# Patient Record
Sex: Female | Born: 1980 | Race: White | Hispanic: No | Smoking: Former smoker
Health system: Southern US, Community
[De-identification: ages and names within clinical notes are randomized; demographics above are authoritative.]

## PROBLEM LIST (undated history)

## (undated) DIAGNOSIS — N393 Stress incontinence (female) (male): Secondary | ICD-10-CM

## (undated) DIAGNOSIS — C801 Malignant (primary) neoplasm, unspecified: Secondary | ICD-10-CM

## (undated) DIAGNOSIS — F32A Depression, unspecified: Secondary | ICD-10-CM

## (undated) DIAGNOSIS — Z87442 Personal history of urinary calculi: Secondary | ICD-10-CM

## (undated) DIAGNOSIS — F329 Major depressive disorder, single episode, unspecified: Secondary | ICD-10-CM

## (undated) DIAGNOSIS — F419 Anxiety disorder, unspecified: Secondary | ICD-10-CM

## (undated) DIAGNOSIS — T884XXA Failed or difficult intubation, initial encounter: Secondary | ICD-10-CM

## (undated) DIAGNOSIS — N189 Chronic kidney disease, unspecified: Secondary | ICD-10-CM

## (undated) DIAGNOSIS — E063 Autoimmune thyroiditis: Secondary | ICD-10-CM

## (undated) DIAGNOSIS — G4730 Sleep apnea, unspecified: Secondary | ICD-10-CM

## (undated) DIAGNOSIS — Z8709 Personal history of other diseases of the respiratory system: Secondary | ICD-10-CM

## (undated) DIAGNOSIS — T4145XA Adverse effect of unspecified anesthetic, initial encounter: Secondary | ICD-10-CM

## (undated) DIAGNOSIS — K219 Gastro-esophageal reflux disease without esophagitis: Secondary | ICD-10-CM

## (undated) DIAGNOSIS — Z8701 Personal history of pneumonia (recurrent): Secondary | ICD-10-CM

## (undated) DIAGNOSIS — G43909 Migraine, unspecified, not intractable, without status migrainosus: Secondary | ICD-10-CM

## (undated) DIAGNOSIS — Z9989 Dependence on other enabling machines and devices: Secondary | ICD-10-CM

## (undated) DIAGNOSIS — T8859XA Other complications of anesthesia, initial encounter: Secondary | ICD-10-CM

## (undated) DIAGNOSIS — Z973 Presence of spectacles and contact lenses: Secondary | ICD-10-CM

## (undated) DIAGNOSIS — C73 Malignant neoplasm of thyroid gland: Secondary | ICD-10-CM

## (undated) DIAGNOSIS — G4733 Obstructive sleep apnea (adult) (pediatric): Secondary | ICD-10-CM

## (undated) DIAGNOSIS — E282 Polycystic ovarian syndrome: Secondary | ICD-10-CM

## (undated) DIAGNOSIS — T7840XA Allergy, unspecified, initial encounter: Secondary | ICD-10-CM

## (undated) HISTORY — DX: Depression, unspecified: F32.A

## (undated) HISTORY — DX: Obstructive sleep apnea (adult) (pediatric): G47.33

## (undated) HISTORY — DX: Chronic kidney disease, unspecified: N18.9

## (undated) HISTORY — DX: Allergy, unspecified, initial encounter: T78.40XA

## (undated) HISTORY — DX: Polycystic ovarian syndrome: E28.2

## (undated) HISTORY — DX: Personal history of pneumonia (recurrent): Z87.01

## (undated) HISTORY — DX: Migraine, unspecified, not intractable, without status migrainosus: G43.909

## (undated) HISTORY — DX: Major depressive disorder, single episode, unspecified: F32.9

## (undated) HISTORY — PX: EXTRACORPOREAL SHOCK WAVE LITHOTRIPSY: SHX1557

## (undated) HISTORY — PX: FRACTURE SURGERY: SHX138

## (undated) HISTORY — DX: Sleep apnea, unspecified: G47.30

## (undated) HISTORY — DX: Anxiety disorder, unspecified: F41.9

## (undated) HISTORY — DX: Malignant neoplasm of thyroid gland: C73

## (undated) HISTORY — DX: Autoimmune thyroiditis: E06.3

## (undated) HISTORY — DX: Dependence on other enabling machines and devices: Z99.89

## (undated) HISTORY — PX: TONSILECTOMY, ADENOIDECTOMY, BILATERAL MYRINGOTOMY AND TUBES: SHX2538

---

## 2001-05-26 DIAGNOSIS — E282 Polycystic ovarian syndrome: Secondary | ICD-10-CM

## 2001-05-26 HISTORY — DX: Polycystic ovarian syndrome: E28.2

## 2004-05-31 ENCOUNTER — Ambulatory Visit: Payer: Self-pay

## 2004-06-25 ENCOUNTER — Ambulatory Visit: Payer: Self-pay

## 2004-07-16 ENCOUNTER — Ambulatory Visit: Payer: Self-pay | Admitting: Specialist

## 2004-08-08 ENCOUNTER — Ambulatory Visit: Payer: Self-pay | Admitting: Specialist

## 2004-08-12 ENCOUNTER — Emergency Department: Payer: Self-pay | Admitting: Internal Medicine

## 2004-08-31 ENCOUNTER — Emergency Department: Payer: Self-pay | Admitting: Emergency Medicine

## 2004-09-07 ENCOUNTER — Emergency Department: Payer: Self-pay | Admitting: Unknown Physician Specialty

## 2004-11-05 ENCOUNTER — Ambulatory Visit: Payer: Self-pay

## 2005-02-06 ENCOUNTER — Ambulatory Visit: Payer: Self-pay | Admitting: Obstetrics & Gynecology

## 2005-02-18 ENCOUNTER — Ambulatory Visit: Payer: Self-pay | Admitting: Obstetrics & Gynecology

## 2005-04-08 ENCOUNTER — Observation Stay: Payer: Self-pay | Admitting: Unknown Physician Specialty

## 2005-04-14 ENCOUNTER — Inpatient Hospital Stay: Payer: Self-pay | Admitting: Obstetrics & Gynecology

## 2005-04-22 ENCOUNTER — Ambulatory Visit: Payer: Self-pay | Admitting: Pediatrics

## 2006-08-06 ENCOUNTER — Ambulatory Visit: Payer: Self-pay | Admitting: Allergy

## 2006-11-04 ENCOUNTER — Emergency Department: Payer: Self-pay | Admitting: Emergency Medicine

## 2007-10-14 ENCOUNTER — Ambulatory Visit: Payer: Self-pay | Admitting: Family Medicine

## 2007-12-28 ENCOUNTER — Ambulatory Visit: Payer: Self-pay | Admitting: Family Medicine

## 2008-03-05 ENCOUNTER — Emergency Department: Payer: Self-pay | Admitting: Emergency Medicine

## 2008-03-05 ENCOUNTER — Other Ambulatory Visit: Payer: Self-pay

## 2008-03-07 ENCOUNTER — Ambulatory Visit: Payer: Self-pay | Admitting: Emergency Medicine

## 2008-05-26 DIAGNOSIS — E063 Autoimmune thyroiditis: Secondary | ICD-10-CM

## 2008-05-26 HISTORY — DX: Autoimmune thyroiditis: E06.3

## 2009-02-26 ENCOUNTER — Ambulatory Visit: Payer: Self-pay | Admitting: Physician Assistant

## 2009-07-25 ENCOUNTER — Emergency Department: Payer: Self-pay | Admitting: Emergency Medicine

## 2010-01-16 DIAGNOSIS — K2100 Gastro-esophageal reflux disease with esophagitis, without bleeding: Secondary | ICD-10-CM | POA: Insufficient documentation

## 2010-02-18 DIAGNOSIS — Z8669 Personal history of other diseases of the nervous system and sense organs: Secondary | ICD-10-CM | POA: Insufficient documentation

## 2010-05-26 DIAGNOSIS — Z8701 Personal history of pneumonia (recurrent): Secondary | ICD-10-CM

## 2010-05-26 HISTORY — DX: Personal history of pneumonia (recurrent): Z87.01

## 2010-08-07 ENCOUNTER — Ambulatory Visit: Payer: Self-pay | Admitting: Family Medicine

## 2012-08-21 DIAGNOSIS — Z148 Genetic carrier of other disease: Secondary | ICD-10-CM | POA: Insufficient documentation

## 2012-10-01 ENCOUNTER — Emergency Department: Payer: Self-pay | Admitting: Emergency Medicine

## 2012-10-01 LAB — CBC
HCT: 36.2 % (ref 35.0–47.0)
HGB: 12.4 g/dL (ref 12.0–16.0)
MCH: 30.3 pg (ref 26.0–34.0)
MCHC: 34.3 g/dL (ref 32.0–36.0)
Platelet: 216 10*3/uL (ref 150–440)
RDW: 14.1 % (ref 11.5–14.5)
WBC: 9.6 10*3/uL (ref 3.6–11.0)

## 2012-10-01 LAB — URINALYSIS, COMPLETE
Bilirubin,UR: NEGATIVE
Blood: NEGATIVE
Nitrite: NEGATIVE
RBC,UR: 29 /HPF (ref 0–5)
Specific Gravity: 1.029 (ref 1.003–1.030)
Squamous Epithelial: 3

## 2012-10-01 LAB — US OB LIMITED

## 2012-10-01 LAB — GC/CHLAMYDIA PROBE AMP

## 2012-10-01 LAB — WET PREP, GENITAL

## 2012-10-03 LAB — URINE CULTURE

## 2012-12-31 DIAGNOSIS — K589 Irritable bowel syndrome without diarrhea: Secondary | ICD-10-CM | POA: Insufficient documentation

## 2013-03-03 DIAGNOSIS — F331 Major depressive disorder, recurrent, moderate: Secondary | ICD-10-CM | POA: Insufficient documentation

## 2013-07-25 ENCOUNTER — Ambulatory Visit (INDEPENDENT_AMBULATORY_CARE_PROVIDER_SITE_OTHER): Payer: Managed Care, Other (non HMO) | Admitting: Internal Medicine

## 2013-07-25 ENCOUNTER — Encounter: Payer: Self-pay | Admitting: Internal Medicine

## 2013-07-25 VITALS — BP 118/74 | HR 105 | Temp 99.1°F | Resp 12 | Ht 63.0 in | Wt 310.0 lb

## 2013-07-25 DIAGNOSIS — E282 Polycystic ovarian syndrome: Secondary | ICD-10-CM | POA: Insufficient documentation

## 2013-07-25 DIAGNOSIS — E038 Other specified hypothyroidism: Secondary | ICD-10-CM

## 2013-07-25 DIAGNOSIS — E063 Autoimmune thyroiditis: Secondary | ICD-10-CM

## 2013-07-25 DIAGNOSIS — E041 Nontoxic single thyroid nodule: Secondary | ICD-10-CM | POA: Insufficient documentation

## 2013-07-25 LAB — TSH: TSH: 0.41 u[IU]/mL (ref 0.35–5.50)

## 2013-07-25 LAB — T4, FREE: FREE T4: 1.03 ng/dL (ref 0.60–1.60)

## 2013-07-25 NOTE — Patient Instructions (Signed)
Please return in 6 months. Please stop at the lab. Please join MyChart >> I will send you the labs through there.

## 2013-07-25 NOTE — Progress Notes (Signed)
Patient ID: Linda Welch, female   DOB: [birth year], 33 y.o.   MRN: [id number]   HPI  Linda Welch is a 33 y.o.-year-old female, referred by her PCP, Dr. Rutherford Nail, for evaluation for Hashimoto's hypothyroidism and thyroid nodules. She has seen a previous endocrinologist (Dr Francoise Schaumann) >> would like to change to [facility].  Pt. has been dx with hypothyroidism in 2010; is on Synthroid 88 mcg, taken: - fasting - with water or black coffee - separated by >30 min from b'fast  - separated by >4h from calcium, iron, PPIs, multivitamins   She just had a baby 5 mo ago >> during the pregnancy, she was on Synthroid 112 mcg towards the end of the pregnancy (TSH 0.07) >> 112 >> 100 >> 88 mcg.  I reviewed pt's thyroid tests: 03/29/2013: TSH 0.93, fT4 1.03 02/04/2013 (towards end of pregnancy): TSH 0.12, fT4 0.96 >> Synthroid from 100 to 88 mcg 12/2012: TSH 0.07 >> Synthroid reduced from 112 to 100 mcg  She was first dx with a uninodular goiter in 02/2009 >> 3 cm nodule felt by pt >> seen by PCP >> referred to endo >> uptake and scan >> cold nodule >> FNA: benign (bad experience).   Pt is feeling her R thyroid nodule in neck, + hoarseness, no dysphagia/odynophagia, + SOB with lying down on R side.  Pt describes: - + cold intolerance, but also hot flushes (mainly face) >> not new  - weight gain - + fatigue - ongoing for a long time - + diarrhea / + constipation (has IBS) - no dry skin - + hair falling - post pregnancy - + depression/+ anxiety - not new - + palpitations - improved (<1x a month) - + mm aches and joint pain - shoulder  She does not breast feed.   She has + FH of thyroid disorders in: mother  -goiter. No FH of thyroid cancer.  No h/o radiation tx to head or neck. No recent use of iodine supplements.  ROS: Constitutional: see above Eyes: no blurry vision, no xerophthalmia ENT: + sore throat, + nodules palpated in throat, no dysphagia/odynophagia, + hoarseness Cardiovascular: no  CP/SOB/+ palpitations/no leg swelling Respiratory: no cough/SOB Gastrointestinal: no N/V/D/C Musculoskeletal: no muscle/joint aches Skin: no rashes Neurological: no tremors/numbness/tingling/dizziness, + HAs Psychiatric: + depression/+ anxiety  Past Medical History  Diagnosis Date  . PCOS (polycystic ovarian syndrome) 2003  . Sleep apnea, obstructive   . Depression   . Anxiety   . Allergy   . Hashimoto's thyroiditis 2010   Past Surgical History  Procedure Laterality Date  . Tonsilectomy, adenoidectomy, bilateral myringotomy and tubes      1984 and 1989   History   Social History  . Marital Status: Married    Spouse Name: N/A    Number of Children: 2   Occupational History  . florist   Social History Main Topics  . Smoking status: Quit in 2000  . Smokeless tobacco: No  . Alcohol Use: No  . Drug Use: No   Current Outpatient Rx  Name  Route  Sig  Dispense  Refill  . cetirizine (ZYRTEC) 10 MG tablet   Oral   Take 10 mg by mouth daily.         . metFORMIN (GLUCOPHAGE-XR) 750 MG 24 hr tablet   Oral   Take 750 mg by mouth daily with breakfast.          . montelukast (SINGULAIR) 10 MG tablet   Oral   Take 10 mg  by mouth at bedtime.          . sertraline (ZOLOFT) 50 MG tablet   Oral   Take 50 mg by mouth at bedtime.          Marland Kitchen SYNTHROID 88 MCG tablet   Oral   Take 88 mcg by mouth daily before breakfast.           NKDA  Family History  Problem Relation Age of Onset  . Thyroid disease Mother     thyroid goiter  . Depression Mother   . Hyperlipidemia Mother   . Hypertension Mother   . Heart disease Mother     triple bypass  . Heart disease Father   . Hyperlipidemia Father   . Hypertension Father   . Sleep apnea Father   . Asthma Brother   . Depression Brother   . Heart disease Maternal Grandmother   . Hyperlipidemia Maternal Grandmother   . Heart disease Maternal Grandfather   . Hyperlipidemia Maternal Grandfather   . Heart disease  Paternal Grandmother   . Hyperlipidemia Paternal Grandmother   . Heart disease Paternal Grandfather   . Hyperlipidemia Paternal Grandfather   . Diabetes Paternal Grandfather   . Sleep apnea Paternal Grandfather    PE: BP 118/74  Pulse 105  Temp(Src) 99.1 F (37.3 C) (Oral)  Resp 12  Ht 5\' 3"  (1.6 m)  Wt 310 lb (140.615 kg)  BMI 54.93 kg/m2  SpO2 97% Wt Readings from Last 3 Encounters:  07/25/13 310 lb (140.615 kg)   Constitutional: obese, in NAD Eyes: PERRLA, EOMI, no exophthalmos ENT: moist mucous membranes, + thyromegaly, + large R thyroid nodule, mobile, no cervical lymphadenopathy Cardiovascular: RRR, No MRG Respiratory: CTA B Gastrointestinal: abdomen soft, NT, ND, BS+ Musculoskeletal: no deformities, strength intact in all 4 Skin: moist, warm, no rashes Neurological: no tremor with outstretched hands, DTR normal in all 4  ASSESSMENT: 1. Hashimoto's Hypothyroidism  3. Uninodular goiter - 3 cm R "cold" nodule - s/p benign FNA (Dr Francoise Schaumann)  PLAN:  1. Hypothyroidism Patient with 5 year h/o hypothyroidism, on levothyroxine therapy. She appears euthyroid. - We discussed about correct intake of levothyroxine, fasting, with water, separated by at least 30 minutes from breakfast, and separated by more than 4 hours from calcium, iron, multivitamins, acid reflux medications (PPIs). She does take it correctly. - we'll check thyroid tests today: TSH, free T4 - If these are abnormal, she will need to return in 6-8 weeks for repeat labs - If these are normal, I will see her back in 6 months  2. Thyroid nodule - nodule is large and easily observed on exam - pt is not bothered by it and does not want surgery unless this absolutely needs to be done (e.g. If cancerous or if this is exacerbating her sleep apnea). - will get records of previous thyroid U/S and Uptake and scan - will get a new [place] - she had a bad experience with the previous FNA - very painful and could not move neck  for 3 days >> would like to avoid a new one if possible >> we decided to repeat it only if nodule larger or if appears different on U/S  Office Visit on 07/25/2013  Component Date Value Ref Range Status  . TSH 07/25/2013 0.41  0.35 - 5.50 uIU/mL Final  . Free T4 07/25/2013 1.03  0.60 - 1.60 ng/dL Final   Labs normal, continue current LT4 dose.  Received thyroid U/S report from [facility] (  02/26/2009):  R thyroid lobe: 4.8 x 2.5 x 2.6 cm, containing a 3.1 x 2.6 x 2.2 cm complex, slightly hypoechoic, nodule, situated in the mid pole of the right thyroid lobe.  L thyroid lobe: 4.2 x 1.6 x 1.3 cm. Normal echotexture.  Isthmus: 0.2 cm  08/12/2013 Thyroid U/S for 2015 still pending. I will addend the results when they become available.

## 2013-07-25 NOTE — Progress Notes (Deleted)
Patient ID: Linda Welch, female   DOB: 11-Dec-1980, 33 y.o.   MRN: [id number]  HPI: Linda Welch is a 33 y.o.-year-old female, referred by her PCP, Dr. Derrek Monaco, for management of DM2, non-insulin-dependent, uncontrolled, without complications.  Patient has been diagnosed with diabetes in ***; she has not been on insulin before. Last hemoglobin A1c was: No results found for this basename: HGBA1C    Pt is on a regimen of: - Metformin 1000 mg po bid - Lantus *** units qhs - Novolog *** units tid ac  Pt checks her sugars *** a day and they are: - am:  - 2h after b'fast: - before lunch: - 2h after lunch: - before dinner: - 2h after dinner: - bedtime: - nighttime:  No lows. Lowest sugar was ***; she has hypoglycemia awareness at 70.  Highest sugar was ***.  Pt's meals are: - Breakfast: - Lunch: - Dinner: - Snacks:  - no CKD, last BUN/creatinine:  No results found for this basename: bun, creatinine   - last set of lipids: No results found for this basename: CHOL, HDL, LDLCALC, LDLDIRECT, TRIG, CHOLHDL   - last eye exam was in ***. No DR.  - no numbness and tingling in her feet.  I reviewed her chart and she also has a history of ***.  Pt has FH of DM in ***.  ROS: Constitutional: no weight gain/loss, no fatigue, no subjective hyperthermia/hypothermia Eyes: no blurry vision, no xerophthalmia ENT: no sore throat, no nodules palpated in throat, no dysphagia/odynophagia, no hoarseness Cardiovascular: no CP/SOB/palpitations/leg swelling Respiratory: no cough/SOB Gastrointestinal: no N/V/D/C Musculoskeletal: no muscle/joint aches Skin: no rashes Neurological: no tremors/numbness/tingling/dizziness Psychiatric: no depression/anxiety  PE: BP 118/74  Pulse 105  Temp(Src) 99.1 F (37.3 C) (Oral)  Resp 12  Ht 5\' 3"  (1.6 m)  Wt 310 lb (140.615 kg)  BMI 54.93 kg/m2  SpO2 97% Wt Readings from Last 3 Encounters:  07/25/13 310 lb (140.615 kg)   Constitutional:  overweight, in NAD Eyes: PERRLA, EOMI, no exophthalmos ENT: moist mucous membranes, no thyromegaly, no cervical lymphadenopathy Cardiovascular: RRR, No MRG Respiratory: CTA B Gastrointestinal: abdomen soft, NT, ND, BS+ Musculoskeletal: no deformities, strength intact in all 4 Skin: moist, warm, no rashes Neurological: no tremor with outstretched hands, DTR normal in all 4  ASSESSMENT: 1. DM2, non-insulin-dependent, uncontrolled, without complications  PLAN:  1. Patient with long-standing, recently more uncontrolled diabetes, on oral antidiabetic regimen, which became insufficient - We discussed about options for treatment, and I suggested to:  There are no Patient Instructions on file for this visit. - Strongly advised her to start checking sugars at different times of the day - check 2 times a day, rotating checks - given sugar log and advised how to fill it and to bring it at next appt  - given foot care handout and explained the principles  - given instructions for hypoglycemia management "15-15 rule"  - advised for yearly eye exams - Return to clinic in 1 mo with sugar log

## 2013-08-12 ENCOUNTER — Encounter: Payer: Self-pay | Admitting: Internal Medicine

## 2014-01-23 ENCOUNTER — Encounter: Payer: Self-pay | Admitting: Internal Medicine

## 2014-01-23 ENCOUNTER — Ambulatory Visit (INDEPENDENT_AMBULATORY_CARE_PROVIDER_SITE_OTHER): Payer: Managed Care, Other (non HMO) | Admitting: Internal Medicine

## 2014-01-23 ENCOUNTER — Other Ambulatory Visit: Payer: Self-pay | Admitting: *Deleted

## 2014-01-23 ENCOUNTER — Other Ambulatory Visit: Payer: Self-pay | Admitting: Internal Medicine

## 2014-01-23 VITALS — BP 112/62 | HR 94 | Temp 98.4°F | Resp 12 | Wt 336.0 lb

## 2014-01-23 DIAGNOSIS — E063 Autoimmune thyroiditis: Secondary | ICD-10-CM

## 2014-01-23 DIAGNOSIS — E038 Other specified hypothyroidism: Secondary | ICD-10-CM

## 2014-01-23 DIAGNOSIS — E041 Nontoxic single thyroid nodule: Secondary | ICD-10-CM

## 2014-01-23 LAB — TSH: TSH: 0.18 u[IU]/mL — AB (ref 0.35–4.50)

## 2014-01-23 LAB — T4, FREE: FREE T4: 0.92 ng/dL (ref 0.60–1.60)

## 2014-01-23 NOTE — Patient Instructions (Signed)
Please stop at the lab. You will be called with the appt for the ultrasound. Please come back for a follow-up appointment in 6 months.

## 2014-01-23 NOTE — Progress Notes (Addendum)
Patient ID: Linda Welch, female   DOB: 05-Aug-1980, 33 y.o.   MRN: [id number]  HPI  Linda Welch is a 33 y.o.-year-old female, returning for Hashimoto's hypothyroidism and thyroid nodules. She is here with her husband and her son. Last visit 6 mo ago.  Hypothyroidism: Pt. has been dx with hypothyroidism in 2010; is on Synthroid 88 mcg, taken: - fasting - with water or black coffee - separated by >30 min from b'fast  - separated by >4h from calcium, iron, PPIs, multivitamins   During the pregnancy, she was on Synthroid  DAW 112 mcg towards the end of the pregnancy (TSH 0.07) >> 112 >> 100 >> 88 mcg.  I reviewed pt's thyroid tests: Lab Results  Component Value Date   TSH 0.41 07/25/2013   FREET4 1.03 07/25/2013  03/29/2013: TSH 0.93, fT4 1.03 02/04/2013 (towards end of pregnancy): TSH 0.12, fT4 0.96 >> Synthroid reduced from 100 to 88 mcg 12/2012: TSH 0.07 >> Synthroid reduced from 112 to 100 mcg  Pt describes: - + cold intolerance, but also hot flushes (mainly face) >> not new  - weight gain - + fatigue - ongoing for a long time - + diarrhea  - no dry skin - + hair falling - post pregnancy - +++ depression/+ anxiety - depression worse - resolved palpitations  - + mm aches and joint pain - shoulder  Uninodular goiter  Reviewed hx: - dx in in 02/2009 >> 3 cm nodule felt by pt >> seen by PCP >> referred to endo >> uptake and scan >> cold nodule >> FNA by Dr Francoise Schaumann: benign (bad experience).  - last thyroid U/S was from 2010: 3.1 x 2.6 x 2.2 cm complex, slightly hypoechoic, nodule, situated in the mid pole of the right thyroid lobe.  She missed the appt for the U/S ordered at last visit.  Pt is feeling her R thyroid nodule in neck, + hoarseness, no dysphagia/odynophagia, no SOB.  ROS: Constitutional: see HPI Eyes: + blurry vision, no xerophthalmia ENT: + sore throat, + nodules palpated in throat, no dysphagia/odynophagia, + hoarseness Cardiovascular: no CP/SOB/+  palpitations/no leg swelling Respiratory: no cough/SOB Gastrointestinal: no N/V/+D/no C Musculoskeletal: + both: muscle/joint aches Skin: no rashes, + itching, + hair loss Neurological: no tremors/numbness/tingling/dizziness, + HAs  PE: BP 112/62  Pulse 94  Temp(Src) 98.4 F (36.9 C) (Oral)  Resp 12  Wt 336 lb (152.409 kg)  SpO2 97% Wt Readings from Last 3 Encounters:  01/23/14 336 lb (152.409 kg)  07/25/13 310 lb (140.615 kg)   Constitutional: obese, in NAD Eyes: PERRLA, EOMI, no exophthalmos ENT: moist mucous membranes, + thyromegaly R>L, + large R thyroid nodule, mobile, no cervical lymphadenopathy Cardiovascular: RRR, No MRG Respiratory: CTA B Gastrointestinal: abdomen soft, NT, ND, BS+ Musculoskeletal: no deformities, strength intact in all 4 Skin: moist, warm, no rashes Neurological: no tremor with outstretched hands, DTR normal in all 4  ASSESSMENT: 1. Hashimoto's Hypothyroidism  3. Uninodular goiter - 3 cm R "cold" nodule - thyroid U/S report from [facility] (02/26/2009):  R thyroid lobe: 4.8 x 2.5 x 2.6 cm, containing a 3.1 x 2.6 x 2.2 cm complex, slightly hypoechoic, nodule, situated in the mid pole of the right thyroid lobe.  L thyroid lobe: 4.2 x 1.6 x 1.3 cm. Normal echotexture. Isthmus: 0.2 cm - s/p benign FNA (Dr Francoise Schaumann) - she had a bad experience with the FNA - very painful and could not move neck for 3 days   PLAN:  1. Hypothyroidism  Patient with 5 year h/o hypothyroidism, on levothyroxine (LT4) therapy. She appears euthyroid. - We discussed about correct intake of levothyroxine, fasting, with water, separated by at least 30 minutes from breakfast, and separated by more than 4 hours from calcium, iron, multivitamins, acid reflux medications (PPIs). She does take it correctly. - we'll check thyroid tests today: TSH, free T4 - If these are abnormal, she will need to return in 6-8 weeks for repeat labs - If these are normal, I will see her back  in 6 months - needs refill of Synthroid  2. Thyroid nodule - nodule is large and easily observed on exam - pt feels this may have enlarged, but is not bothered by it and does not want surgery unless this absolutely needs to be done  - we tried to obtain records of the previous FNA by Dr Francoise Schaumann, but could not - at last visit, we discussed to get a new [place] >> we decided to repeat Bx only if nodule larger or if appears different on U/S. She missed her appt in radiology, but agrees to have it rescheduled >> will order again.   Office Visit on 01/23/2014  Component Date Value Ref Range Status  . TSH 01/23/2014 0.18* 0.35 - 4.50 uIU/mL Final  . Free T4 01/23/2014 0.92  0.60 - 1.60 ng/dL Final   Will advise pt to skip LT4 1 day a week >> RTC for labs in 6 weeks.  U/S pending >> I will addend the results when they become available.  CLINICAL DATA: Followup nodule  EXAM: THYROID ULTRASOUND  TECHNIQUE: Ultrasound examination of the thyroid gland and adjacent soft tissues was performed.  COMPARISON: 02/26/2009 report only  FINDINGS: Right thyroid lobe  Measurements: 5.4 x 2.7 x 4.1 cm. Large nodule occupying most of the right lobe measures 5.2 x 3.2 x 4.1 cm. There are some internal calcifications. It previously measured 3.1 x 2.6 x 2.2 cm based on the prior report.  Left thyroid lobe  Measurements: 5.1 x 1.4 x 1.7 cm. No nodules visualized.  Isthmus  Thickness: 5 mm. No nodules visualized.  Lymphadenopathy  None visualized.  IMPRESSION: Enlarging right lobe nodule. Findings meet consensus criteria for biopsy. Ultrasound-guided fine needle aspiration should be considered, as per the consensus statement: Management of Thyroid Nodules Detected at [place]: Society of Radiologists in [place]. Radiology 2005; [id number].   Electronically Signed By: Maryclare Bean M.D. On: 02/06/2014 17:02  Nodule larger than in 2010 >> will suggest Bx. Will  order.  Adequacy Reason Satisfactory For Evaluation. 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 COMMENT: THE CASE WAS REVIEWED WITH DR Saralyn Pilar, WHO CONCURS. Mali RUND DO Pathologist, Electronic Signature (Case signed 03/03/2014) Specimen Clinical Information Followup nodule, Prev bx 5 yrs ago of same nodule in [place], Large nodule occupying most of the rt lobe 5.2 x 3.2 x 4.1cm Source Thyroid, Fine Needle Aspiration, Right, (Specimen 1 of 1, collected on 03/02/2014)  Will d/w pt to refer to Sx for hemi +/- total thyroidectomy.

## 2014-01-27 ENCOUNTER — Encounter: Payer: Self-pay | Admitting: Internal Medicine

## 2014-01-27 MED ORDER — LEVOTHYROXINE SODIUM 88 MCG PO TABS: 88.0000 ug | ORAL_TABLET | Freq: Every day | ORAL | Status: DC

## 2014-02-06 ENCOUNTER — Other Ambulatory Visit: Payer: Self-pay | Admitting: Internal Medicine

## 2014-02-06 ENCOUNTER — Ambulatory Visit
Admission: RE | Admit: 2014-02-06 | Discharge: 2014-02-06 | Disposition: A | Discharge status: Home / Self Care | Payer: Managed Care, Other (non HMO) | Source: Ambulatory Visit | Attending: Internal Medicine | Admitting: Internal Medicine

## 2014-02-06 DIAGNOSIS — E041 Nontoxic single thyroid nodule: Secondary | ICD-10-CM

## 2014-02-06 NOTE — Addendum Note (Signed)
Addended by: Philemon Kingdom on: 02/06/2014 06:03 PM   Modules accepted: Orders

## 2014-03-02 ENCOUNTER — Other Ambulatory Visit (HOSPITAL_COMMUNITY)
Admission: RE | Admit: 2014-03-02 | Discharge: 2014-03-02 | Disposition: A | Discharge status: Home / Self Care | Payer: Managed Care, Other (non HMO) | Source: Ambulatory Visit | Attending: Interventional Radiology | Admitting: Interventional Radiology

## 2014-03-02 ENCOUNTER — Ambulatory Visit
Admission: RE | Admit: 2014-03-02 | Discharge: 2014-03-02 | Disposition: A | Discharge status: Home / Self Care | Payer: Managed Care, Other (non HMO) | Source: Ambulatory Visit | Attending: Internal Medicine | Admitting: Internal Medicine

## 2014-03-02 DIAGNOSIS — E041 Nontoxic single thyroid nodule: Secondary | ICD-10-CM | POA: Diagnosis present

## 2014-03-06 ENCOUNTER — Other Ambulatory Visit: Payer: Managed Care, Other (non HMO)

## 2014-03-06 ENCOUNTER — Encounter: Payer: Self-pay | Admitting: Internal Medicine

## 2014-03-06 NOTE — Addendum Note (Signed)
Addended by: Philemon Kingdom on: 03/06/2014 04:39 PM   Modules accepted: Orders

## 2014-03-13 ENCOUNTER — Other Ambulatory Visit: Payer: Managed Care, Other (non HMO)

## 2014-03-27 ENCOUNTER — Ambulatory Visit (INDEPENDENT_AMBULATORY_CARE_PROVIDER_SITE_OTHER): Payer: Self-pay | Admitting: Surgery

## 2014-04-04 ENCOUNTER — Encounter: Payer: Self-pay | Admitting: Internal Medicine

## 2014-04-04 ENCOUNTER — Other Ambulatory Visit: Payer: Self-pay | Admitting: *Deleted

## 2014-04-04 MED ORDER — LEVOTHYROXINE SODIUM 88 MCG PO TABS: 88.0000 ug | ORAL_TABLET | Freq: Every day | ORAL | Status: DC

## 2014-05-10 ENCOUNTER — Encounter (HOSPITAL_COMMUNITY): Payer: Self-pay

## 2014-05-11 ENCOUNTER — Ambulatory Visit (HOSPITAL_COMMUNITY)
Admission: RE | Admit: 2014-05-11 | Discharge: 2014-05-11 | Disposition: A | Discharge status: Home / Self Care | Payer: Managed Care, Other (non HMO) | Source: Ambulatory Visit | Attending: Anesthesiology | Admitting: Anesthesiology

## 2014-05-11 ENCOUNTER — Encounter (HOSPITAL_COMMUNITY): Payer: Self-pay

## 2014-05-11 ENCOUNTER — Encounter (HOSPITAL_COMMUNITY)
Admission: RE | Admit: 2014-05-11 | Discharge: 2014-05-11 | Disposition: A | Discharge status: Home / Self Care | Payer: Managed Care, Other (non HMO) | Source: Ambulatory Visit | Attending: Surgery | Admitting: Surgery

## 2014-05-11 DIAGNOSIS — Z01818 Encounter for other preprocedural examination: Secondary | ICD-10-CM | POA: Diagnosis not present

## 2014-05-11 DIAGNOSIS — Z87891 Personal history of nicotine dependence: Secondary | ICD-10-CM | POA: Diagnosis not present

## 2014-05-11 DIAGNOSIS — Z01812 Encounter for preprocedural laboratory examination: Secondary | ICD-10-CM | POA: Insufficient documentation

## 2014-05-11 DIAGNOSIS — D34 Benign neoplasm of thyroid gland: Secondary | ICD-10-CM

## 2014-05-11 HISTORY — DX: Personal history of urinary calculi: Z87.442

## 2014-05-11 LAB — HCG, SERUM, QUALITATIVE: Preg, Serum: NEGATIVE

## 2014-05-11 LAB — CBC
HCT: 41.1 % (ref 36.0–46.0)
Hemoglobin: 12.9 g/dL (ref 12.0–15.0)
MCH: 28.5 pg (ref 26.0–34.0)
MCHC: 31.4 g/dL (ref 30.0–36.0)
MCV: 90.7 fL (ref 78.0–100.0)
PLATELETS: 286 10*3/uL (ref 150–400)
RBC: 4.53 MIL/uL (ref 3.87–5.11)
RDW: 14.4 % (ref 11.5–15.5)
WBC: 6.4 10*3/uL (ref 4.0–10.5)

## 2014-05-11 LAB — BASIC METABOLIC PANEL
Anion gap: 13 (ref 5–15)
BUN: 11 mg/dL (ref 6–23)
CALCIUM: 9.1 mg/dL (ref 8.4–10.5)
CO2: 25 mEq/L (ref 19–32)
Chloride: 100 mEq/L (ref 96–112)
Creatinine, Ser: 0.58 mg/dL (ref 0.50–1.10)
GFR calc Af Amer: >90 mL/min (ref >90)
Glucose, Bld: 101 mg/dL — ABNORMAL HIGH (ref 70–99)
Potassium: 4.4 mEq/L (ref 3.7–5.3)
Sodium: 138 mEq/L (ref 137–147)

## 2014-05-11 NOTE — Progress Notes (Signed)
Quick Note:  These results are acceptable for scheduled surgery.  Madeliene Tejera M. Ciara Kagan, MD, FACS [facility] Office: [phone]   ______ 

## 2014-05-11 NOTE — Pre-Procedure Instructions (Signed)
05-11-14 CXR done today. 0930 AM -  Dr. Lissa Hoard in to see for preop anesthesia consult. Baribed requested "Amy" Portable equipment.

## 2014-05-11 NOTE — Patient Instructions (Signed)
[place]  05/11/2014   Your procedure is scheduled on:   05-16-2014 Tuesday  Enter through [facility] Entrance and follow signs to [facility]. Arrive at   0700     AM.  Call this number if you have problems the morning of surgery: [phone]  Or Presurgical Testing [phone].   For Living Will and/or Health Care Power Attorney Forms: please provide copy for your medical record,may bring AM of surgery(Forms should be already notarized -we do not provide this service).(05-11-14  No information preferred today).  For Cpap use: Bring mask and tubing only.   Do not eat food/ or drink: After Midnight.      Take these medicines the morning of surgery with A SIP OF WATER: Levothyroxine.   Do not wear jewelry, make-up or nail polish.  Do not wear deodorant, lotions, powders, or perfumes.   Do not shave legs and under arms- 48 hours(2 days) prior to first CHG shower.(Shaving face and neck okay.)  Do not bring valuables to the hospital.(Hospital is not responsible for lost valuables).  Contacts, dentures or removable bridgework, body piercing, hair pins may not be worn into surgery.  Leave suitcase in the car. After surgery it may be brought to your room.  For patients admitted to the hospital, checkout time is 11:00 AM the day of discharge.(Restricted visitors-Any Persons displaying flu-like symptoms or illness).    Patients discharged the day of surgery will not be allowed to drive home. Must have responsible person with you x 24 hours once discharged.  Name and phone number of your driver: Lennette Bihari spouse [phone] cell     Please read over the following fact sheets that you were given:  CHG(Chlorhexidine Gluconate 4% Surgical Soap) use.         [facility] - Preparing for Surgery Before surgery, you can play an important role.  Because skin is not sterile, your skin needs to be as free of germs as possible.  You can reduce the number of germs on your skin  by washing with CHG (chlorahexidine gluconate) soap before surgery.  CHG is an antiseptic cleaner which kills germs and bonds with the skin to continue killing germs even after washing. Please DO NOT use if you have an allergy to CHG or antibacterial soaps.  If your skin becomes reddened/irritated stop using the CHG and inform your nurse when you arrive at Short Stay. Do not shave (including legs and underarms) for at least 48 hours prior to the first CHG shower.  You may shave your face/neck. Please follow these instructions carefully:  1.  Shower with CHG Soap the night before surgery and the  morning of Surgery.  2.  If you choose to wash your hair, wash your hair first as usual with your  normal  shampoo.  3.  After you shampoo, rinse your hair and body thoroughly to remove the  shampoo.                           4.  Use CHG as you would any other liquid soap.  You can apply chg directly  to the skin and wash                       Gently with a scrungie or clean washcloth.  5.  Apply the CHG Soap to your body ONLY FROM THE NECK DOWN.   Do  not use on face/ open                           Wound or open sores. Avoid contact with eyes, ears mouth and genitals (private parts).                       Wash face,  Genitals (private parts) with your normal soap.             6.  Wash thoroughly, paying special attention to the area where your surgery  will be performed.  7.  Thoroughly rinse your body with warm water from the neck down.  8.  DO NOT shower/wash with your normal soap after using and rinsing off  the CHG Soap.                9.  Pat yourself dry with a clean towel.            10.  Wear clean pajamas.            11.  Place clean sheets on your bed the night of your first shower and do not  sleep with pets. Day of Surgery : Do not apply any lotions/deodorants the morning of surgery.  Please wear clean clothes to the hospital/surgery center.  FAILURE TO FOLLOW THESE INSTRUCTIONS MAY RESULT IN  THE CANCELLATION OF YOUR SURGERY PATIENT SIGNATURE_________________________________  NURSE SIGNATURE__________________________________  ________________________________________________________________________

## 2014-05-11 NOTE — Anesthesia Preprocedure Evaluation (Signed)
Anesthesia Evaluation  Patient identified by MRN, date of birth, ID band Patient awake    Reviewed: Allergy & Precautions, H&P , NPO status , Patient's Chart, lab work & pertinent test results  Airway Mallampati: II  TM Distance: >3 FB Neck ROM: Limited    Dental no notable dental hx. (+) Implants, Caps   Pulmonary sleep apnea , former smoker,  breath sounds clear to auscultation  Pulmonary exam normal       Cardiovascular negative cardio ROS  Rhythm:Regular Rate:Normal     Neuro/Psych  Headaches, PSYCHIATRIC DISORDERS Anxiety Depression negative neurological ROS     GI/Hepatic negative GI ROS, Neg liver ROS,   Endo/Other  Hypothyroidism Morbid obesity  Renal/GU negative Renal ROS     Musculoskeletal negative musculoskeletal ROS (+)   Abdominal   Peds  Hematology negative hematology ROS (+)   Anesthesia Other Findings   Reproductive/Obstetrics negative OB ROS                             Anesthesia Physical Anesthesia Plan  ASA: III  Anesthesia Plan: General   Post-op Pain Management:    Induction: Intravenous  Airway Management Planned: Oral ETT  Additional Equipment: None  Intra-op Plan:   Post-operative Plan: Extubation in OR  Informed Consent: I have reviewed the patients History and Physical, chart, labs and discussed the procedure including the risks, benefits and alternatives for the proposed anesthesia with the patient or authorized representative who has indicated his/her understanding and acceptance.   Dental advisory given  Plan Discussed with: CRNA  Anesthesia Plan Comments:         Anesthesia Quick Evaluation

## 2014-05-11 NOTE — Progress Notes (Signed)
Quick Note:  Pre-operative chest x-ray is acceptable for scheduled surgery.  Esparanza Krider M. Philana Younis, MD, FACS [facility] Office: [phone]   ______ 

## 2014-05-15 ENCOUNTER — Encounter (HOSPITAL_COMMUNITY): Payer: Self-pay | Admitting: Surgery

## 2014-05-15 MED ORDER — CEFAZOLIN SODIUM 10 G IJ SOLR
3.0000 g | INTRAMUSCULAR | Status: AC
  Administered 2014-05-16: 3 g via INTRAVENOUS
  Filled 2014-05-15 (×2): qty 3000

## 2014-05-15 NOTE — H&P (Signed)
General Surgery [facility]  Linda Welch DOB: August 03, 1980 Married / Language: English / Race: White Female  History of Present Illness  Patient words: consult. thyroid sx.  The patient is a 33 year old female who presents with a thyroid nodule. Patient is referred by Dr. Philemon Kingdom for evaluation of dominant right thyroid mass with cytologic atypia.  Patient first noted a right thyroid mass approximately 5 years ago on self-examination. She presented to her primary care physician and was followed by an endocrinologist in [place]. Patient underwent fine needle aspiration biopsy of was told that she had Hashimoto's thyroiditis. Over the past 5 years she has had progressive enlargement of the right-sided thyroid nodule. She has been on Synthroid for 5 years although her overall dosage has been decreased. Patient recently changed endocrinologist. Ultrasound performed February 06, 2014 shows a dominant nodule in the right thyroid lobe measuring 5.2 x 3.2 x 4.1 cm and containing internal calcifications. This has significantly enlarged since her prior studies. Left thyroid lobe is normal in size without visualized nodules. Fine-needle aspiration biopsy was performed on March 02, 2014. 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  Patient has no prior history of head or neck surgery. Patient has been on thyroid medication for the past 5 years. There is a family history of thyroid goiter in the patient's mother. There is no family history of other endocrine neoplasm.  Patient does have obstructive sleep apnea. She does note pressure sensation from the right neck and is unable to sleep on her right side.    Other Problems Anxiety Disorder Depression Kidney Stone Migraine Headache Other disease, cancer, significant illness Sleep Apnea Thyroid Disease  Past Surgical History  Oral Surgery Tonsillectomy  Diagnostic  Studies History Colonoscopy never Mammogram >3 years ago Pap Smear 1-5 years ago  Allergies Adhesive Tape *MEDICAL DEVICES* Tioconazole Vaginal *VAGINAL PRODUCTS*  Medication History Montelukast Sodium (10MG  Tablet, Oral) Active. Synthroid (88MCG Tablet, Oral) Active. Sertraline HCl (50MG  Tablet, Oral) Active.  Social History  Alcohol use Remotely quit alcohol use. Caffeine use Coffee, Tea. No drug use Tobacco use Former smoker.  Family History Bleeding disorder Son. Depression Brother, Father, Mother. Heart Disease Father, Mother. Heart disease in female family member before age 38 Heart disease in female family member before age 13 Hypertension Father, Mother. Migraine Headache Mother. Respiratory Condition Mother. Thyroid problems Mother.  Pregnancy / Birth History Age at menarche 52 years. Gravida 2 Irregular periods Maternal age 51-25 Para 2  Review of Systems  General Present- Fatigue and Weight Gain. Not Present- Appetite Loss, Chills, Fever, Night Sweats and Weight Loss. Skin Not Present- Change in Wart/Mole, Dryness, Hives, Jaundice, New Lesions, Non-Healing Wounds, Rash and Ulcer. HEENT Present- Hoarseness, Seasonal Allergies, Sinus Pain and Wears glasses/contact lenses. Not Present- Earache, Hearing Loss, Nose Bleed, Oral Ulcers, Ringing in the Ears, Sore Throat, Visual Disturbances and Yellow Eyes. Respiratory Present- Snoring. Not Present- Bloody sputum, Chronic Cough, Difficulty Breathing and Wheezing. Breast Not Present- Breast Mass, Breast Pain, Nipple Discharge and Skin Changes. Cardiovascular Not Present- Chest Pain, Difficulty Breathing Lying Down, Leg Cramps, Palpitations, Rapid Heart Rate, Shortness of Breath and Swelling of Extremities. Gastrointestinal Not Present- Abdominal Pain, Bloating, Bloody Stool, Change in Bowel Habits, Chronic diarrhea, Constipation, Difficulty Swallowing, Excessive gas, Gets full quickly at meals,  Hemorrhoids, Indigestion, Nausea, Rectal Pain and Vomiting. Female Genitourinary Not Present- Frequency, Nocturia, Painful Urination, Pelvic Pain and Urgency. Neurological Present- Headaches and Tingling. Not Present- Decreased Memory,  Fainting, Numbness, Seizures, Tremor, Trouble walking and Weakness. Psychiatric Present- Anxiety and Depression. Not Present- Bipolar, Change in Sleep Pattern, Fearful and Frequent crying. Endocrine Not Present- Cold Intolerance, Excessive Hunger, Hair Changes, Heat Intolerance, Hot flashes and New Diabetes. Hematology Not Present- Easy Bruising, Excessive bleeding, Gland problems, HIV and Persistent Infections.   Vitals Weight: 340 lb Height: 63in Body Surface Area: 2.62 m Body Mass Index: 60.23 kg/m Temp.: 98.20F  Pulse: 88 (Regular)  BP: 140/80 (Sitting, Left Arm, Standard)    Physical Exam The physical exam findings are as follows: Note:General - appears comfortable, no distress; not diaphorectic  HEENT - normocephalic; sclerae clear, gaze conjugate; mucous membranes moist, dentition good; voice normal  Neck - asymmetric on extension; no palpable anterior or posterior cervical adenopathy; Visibly there is a dominant mass occupying the right thyroid lobe. On palpation there is a smooth mass occupying almost the entire right thyroid lobe measuring at least 5 cm in diameter. This is nontender. There is slight tracheal deviation to the left. 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 We reviewed her ultrasound and her  cytopathology report. I provided her with written literature on thyroid surgery to review at home.  Patient has a dominant mass occupying the right thyroid lobe with significant cytologic atypia on fine needle aspiration biopsy. Mass has significantly enlarged over the past 5 years. There are calcifications present. I believe her risk of malignancy is at least 30%.  I have recommended right thyroid lobectomy for definitive diagnosis. We discussed the possibility of completion thyroidectomy in the event of malignancy. We discussed the risk and benefits of the procedure including the potential for recurrent laryngeal nerve injury and injury to parathyroid glands. We discussed the surgical procedure, the hospital stay, and the postoperative recovery. We discussed the possible need for radioactive iodine treatment in the event of malignancy. Patient understands and wishes to proceed with surgery in the near future.  The risks and benefits of the procedure have been discussed at length with the patient. The patient understands the proposed procedure, potential alternative treatments, and the course of recovery to be expected. All of the patient's questions have been answered at this time. The patient wishes to proceed with surgery.  Earnstine Regal, MD, [facility] Office: [phone]

## 2014-05-16 ENCOUNTER — Encounter (HOSPITAL_COMMUNITY): Payer: Self-pay | Admitting: *Deleted

## 2014-05-16 ENCOUNTER — Observation Stay (HOSPITAL_COMMUNITY)
Admission: RE | Admit: 2014-05-16 | Discharge: 2014-05-17 | Disposition: A | Discharge status: Home / Self Care | Payer: Managed Care, Other (non HMO) | Source: Ambulatory Visit | Attending: Surgery | Admitting: Surgery

## 2014-05-16 ENCOUNTER — Ambulatory Visit (HOSPITAL_COMMUNITY): Payer: Managed Care, Other (non HMO) | Admitting: Anesthesiology

## 2014-05-16 ENCOUNTER — Encounter (HOSPITAL_COMMUNITY)
Admission: RE | Disposition: A | Discharge status: Home / Self Care | Payer: Self-pay | Source: Ambulatory Visit | Attending: Surgery

## 2014-05-16 DIAGNOSIS — Z87442 Personal history of urinary calculi: Secondary | ICD-10-CM | POA: Insufficient documentation

## 2014-05-16 DIAGNOSIS — G4733 Obstructive sleep apnea (adult) (pediatric): Secondary | ICD-10-CM | POA: Insufficient documentation

## 2014-05-16 DIAGNOSIS — D440 Neoplasm of uncertain behavior of thyroid gland: Secondary | ICD-10-CM | POA: Diagnosis present

## 2014-05-16 DIAGNOSIS — Z87891 Personal history of nicotine dependence: Secondary | ICD-10-CM | POA: Diagnosis not present

## 2014-05-16 DIAGNOSIS — G43909 Migraine, unspecified, not intractable, without status migrainosus: Secondary | ICD-10-CM | POA: Diagnosis not present

## 2014-05-16 DIAGNOSIS — F419 Anxiety disorder, unspecified: Secondary | ICD-10-CM | POA: Insufficient documentation

## 2014-05-16 DIAGNOSIS — C73 Malignant neoplasm of thyroid gland: Secondary | ICD-10-CM | POA: Diagnosis not present

## 2014-05-16 DIAGNOSIS — F329 Major depressive disorder, single episode, unspecified: Secondary | ICD-10-CM | POA: Insufficient documentation

## 2014-05-16 HISTORY — PX: THYROID LOBECTOMY: SHX420

## 2014-05-16 SURGERY — LOBECTOMY, THYROID
Anesthesia: General | Site: Abdomen | Laterality: Right

## 2014-05-16 MED ORDER — ROCURONIUM BROMIDE 100 MG/10ML IV SOLN
INTRAVENOUS | Status: AC
  Filled 2014-05-16: qty 1

## 2014-05-16 MED ORDER — GLYCOPYRROLATE 0.2 MG/ML IJ SOLN
INTRAMUSCULAR | Status: DC | PRN
  Administered 2014-05-16 (×2): 0.1 mg via INTRAVENOUS
  Administered 2014-05-16: .6 mg via INTRAVENOUS

## 2014-05-16 MED ORDER — LIDOCAINE HCL (CARDIAC) 20 MG/ML IV SOLN
INTRAVENOUS | Status: AC
  Filled 2014-05-16: qty 5

## 2014-05-16 MED ORDER — ACETAMINOPHEN 325 MG PO TABS
650.0000 mg | ORAL_TABLET | ORAL | Status: DC | PRN
  Administered 2014-05-16: 650 mg via ORAL
  Filled 2014-05-16: qty 2

## 2014-05-16 MED ORDER — SERTRALINE HCL 50 MG PO TABS
50.0000 mg | ORAL_TABLET | Freq: Every day | ORAL | Status: DC
  Administered 2014-05-16: 50 mg via ORAL
  Filled 2014-05-16 (×2): qty 1

## 2014-05-16 MED ORDER — FENTANYL CITRATE 0.05 MG/ML IJ SOLN
INTRAMUSCULAR | Status: DC | PRN
  Administered 2014-05-16: 100 ug via INTRAVENOUS
  Administered 2014-05-16 (×3): 50 ug via INTRAVENOUS
  Administered 2014-05-16: 100 ug via INTRAVENOUS

## 2014-05-16 MED ORDER — NEOSTIGMINE METHYLSULFATE 10 MG/10ML IV SOLN
INTRAVENOUS | Status: DC | PRN
  Administered 2014-05-16: 5 mg via INTRAVENOUS

## 2014-05-16 MED ORDER — HYDROMORPHONE HCL 1 MG/ML IJ SOLN
0.2500 mg | INTRAMUSCULAR | Status: DC | PRN
  Administered 2014-05-16 (×2): 0.5 mg via INTRAVENOUS

## 2014-05-16 MED ORDER — PROMETHAZINE HCL 25 MG/ML IJ SOLN: 6.2500 mg | INTRAMUSCULAR | Status: DC | PRN

## 2014-05-16 MED ORDER — NEOSTIGMINE METHYLSULFATE 10 MG/10ML IV SOLN
INTRAVENOUS | Status: AC
  Filled 2014-05-16: qty 1

## 2014-05-16 MED ORDER — ONDANSETRON HCL 4 MG/2ML IJ SOLN
4.0000 mg | Freq: Four times a day (QID) | INTRAMUSCULAR | Status: DC | PRN
  Administered 2014-05-16: 4 mg via INTRAVENOUS
  Filled 2014-05-16: qty 2

## 2014-05-16 MED ORDER — METOCLOPRAMIDE HCL 5 MG/ML IJ SOLN
INTRAMUSCULAR | Status: DC | PRN
  Administered 2014-05-16: 5 mg via INTRAVENOUS

## 2014-05-16 MED ORDER — GLYCOPYRROLATE 0.2 MG/ML IJ SOLN
INTRAMUSCULAR | Status: AC
  Filled 2014-05-16: qty 1

## 2014-05-16 MED ORDER — PROPOFOL 10 MG/ML IV BOLUS
INTRAVENOUS | Status: AC
  Filled 2014-05-16: qty 20

## 2014-05-16 MED ORDER — FENTANYL CITRATE 0.05 MG/ML IJ SOLN
INTRAMUSCULAR | Status: AC
  Filled 2014-05-16: qty 5

## 2014-05-16 MED ORDER — MONTELUKAST SODIUM 10 MG PO TABS
10.0000 mg | ORAL_TABLET | Freq: Every day | ORAL | Status: DC
Start: 2014-05-16 — End: 2014-05-17
  Administered 2014-05-16: 10 mg via ORAL
  Filled 2014-05-16 (×2): qty 1

## 2014-05-16 MED ORDER — MIDAZOLAM HCL 5 MG/5ML IJ SOLN
INTRAMUSCULAR | Status: DC | PRN
  Administered 2014-05-16: 2 mg via INTRAVENOUS

## 2014-05-16 MED ORDER — FENTANYL CITRATE 0.05 MG/ML IJ SOLN
INTRAMUSCULAR | Status: AC
  Filled 2014-05-16: qty 2

## 2014-05-16 MED ORDER — MIDAZOLAM HCL 2 MG/2ML IJ SOLN
INTRAMUSCULAR | Status: AC
  Filled 2014-05-16: qty 2

## 2014-05-16 MED ORDER — PROPOFOL 10 MG/ML IV BOLUS
INTRAVENOUS | Status: DC | PRN
  Administered 2014-05-16: 30 mg via INTRAVENOUS
  Administered 2014-05-16: 200 mg via INTRAVENOUS

## 2014-05-16 MED ORDER — LACTATED RINGERS IV SOLN
INTRAVENOUS | Status: DC
  Administered 2014-05-16: 1000 mL via INTRAVENOUS
  Administered 2014-05-16: 10:00:00 via INTRAVENOUS

## 2014-05-16 MED ORDER — HYDROMORPHONE HCL 1 MG/ML IJ SOLN
INTRAMUSCULAR | Status: AC
  Filled 2014-05-16: qty 1

## 2014-05-16 MED ORDER — LIDOCAINE HCL (CARDIAC) 20 MG/ML IV SOLN
INTRAVENOUS | Status: DC | PRN
  Administered 2014-05-16: 20 mg via INTRAVENOUS
  Administered 2014-05-16: 80 mg via INTRAVENOUS

## 2014-05-16 MED ORDER — METFORMIN HCL ER 750 MG PO TB24
750.0000 mg | ORAL_TABLET | Freq: Every day | ORAL | Status: DC
  Administered 2014-05-16: 750 mg via ORAL
  Filled 2014-05-16 (×2): qty 1

## 2014-05-16 MED ORDER — ONDANSETRON HCL 4 MG/2ML IJ SOLN
INTRAMUSCULAR | Status: DC | PRN
  Administered 2014-05-16: 4 mg via INTRAVENOUS

## 2014-05-16 MED ORDER — HYDROMORPHONE HCL 1 MG/ML IJ SOLN
1.0000 mg | INTRAMUSCULAR | Status: DC | PRN
  Administered 2014-05-16: 1 mg via INTRAVENOUS
  Filled 2014-05-16: qty 1

## 2014-05-16 MED ORDER — METOCLOPRAMIDE HCL 5 MG/ML IJ SOLN
INTRAMUSCULAR | Status: AC
  Filled 2014-05-16: qty 2

## 2014-05-16 MED ORDER — MEPERIDINE HCL 50 MG/ML IJ SOLN: 6.2500 mg | INTRAMUSCULAR | Status: DC | PRN

## 2014-05-16 MED ORDER — KCL IN DEXTROSE-NACL 30-5-0.45 MEQ/L-%-% IV SOLN
INTRAVENOUS | Status: DC
  Administered 2014-05-16 (×2): via INTRAVENOUS
  Filled 2014-05-16 (×3): qty 1000

## 2014-05-16 MED ORDER — GLYCOPYRROLATE 0.2 MG/ML IJ SOLN
INTRAMUSCULAR | Status: AC
  Filled 2014-05-16: qty 3

## 2014-05-16 MED ORDER — ROCURONIUM BROMIDE 100 MG/10ML IV SOLN
INTRAVENOUS | Status: DC | PRN
  Administered 2014-05-16: 40 mg via INTRAVENOUS
  Administered 2014-05-16: 20 mg via INTRAVENOUS

## 2014-05-16 MED ORDER — ONDANSETRON HCL 4 MG/2ML IJ SOLN
INTRAMUSCULAR | Status: AC
  Filled 2014-05-16: qty 2

## 2014-05-16 MED ORDER — ONDANSETRON HCL 4 MG PO TABS
4.0000 mg | ORAL_TABLET | Freq: Four times a day (QID) | ORAL | Status: DC | PRN
Start: 2014-05-16 — End: 2014-05-17
  Administered 2014-05-17: 4 mg via ORAL
  Filled 2014-05-16: qty 1

## 2014-05-16 MED ORDER — HYDROCODONE-ACETAMINOPHEN 5-325 MG PO TABS
1.0000 | ORAL_TABLET | ORAL | Status: DC | PRN
  Administered 2014-05-16 – 2014-05-17 (×2): 2 via ORAL
  Filled 2014-05-16 (×2): qty 2

## 2014-05-16 MED ORDER — DEXAMETHASONE SODIUM PHOSPHATE 10 MG/ML IJ SOLN
INTRAMUSCULAR | Status: DC | PRN
  Administered 2014-05-16: 10 mg via INTRAVENOUS

## 2014-05-16 SURGICAL SUPPLY — 37 items
ATTRACTOMAT 16X20 MAGNETIC DRP (DRAPES) ×3 IMPLANT
BENZOIN TINCTURE PRP APPL 2/3 (GAUZE/BANDAGES/DRESSINGS) ×3 IMPLANT
BLADE HEX COATED 2.75 (ELECTRODE) ×3 IMPLANT
BLADE SURG 15 STRL LF DISP TIS (BLADE) ×1 IMPLANT
BLADE SURG 15 STRL SS (BLADE) ×2
CHLORAPREP W/TINT 10.5 ML (MISCELLANEOUS) ×3 IMPLANT
CLIP TI MEDIUM 6 (CLIP) ×6 IMPLANT
CLIP TI WIDE RED SMALL 6 (CLIP) ×9 IMPLANT
CLOSURE WOUND 1/2 X4 (GAUZE/BANDAGES/DRESSINGS) ×1
DISSECTOR ROUND CHERRY 3/8 STR (MISCELLANEOUS) IMPLANT
DRAPE PED LAPAROTOMY (DRAPES) ×3 IMPLANT
DRESSING SURGICEL FIBRLLR 1X2 (HEMOSTASIS) ×1 IMPLANT
DRSG SURGICEL FIBRILLAR 1X2 (HEMOSTASIS) ×3
ELECT REM PT RETURN 9FT ADLT (ELECTROSURGICAL) ×3
ELECTRODE REM PT RTRN 9FT ADLT (ELECTROSURGICAL) ×1 IMPLANT
GAUZE SPONGE 4X4 12PLY STRL (GAUZE/BANDAGES/DRESSINGS) ×3 IMPLANT
GAUZE SPONGE 4X4 16PLY XRAY LF (GAUZE/BANDAGES/DRESSINGS) ×3 IMPLANT
GLOVE SURG ORTHO 8.0 STRL STRW (GLOVE) ×3 IMPLANT
GOWN STRL REUS W/TWL LRG LVL3 (GOWN DISPOSABLE) ×3 IMPLANT
GOWN STRL REUS W/TWL XL LVL3 (GOWN DISPOSABLE) ×6 IMPLANT
KIT BASIN OR (CUSTOM PROCEDURE TRAY) ×3 IMPLANT
LIQUID BAND (GAUZE/BANDAGES/DRESSINGS) ×3 IMPLANT
NS IRRIG 1000ML POUR BTL (IV SOLUTION) ×3 IMPLANT
PACK BASIC VI WITH GOWN DISP (CUSTOM PROCEDURE TRAY) ×3 IMPLANT
PENCIL BUTTON HOLSTER BLD 10FT (ELECTRODE) ×3 IMPLANT
SHEARS HARMONIC 9CM CVD (BLADE) ×3 IMPLANT
STAPLER VISISTAT 35W (STAPLE) ×3 IMPLANT
STRIP CLOSURE SKIN 1/2X4 (GAUZE/BANDAGES/DRESSINGS) ×2 IMPLANT
SUT MNCRL AB 4-0 PS2 18 (SUTURE) ×3 IMPLANT
SUT SILK 2 0 (SUTURE) ×2
SUT SILK 2-0 18XBRD TIE 12 (SUTURE) ×1 IMPLANT
SUT SILK 3 0 (SUTURE)
SUT SILK 3-0 18XBRD TIE 12 (SUTURE) IMPLANT
SUT VIC AB 3-0 SH 18 (SUTURE) ×3 IMPLANT
SYR BULB IRRIGATION 50ML (SYRINGE) ×3 IMPLANT
TOWEL OR 17X26 10 PK STRL BLUE (TOWEL DISPOSABLE) ×3 IMPLANT
YANKAUER SUCT BULB TIP 10FT TU (MISCELLANEOUS) ×3 IMPLANT

## 2014-05-16 NOTE — Transfer of Care (Signed)
Immediate Anesthesia Transfer of Care Note  Patient: Linda Welch  Procedure(s) Performed: Procedure(s): RIGHT THYROID LOBECTOMY (Right)  Patient Location: PACU  Anesthesia Type:General  Level of Consciousness: awake, alert , oriented and patient cooperative  Airway & Oxygen Therapy: Patient Spontanous Breathing and Patient connected to face mask oxygen  Post-op Assessment: Report given to PACU RN, Post -op Vital signs reviewed and stable and Patient moving all extremities  Post vital signs: Reviewed and stable  Complications: No apparent anesthesia complications

## 2014-05-16 NOTE — Op Note (Signed)
Linda Welch, Linda Welch               ACCOUNT NO.:  [id number]  MEDICAL RECORD NO.:  [id number]  LOCATION:                                 FACILITY:  PHYSICIAN:  Earnstine Regal, MD      DATE OF BIRTH:  1980-12-12  DATE OF PROCEDURE:  05/16/2014                              OPERATIVE REPORT   PREOPERATIVE DIAGNOSIS:  Right thyroid neoplasm of uncertain behavior.  POSTOPERATIVE DIAGNOSIS:  Right thyroid neoplasm of uncertain behavior.  PROCEDURE:  Right thyroid lobectomy.  SURGEON:  Earnstine Regal, MD, FACS  ANESTHESIA:  General.  ESTIMATED BLOOD LOSS:  Minimal.  PREPARATION:  ChloraPrep.  COMPLICATIONS:  None.  INDICATIONS:  The patient is a 33 year old female referred by her endocrinologist, Dr. Philemon Kingdom, for resection of right thyroid mass with cytologic atypia.  The patient had noted progressive enlargement of the right-sided thyroid nodule over the past several years.  Ultrasound in September, 2015, showed a dominant nodule measuring 5.2 x 3.2 x 4.1 cm, containing internal calcifications.  Fine needle aspiration biopsy in October, 1941, showed a follicular neoplasm with cytologic atypia, but those are class 4.  Patient now comes to Surgery for resection for definitive diagnosis.  BODY OF REPORT:  Procedure was done in OR #11 at the  [facility].  The patient was brought to the operating room, placed in supine position on the operating room table.  Following administration of general anesthesia, the patient was positioned and then prepped and draped in the usual aseptic fashion.  After ascertaining that an adequate level of anesthesia had been achieved, a Kocher incision was made with a #15 blade.  Dissection was carried through subcutaneous tissues and platysma.  Hemostasis was achieved with the electrocautery.  Skin flaps were elevated cephalad and caudad from the thyroid notch to the sternal notch.  The Mahorner self-retaining retractor was  placed for exposure.  Strap muscles were incised in the midline.  Left thyroid lobe was normal to palpation.  Right thyroid lobe was markedly enlarged.  Strap muscles were incised in the midline and reflected towards the right.  The right thyroid lobe was exposed.  It was gently dissected out.  Blunt dissection.  The middle thyroid vein was ligated in continuity with 2-0 silk ties and divided with the Harmonic scalpel.  Superior pole was dissected out and superior pole vessels divided individually between small and medium Ligaclips with the Harmonic scalpel.  Gland was rolled anteriorly.  Care was taken to preserve parathyroid tissue and the recurrent laryngeal nerve.  Inferior venous tributaries were divided between Ligaclips with the Harmonic scalpel.  Branches of the inferior thyroid artery were divided between small Ligaclips with the Harmonic scalpel.  Ligament of Gwenlyn Found was released and the gland was mobilized onto the anterior trachea.  Isthmus was mobilized across the midline.  There was no significant pyramidal lobe present.  The isthmus was transected at its junction with the left thyroid lobe using the Harmonic scalpel.  Specimen was submitted to Pathology for review.  Good hemostasis was achieved throughout the wound.  Neck was irrigated with warm saline.  Fibrillar was placed throughout the operative field.  Strap muscles were  reapproximated with interrupted 3-0 Vicryl sutures.  Platysma was closed with interrupted 3- 0 Vicryl sutures.  Skin was closed with a running 4-0 Monocryl subcuticular suture.  Dermabond was placed on the wound instead of Steri- Strips due to an allergy to adhesive tape.  The patient was awakened from anesthesia and brought to the recovery room.  The patient tolerated the procedure well.   Earnstine Regal, MD, [facility] Office: [phone]   TMG/MEDQ  D:  05/16/2014  T:  05/16/2014  Job:   [id number]  cc:   Philemon Kingdom, M.D. Fax: [phone]

## 2014-05-16 NOTE — Brief Op Note (Signed)
05/16/2014  10:47 AM  PATIENT:  Linda Welch  33 y.o. female  PRE-OPERATIVE DIAGNOSIS:  thyroid neoplasm of uncertain behavior  POST-OPERATIVE DIAGNOSIS:  thyroid neoplasm of uncertain behavior  PROCEDURE:  Procedure(s): RIGHT THYROID LOBECTOMY (Right)  SURGEON:  Surgeon(s) and Role:    * Armandina Gemma, MD - Primary  ANESTHESIA:   general  EBL:  Total I/O In: 1000 [I.V.:1000] Out: -   BLOOD ADMINISTERED:none  DRAINS: none   LOCAL MEDICATIONS USED:  NONE  SPECIMEN:  Excision  DISPOSITION OF SPECIMEN:  PATHOLOGY  COUNTS:  YES  TOURNIQUET:  * No tourniquets in log *  DICTATION: .Other Dictation: Dictation Number [id number]  PLAN OF CARE: Admit for overnight observation  PATIENT DISPOSITION:  PACU - hemodynamically stable.   Delay start of Pharmacological VTE agent (>24hrs) due to surgical blood loss or risk of bleeding: yes  Earnstine Regal, MD, [facility] Office: [phone]

## 2014-05-16 NOTE — Anesthesia Postprocedure Evaluation (Signed)
Anesthesia Post Note  Patient: Linda Welch  Procedure(s) Performed: Procedure(s) (LRB): RIGHT THYROID LOBECTOMY (Right)  Anesthesia type: General  Patient location: PACU  Post pain: Pain level controlled  Post assessment: Post-op Vital signs reviewed  Last Vitals: BP 135/55 mmHg  Pulse 107  Temp(Src) 37.1 C (Oral)  Resp 14  Ht 5\' 3"  (1.6 m)  Wt 340 lb (154.223 kg)  BMI 60.24 kg/m2  SpO2 92%  LMP 05/03/2014 (Exact Date)  Post vital signs: Reviewed  Level of consciousness: sedated  Complications: No apparent anesthesia complications

## 2014-05-16 NOTE — Progress Notes (Signed)
Pt.'s CPAP is set up at the bedside on 16cm H2O per home reg. Via pt.'s home mask (nasal pillows but also covers her mouth). Pt. States that he will place herself on CPAP before going to bed & doesn't need assistance.

## 2014-05-16 NOTE — Interval H&P Note (Signed)
History and Physical Interval Note:  05/16/2014 8:48 AM  Linda Welch L Amis  has presented today for surgery, with the diagnosis of thyroid neoplasm of uncertain behavior.  The various methods of treatment have been discussed with the patient and family. After consideration of risks, benefits and other options for treatment, the patient has consented to    Procedure(s): RIGHT THYROID LOBECTOMY (Right) as a surgical intervention .    The patient's history has been reviewed, patient examined, no change in status, stable for surgery.  I have reviewed the patient's chart and labs.  Questions were answered to the patient's satisfaction.    Earnstine Regal, MD, [facility] Office: [place]

## 2014-05-17 ENCOUNTER — Encounter (HOSPITAL_COMMUNITY): Payer: Self-pay | Admitting: Surgery

## 2014-05-17 DIAGNOSIS — C73 Malignant neoplasm of thyroid gland: Secondary | ICD-10-CM | POA: Diagnosis not present

## 2014-05-17 MED ORDER — OXYCODONE-ACETAMINOPHEN 5-325 MG PO TABS: 1.0000 | ORAL_TABLET | ORAL | Status: DC | PRN

## 2014-05-17 NOTE — Discharge Summary (Signed)
Physician Discharge Summary [facility]  Patient ID: Linda Welch MRN: [id number] DOB/AGE: Sep 13, 1980 33 y.o.  Admit date: 05/16/2014 Discharge date: 05/17/2014  Admission Diagnoses:  Right thyroid neoplasm of uncertain behavior  Discharge Diagnoses:  Principal Problem:   Neoplasm of uncertain behavior of thyroid gland   Discharged Condition: good  Hospital Course: Patient was admitted for observation following thyroid surgery.  Post op course was uncomplicated.  Pain was well controlled.  Tolerated diet.  Patient was prepared for discharge home on POD#1.  Consults: None  Treatments: surgery: right thyroid lobectomy  Discharge Exam: Blood pressure 128/62, pulse 70, temperature 97.7 F (36.5 C), temperature source Oral, resp. rate 16, height 5\' 3"  (1.6 m), weight 340 lb (154.223 kg), last menstrual period 05/03/2014, SpO2 97 %. HEENT - clear Neck - wound dry and intact with minimal STS; voice normal Chest - clear bilaterally Cor - RRR  Disposition: Home  Discharge Instructions    Diet - low sodium heart healthy    Complete by:  As directed      Discharge instructions    Complete by:  As directed   THYROID & PARATHYROID SURGERY - POST OP INSTRUCTIONS  Always review your discharge instruction sheet from the facility where your surgery was performed.  A prescription for pain medication may be given to you upon discharge.  Take your pain medication as prescribed.  If narcotic pain medicine is not needed, then you may take acetaminophen (Tylenol) or ibuprofen (Advil) as needed.  Take your usually prescribed medications unless otherwise directed.  If you need a refill on your pain medication, please contact your pharmacy. They will contact our office to request authorization.  Prescriptions will not be processed after 5 pm or on weekends.  Start with a light diet upon arrival home, such as soup and crackers or toast.  Be sure to drink plenty of fluids  daily.  Resume your normal diet the day after surgery.  Most patients will experience some swelling and bruising on the chest and neck area.  Ice packs will help.  Swelling and bruising can take several days to resolve.   It is common to experience some constipation if taking pain medication after surgery.  Increasing fluid intake and taking a stool softener will usually help or prevent this problem.  A mild laxative (Milk of Magnesia or Miralax) should be taken according to package directions if there are no bowel movements after 48 hours.  You may remove your bandages 24-48 hours after surgery, and you may shower at that time.  You have steri-strips (small skin tapes) in place directly over the incision.  These strips should be left on the skin for 7-10 days and then removed.  You may resume regular (light) daily activities beginning the next day-such as daily self-care, walking, climbing stairs-gradually increasing activities as tolerated.  You may have sexual intercourse when it is comfortable.  Refrain from any heavy lifting or straining until approved by your doctor.  You may drive when you no longer are taking prescription pain medication, you can comfortably wear a seatbelt, and you can safely maneuver your car and apply brakes.  You should see your doctor in the office for a follow-up appointment approximately two to three weeks after your surgery.  Make sure that you call for this appointment within a day or two after you arrive home to insure a convenient appointment time.  WHEN TO CALL YOUR DOCTOR: -- Fever greater than 101.5 -- Inability  to urinate -- Nausea and/or vomiting - persistent -- Extreme swelling or bruising -- Continued bleeding from incision -- Increased pain, redness, or drainage from the incision -- Difficulty swallowing or breathing -- Muscle cramping or spasms -- Numbness or tingling in hands or around lips  The clinic staff is available to answer your questions  during regular business hours.  Please don't hesitate to call and ask to speak to one of the nurses if you have concerns.  Earnstine Regal, MD, [facility] Office: [phone]     Increase activity slowly    Complete by:  As directed      No dressing needed    Complete by:  As directed             Medication List    TAKE these medications        cetirizine 10 MG tablet  Commonly known as:  ZYRTEC  Take 10 mg by mouth daily. At bedtime     ibuprofen 200 MG tablet  Commonly known as:  ADVIL,MOTRIN  Take 400 mg by mouth every 6 (six) hours as needed for headache.     levothyroxine 88 MCG tablet  Commonly known as:  SYNTHROID  Take 1 tablet (88 mcg total) by mouth daily before breakfast. Skip Levothyroxine 1 day a week.     metFORMIN 750 MG 24 hr tablet  Commonly known as:  GLUCOPHAGE-XR  Take 750 mg by mouth daily with breakfast. Takes bedtime     montelukast 10 MG tablet  Commonly known as:  SINGULAIR  Take 10 mg by mouth at bedtime.     oxyCODONE-acetaminophen 5-325 MG per tablet  Commonly known as:  ROXICET  Take 1-2 tablets by mouth every 4 (four) hours as needed for moderate pain.     sertraline 50 MG tablet  Commonly known as:  ZOLOFT  Take 50 mg by mouth at bedtime.           Follow-up Information    Follow up with Earnstine Regal, MD. Schedule an appointment as soon as possible for a visit in 3 weeks.   Specialty:  General Surgery   Why:  For wound re-check   Contact information:   [place] [address] [phone]       Earnstine Regal, MD, [facility] Office: [phone]   Signed: Earnstine Regal 05/17/2014, 7:46 AM

## 2014-05-17 NOTE — Progress Notes (Signed)
Discharge instructions and prescriptions given to patient.  Questions answered

## 2014-05-22 ENCOUNTER — Encounter: Payer: Self-pay | Admitting: Internal Medicine

## 2014-05-22 ENCOUNTER — Telehealth (INDEPENDENT_AMBULATORY_CARE_PROVIDER_SITE_OTHER): Payer: Self-pay

## 2014-05-22 NOTE — Telephone Encounter (Signed)
Pt called to request results of her biopsy of 12/22.  She said she was told you may be able to go through the same incision if more surgery is required.  She is also concerned about her deductible starting over in January.  Please call her with the biopsy results. Thanks

## 2014-05-22 NOTE — Telephone Encounter (Signed)
Pt's husband calling again today for information regarding any further surgery.  Would like to go over bx results also.  Please contact.

## 2014-05-23 ENCOUNTER — Encounter (HOSPITAL_COMMUNITY): Payer: Self-pay | Admitting: *Deleted

## 2014-05-23 ENCOUNTER — Ambulatory Visit (HOSPITAL_BASED_OUTPATIENT_CLINIC_OR_DEPARTMENT_OTHER): Payer: Self-pay | Admitting: Surgery

## 2014-05-31 ENCOUNTER — Encounter (HOSPITAL_COMMUNITY): Payer: Self-pay | Admitting: Surgery

## 2014-05-31 DIAGNOSIS — C73 Malignant neoplasm of thyroid gland: Secondary | ICD-10-CM

## 2014-05-31 HISTORY — DX: Malignant neoplasm of thyroid gland: C73

## 2014-05-31 MED ORDER — DEXTROSE 5 % IV SOLN
3.0000 g | INTRAVENOUS | Status: AC
  Administered 2014-06-01: 3 g via INTRAVENOUS
  Filled 2014-05-31: qty 3000

## 2014-05-31 NOTE — H&P (View-Only) (Signed)
General Surgery [facility]  Linda Welch DOB: 1981/05/23 Married / Language: English / Race: White Female  History of Present Illness  Patient words: consult. thyroid sx.  The patient is a 34 year old female who presents with a thyroid nodule. Patient is referred by Dr. Philemon Kingdom for evaluation of dominant right thyroid mass with cytologic atypia.  Patient first noted a right thyroid mass approximately 5 years ago on self-examination. She presented to her primary care physician and was followed by an endocrinologist in [place]. Patient underwent fine needle aspiration biopsy of was told that she had Hashimoto's thyroiditis. Over the past 5 years she has had progressive enlargement of the right-sided thyroid nodule. She has been on Synthroid for 5 years although her overall dosage has been decreased. Patient recently changed endocrinologist. Ultrasound performed February 06, 2014 shows a dominant nodule in the right thyroid lobe measuring 5.2 x 3.2 x 4.1 cm and containing internal calcifications. This has significantly enlarged since her prior studies. Left thyroid lobe is normal in size without visualized nodules. Fine-needle aspiration biopsy was performed on March 02, 2014. 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  Patient has no prior history of head or neck surgery. Patient has been on thyroid medication for the past 5 years. There is a family history of thyroid goiter in the patient's mother. There is no family history of other endocrine neoplasm.  Patient does have obstructive sleep apnea. She does note pressure sensation from the right neck and is unable to sleep on her right side.    Other Problems Anxiety Disorder Depression Kidney Stone Migraine Headache Other disease, cancer, significant illness Sleep Apnea Thyroid Disease  Past Surgical History  Oral Surgery Tonsillectomy  Diagnostic  Studies History Colonoscopy never Mammogram >3 years ago Pap Smear 1-5 years ago  Allergies Adhesive Tape *MEDICAL DEVICES* Tioconazole Vaginal *VAGINAL PRODUCTS*  Medication History Montelukast Sodium (10MG  Tablet, Oral) Active. Synthroid (88MCG Tablet, Oral) Active. Sertraline HCl (50MG  Tablet, Oral) Active.  Social History  Alcohol use Remotely quit alcohol use. Caffeine use Coffee, Tea. No drug use Tobacco use Former smoker.  Family History Bleeding disorder Son. Depression Brother, Father, Mother. Heart Disease Father, Mother. Heart disease in female family member before age 31 Heart disease in female family member before age 18 Hypertension Father, Mother. Migraine Headache Mother. Respiratory Condition Mother. Thyroid problems Mother.  Pregnancy / Birth History Age at menarche 74 years. Gravida 2 Irregular periods Maternal age 26-25 Para 2  Review of Systems  General Present- Fatigue and Weight Gain. Not Present- Appetite Loss, Chills, Fever, Night Sweats and Weight Loss. Skin Not Present- Change in Wart/Mole, Dryness, Hives, Jaundice, New Lesions, Non-Healing Wounds, Rash and Ulcer. HEENT Present- Hoarseness, Seasonal Allergies, Sinus Pain and Wears glasses/contact lenses. Not Present- Earache, Hearing Loss, Nose Bleed, Oral Ulcers, Ringing in the Ears, Sore Throat, Visual Disturbances and Yellow Eyes. Respiratory Present- Snoring. Not Present- Bloody sputum, Chronic Cough, Difficulty Breathing and Wheezing. Breast Not Present- Breast Mass, Breast Pain, Nipple Discharge and Skin Changes. Cardiovascular Not Present- Chest Pain, Difficulty Breathing Lying Down, Leg Cramps, Palpitations, Rapid Heart Rate, Shortness of Breath and Swelling of Extremities. Gastrointestinal Not Present- Abdominal Pain, Bloating, Bloody Stool, Change in Bowel Habits, Chronic diarrhea, Constipation, Difficulty Swallowing, Excessive gas, Gets full quickly at meals,  Hemorrhoids, Indigestion, Nausea, Rectal Pain and Vomiting. Female Genitourinary Not Present- Frequency, Nocturia, Painful Urination, Pelvic Pain and Urgency. Neurological Present- Headaches and Tingling. Not Present- Decreased Memory,  Fainting, Numbness, Seizures, Tremor, Trouble walking and Weakness. Psychiatric Present- Anxiety and Depression. Not Present- Bipolar, Change in Sleep Pattern, Fearful and Frequent crying. Endocrine Not Present- Cold Intolerance, Excessive Hunger, Hair Changes, Heat Intolerance, Hot flashes and New Diabetes. Hematology Not Present- Easy Bruising, Excessive bleeding, Gland problems, HIV and Persistent Infections.   Vitals Weight: 340 lb Height: 63in Body Surface Area: 2.62 m Body Mass Index: 60.23 kg/m Temp.: 98.78F  Pulse: 88 (Regular)  BP: 140/80 (Sitting, Left Arm, Standard)    Physical Exam The physical exam findings are as follows: Note:General - appears comfortable, no distress; not diaphorectic  HEENT - normocephalic; sclerae clear, gaze conjugate; mucous membranes moist, dentition good; voice normal  Neck - asymmetric on extension; no palpable anterior or posterior cervical adenopathy; Visibly there is a dominant mass occupying the right thyroid lobe. On palpation there is a smooth mass occupying almost the entire right thyroid lobe measuring at least 5 cm in diameter. This is nontender. There is slight tracheal deviation to the left. 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 We reviewed her ultrasound and her  cytopathology report. I provided her with written literature on thyroid surgery to review at home.  Patient has a dominant mass occupying the right thyroid lobe with significant cytologic atypia on fine needle aspiration biopsy. Mass has significantly enlarged over the past 5 years. There are calcifications present. I believe her risk of malignancy is at least 30%.  I have recommended right thyroid lobectomy for definitive diagnosis. We discussed the possibility of completion thyroidectomy in the event of malignancy. We discussed the risk and benefits of the procedure including the potential for recurrent laryngeal nerve injury and injury to parathyroid glands. We discussed the surgical procedure, the hospital stay, and the postoperative recovery. We discussed the possible need for radioactive iodine treatment in the event of malignancy. Patient understands and wishes to proceed with surgery in the near future.  The risks and benefits of the procedure have been discussed at length with the patient. The patient understands the proposed procedure, potential alternative treatments, and the course of recovery to be expected. All of the patient's questions have been answered at this time. The patient wishes to proceed with surgery.  Earnstine Regal, MD, [facility] Office: [phone]

## 2014-05-31 NOTE — Interval H&P Note (Signed)
History and Physical Interval Note:  05/31/2014 10:58 PM  Linda Welch  has presented today for surgery, with the diagnosis of follicular variant of papillary thyroid carcinoma.  The various methods of treatment have been discussed with the patient and family. After consideration of risks, benefits and other options for treatment, the patient has consented to    Procedure(s): COMPLETION THYROIDECTOMY (N/A) as a surgical intervention .    The patient's history has been reviewed, patient examined, no change in status, stable for surgery.  I have reviewed the patient's chart and labs.  Questions were answered to the patient's satisfaction.    Earnstine Regal, MD, [facility] Office: [place]

## 2014-06-01 ENCOUNTER — Ambulatory Visit (HOSPITAL_COMMUNITY): Payer: Managed Care, Other (non HMO) | Admitting: Anesthesiology

## 2014-06-01 ENCOUNTER — Observation Stay (HOSPITAL_COMMUNITY)
Admission: RE | Admit: 2014-06-01 | Discharge: 2014-06-02 | Disposition: A | Discharge status: Home / Self Care | Payer: Managed Care, Other (non HMO) | Source: Ambulatory Visit | Attending: Surgery | Admitting: Surgery

## 2014-06-01 ENCOUNTER — Encounter (HOSPITAL_COMMUNITY)
Admission: RE | Disposition: A | Discharge status: Home / Self Care | Payer: Self-pay | Source: Ambulatory Visit | Attending: Surgery

## 2014-06-01 ENCOUNTER — Encounter (HOSPITAL_COMMUNITY): Payer: Self-pay | Admitting: *Deleted

## 2014-06-01 DIAGNOSIS — G4733 Obstructive sleep apnea (adult) (pediatric): Secondary | ICD-10-CM | POA: Insufficient documentation

## 2014-06-01 DIAGNOSIS — F419 Anxiety disorder, unspecified: Secondary | ICD-10-CM | POA: Diagnosis not present

## 2014-06-01 DIAGNOSIS — F329 Major depressive disorder, single episode, unspecified: Secondary | ICD-10-CM | POA: Insufficient documentation

## 2014-06-01 DIAGNOSIS — Z79899 Other long term (current) drug therapy: Secondary | ICD-10-CM | POA: Insufficient documentation

## 2014-06-01 DIAGNOSIS — Z87891 Personal history of nicotine dependence: Secondary | ICD-10-CM | POA: Insufficient documentation

## 2014-06-01 DIAGNOSIS — Z888 Allergy status to other drugs, medicaments and biological substances status: Secondary | ICD-10-CM | POA: Insufficient documentation

## 2014-06-01 DIAGNOSIS — Z87442 Personal history of urinary calculi: Secondary | ICD-10-CM | POA: Insufficient documentation

## 2014-06-01 DIAGNOSIS — G43909 Migraine, unspecified, not intractable, without status migrainosus: Secondary | ICD-10-CM | POA: Diagnosis not present

## 2014-06-01 DIAGNOSIS — C73 Malignant neoplasm of thyroid gland: Secondary | ICD-10-CM | POA: Diagnosis not present

## 2014-06-01 HISTORY — PX: THYROIDECTOMY: SHX17

## 2014-06-01 LAB — GLUCOSE, CAPILLARY: GLUCOSE-CAPILLARY: 112 mg/dL — AB (ref 70–99)

## 2014-06-01 LAB — HCG, SERUM, QUALITATIVE: PREG SERUM: NEGATIVE

## 2014-06-01 SURGERY — THYROIDECTOMY
Anesthesia: General

## 2014-06-01 MED ORDER — CALCIUM CARBONATE 1250 (500 CA) MG PO TABS
2.0000 | ORAL_TABLET | Freq: Three times a day (TID) | ORAL | Status: DC
  Administered 2014-06-01 – 2014-06-02 (×2): 1000 mg via ORAL
  Filled 2014-06-01 (×5): qty 2

## 2014-06-01 MED ORDER — NEOSTIGMINE METHYLSULFATE 10 MG/10ML IV SOLN
INTRAVENOUS | Status: DC | PRN
  Administered 2014-06-01: 4 mg via INTRAVENOUS

## 2014-06-01 MED ORDER — ROCURONIUM BROMIDE 100 MG/10ML IV SOLN
INTRAVENOUS | Status: AC
Start: 2014-06-01 — End: 2014-06-01
  Filled 2014-06-01: qty 1

## 2014-06-01 MED ORDER — MIDAZOLAM HCL 5 MG/5ML IJ SOLN
INTRAMUSCULAR | Status: DC | PRN
  Administered 2014-06-01: 2 mg via INTRAVENOUS

## 2014-06-01 MED ORDER — DEXAMETHASONE SODIUM PHOSPHATE 10 MG/ML IJ SOLN
INTRAMUSCULAR | Status: AC
  Filled 2014-06-01: qty 1

## 2014-06-01 MED ORDER — LIDOCAINE HCL (CARDIAC) 20 MG/ML IV SOLN
INTRAVENOUS | Status: AC
  Filled 2014-06-01: qty 5

## 2014-06-01 MED ORDER — LEVOTHYROXINE SODIUM 88 MCG PO TABS
88.0000 ug | ORAL_TABLET | Freq: Every day | ORAL | Status: DC
  Administered 2014-06-02: 88 ug via ORAL
  Filled 2014-06-01 (×2): qty 1

## 2014-06-01 MED ORDER — FENTANYL CITRATE 0.05 MG/ML IJ SOLN
INTRAMUSCULAR | Status: AC
  Filled 2014-06-01: qty 2

## 2014-06-01 MED ORDER — ONDANSETRON HCL 4 MG/2ML IJ SOLN: 4.0000 mg | Freq: Once | INTRAMUSCULAR | Status: DC | PRN

## 2014-06-01 MED ORDER — KCL IN DEXTROSE-NACL 20-5-0.45 MEQ/L-%-% IV SOLN
INTRAVENOUS | Status: DC
  Administered 2014-06-01 – 2014-06-02 (×2): via INTRAVENOUS
  Filled 2014-06-01 (×2): qty 1000

## 2014-06-01 MED ORDER — LACTATED RINGERS IV SOLN
INTRAVENOUS | Status: DC | PRN
  Administered 2014-06-01: 10:00:00 via INTRAVENOUS

## 2014-06-01 MED ORDER — FENTANYL CITRATE 0.05 MG/ML IJ SOLN
25.0000 ug | INTRAMUSCULAR | Status: DC | PRN
  Administered 2014-06-01 (×3): 50 ug via INTRAVENOUS

## 2014-06-01 MED ORDER — GLYCOPYRROLATE 0.2 MG/ML IJ SOLN
INTRAMUSCULAR | Status: DC | PRN
  Administered 2014-06-01: 0.6 mg via INTRAVENOUS

## 2014-06-01 MED ORDER — LIDOCAINE HCL (CARDIAC) 20 MG/ML IV SOLN
INTRAVENOUS | Status: DC | PRN
  Administered 2014-06-01: 100 mg via INTRAVENOUS

## 2014-06-01 MED ORDER — GLYCOPYRROLATE 0.2 MG/ML IJ SOLN
INTRAMUSCULAR | Status: AC
  Filled 2014-06-01: qty 3

## 2014-06-01 MED ORDER — ONDANSETRON HCL 4 MG/2ML IJ SOLN: 4.0000 mg | Freq: Four times a day (QID) | INTRAMUSCULAR | Status: DC | PRN

## 2014-06-01 MED ORDER — NEOSTIGMINE METHYLSULFATE 10 MG/10ML IV SOLN
INTRAVENOUS | Status: AC
  Filled 2014-06-01: qty 1

## 2014-06-01 MED ORDER — OXYCODONE-ACETAMINOPHEN 5-325 MG PO TABS
1.0000 | ORAL_TABLET | ORAL | Status: DC | PRN
  Administered 2014-06-02 (×2): 2 via ORAL
  Filled 2014-06-01 (×2): qty 2

## 2014-06-01 MED ORDER — ONDANSETRON HCL 4 MG/2ML IJ SOLN
INTRAMUSCULAR | Status: AC
  Filled 2014-06-01: qty 2

## 2014-06-01 MED ORDER — LACTATED RINGERS IV SOLN
INTRAVENOUS | Status: DC
  Administered 2014-06-01: 13:00:00 via INTRAVENOUS

## 2014-06-01 MED ORDER — FENTANYL CITRATE 0.05 MG/ML IJ SOLN
INTRAMUSCULAR | Status: DC | PRN
  Administered 2014-06-01: 100 ug via INTRAVENOUS
  Administered 2014-06-01 (×3): 50 ug via INTRAVENOUS
  Administered 2014-06-01: 100 ug via INTRAVENOUS

## 2014-06-01 MED ORDER — METOCLOPRAMIDE HCL 5 MG/ML IJ SOLN
INTRAMUSCULAR | Status: DC | PRN
  Administered 2014-06-01: 10 mg via INTRAVENOUS

## 2014-06-01 MED ORDER — METFORMIN HCL ER 750 MG PO TB24
750.0000 mg | ORAL_TABLET | Freq: Every day | ORAL | Status: DC
  Filled 2014-06-01 (×2): qty 1

## 2014-06-01 MED ORDER — DEXAMETHASONE SODIUM PHOSPHATE 10 MG/ML IJ SOLN
INTRAMUSCULAR | Status: DC | PRN
  Administered 2014-06-01: 10 mg via INTRAVENOUS

## 2014-06-01 MED ORDER — FENTANYL CITRATE 0.05 MG/ML IJ SOLN
INTRAMUSCULAR | Status: AC
  Filled 2014-06-01: qty 5

## 2014-06-01 MED ORDER — HYDROMORPHONE HCL 1 MG/ML IJ SOLN
1.0000 mg | INTRAMUSCULAR | Status: DC | PRN
  Administered 2014-06-01 (×3): 1 mg via INTRAVENOUS
  Filled 2014-06-01 (×3): qty 1

## 2014-06-01 MED ORDER — ONDANSETRON HCL 4 MG/2ML IJ SOLN
INTRAMUSCULAR | Status: DC | PRN
  Administered 2014-06-01: 4 mg via INTRAVENOUS

## 2014-06-01 MED ORDER — ROCURONIUM BROMIDE 100 MG/10ML IV SOLN
INTRAVENOUS | Status: DC | PRN
Start: 2014-06-01 — End: 2014-06-01
  Administered 2014-06-01: 40 mg via INTRAVENOUS

## 2014-06-01 MED ORDER — PROPOFOL 10 MG/ML IV BOLUS
INTRAVENOUS | Status: DC | PRN
  Administered 2014-06-01: 200 mg via INTRAVENOUS

## 2014-06-01 MED ORDER — PROPOFOL 10 MG/ML IV BOLUS
INTRAVENOUS | Status: AC
  Filled 2014-06-01: qty 20

## 2014-06-01 MED ORDER — SERTRALINE HCL 100 MG PO TABS
100.0000 mg | ORAL_TABLET | Freq: Every day | ORAL | Status: DC
  Administered 2014-06-01: 100 mg via ORAL
  Filled 2014-06-01 (×2): qty 1

## 2014-06-01 MED ORDER — MIDAZOLAM HCL 2 MG/2ML IJ SOLN
INTRAMUSCULAR | Status: AC
  Filled 2014-06-01: qty 2

## 2014-06-01 MED ORDER — SUCCINYLCHOLINE CHLORIDE 20 MG/ML IJ SOLN
INTRAMUSCULAR | Status: DC | PRN
  Administered 2014-06-01: 160 mg via INTRAVENOUS

## 2014-06-01 MED ORDER — ONDANSETRON HCL 4 MG PO TABS: 4.0000 mg | ORAL_TABLET | Freq: Four times a day (QID) | ORAL | Status: DC | PRN

## 2014-06-01 MED ORDER — MONTELUKAST SODIUM 10 MG PO TABS
10.0000 mg | ORAL_TABLET | Freq: Every day | ORAL | Status: DC
  Administered 2014-06-01: 10 mg via ORAL
  Filled 2014-06-01 (×2): qty 1

## 2014-06-01 SURGICAL SUPPLY — 37 items
ATTRACTOMAT 16X20 MAGNETIC DRP (DRAPES) ×3 IMPLANT
BENZOIN TINCTURE PRP APPL 2/3 (GAUZE/BANDAGES/DRESSINGS) IMPLANT
BLADE HEX COATED 2.75 (ELECTRODE) ×3 IMPLANT
BLADE SURG 15 STRL LF DISP TIS (BLADE) ×1 IMPLANT
BLADE SURG 15 STRL SS (BLADE) ×2
CHLORAPREP W/TINT 26ML (MISCELLANEOUS) ×3 IMPLANT
CLIP TI MEDIUM 6 (CLIP) ×9 IMPLANT
CLIP TI WIDE RED SMALL 6 (CLIP) ×9 IMPLANT
CLOSURE WOUND 1/2 X4 (GAUZE/BANDAGES/DRESSINGS) ×1
DISSECTOR ROUND CHERRY 3/8 STR (MISCELLANEOUS) IMPLANT
DRAPE PED LAPAROTOMY (DRAPES) ×3 IMPLANT
DRESSING SURGICEL FIBRLLR 1X2 (HEMOSTASIS) ×1 IMPLANT
DRSG SURGICEL FIBRILLAR 1X2 (HEMOSTASIS) ×3
ELECT REM PT RETURN 9FT ADLT (ELECTROSURGICAL) ×3
ELECTRODE REM PT RTRN 9FT ADLT (ELECTROSURGICAL) ×1 IMPLANT
GAUZE SPONGE 4X4 12PLY STRL (GAUZE/BANDAGES/DRESSINGS) IMPLANT
GAUZE SPONGE 4X4 16PLY XRAY LF (GAUZE/BANDAGES/DRESSINGS) ×3 IMPLANT
GLOVE SURG ORTHO 8.0 STRL STRW (GLOVE) ×18 IMPLANT
GOWN STRL REUS W/TWL XL LVL3 (GOWN DISPOSABLE) ×9 IMPLANT
KIT BASIN OR (CUSTOM PROCEDURE TRAY) ×3 IMPLANT
LIQUID BAND (GAUZE/BANDAGES/DRESSINGS) ×3 IMPLANT
NS IRRIG 1000ML POUR BTL (IV SOLUTION) ×3 IMPLANT
PACK BASIC VI WITH GOWN DISP (CUSTOM PROCEDURE TRAY) ×3 IMPLANT
PENCIL BUTTON HOLSTER BLD 10FT (ELECTRODE) ×3 IMPLANT
SHEARS HARMONIC 9CM CVD (BLADE) ×3 IMPLANT
STAPLER VISISTAT 35W (STAPLE) ×3 IMPLANT
STRIP CLOSURE SKIN 1/2X4 (GAUZE/BANDAGES/DRESSINGS) ×2 IMPLANT
SUT MNCRL AB 4-0 PS2 18 (SUTURE) ×3 IMPLANT
SUT SILK 2 0 (SUTURE)
SUT SILK 2-0 18XBRD TIE 12 (SUTURE) IMPLANT
SUT SILK 3 0 (SUTURE)
SUT SILK 3-0 18XBRD TIE 12 (SUTURE) IMPLANT
SUT VIC AB 3-0 SH 18 (SUTURE) ×6 IMPLANT
SYR BULB IRRIGATION 50ML (SYRINGE) ×3 IMPLANT
TOWEL OR 17X26 10 PK STRL BLUE (TOWEL DISPOSABLE) ×3 IMPLANT
TOWEL OR NON WOVEN STRL DISP B (DISPOSABLE) ×3 IMPLANT
YANKAUER SUCT BULB TIP 10FT TU (MISCELLANEOUS) ×3 IMPLANT

## 2014-06-01 NOTE — Anesthesia Postprocedure Evaluation (Signed)
  Anesthesia Post-op Note  Patient: Linda Welch  Procedure(s) Performed: Procedure(s) (LRB): COMPLETION THYROIDECTOMY (N/A)  Patient Location: PACU  Anesthesia Type: General  Level of Consciousness: awake and alert   Airway and Oxygen Therapy: Patient Spontanous Breathing  Post-op Pain: mild  Post-op Assessment: Post-op Vital signs reviewed, Patient's Cardiovascular Status Stable, Respiratory Function Stable, Patent Airway and No signs of Nausea or vomiting  Last Vitals:  Filed Vitals:   06/01/14 1300  BP: 145/89  Pulse: 82  Temp:   Resp: 21    Post-op Vital Signs: stable   Complications: No apparent anesthesia complications

## 2014-06-01 NOTE — Brief Op Note (Signed)
06/01/2014  11:44 AM  PATIENT:  Linda Welch  34 y.o. female  PRE-OPERATIVE DIAGNOSIS:  Follicular variant of papillary thyroid carcinoma  POST-OPERATIVE DIAGNOSIS:  same  PROCEDURE:  Procedure(s): COMPLETION THYROIDECTOMY (N/A)  SURGEON:  Surgeon(s) and Role:    * Armandina Gemma, MD - Primary  ANESTHESIA:   general  EBL:     BLOOD ADMINISTERED:none  DRAINS: none   LOCAL MEDICATIONS USED:  NONE  SPECIMEN:  Excision  DISPOSITION OF SPECIMEN:  PATHOLOGY  COUNTS:  YES  TOURNIQUET:  * No tourniquets in log *  DICTATION: .Other Dictation: Dictation Number [id number]  PLAN OF CARE: Admit for overnight observation  PATIENT DISPOSITION:  PACU - hemodynamically stable.   Delay start of Pharmacological VTE agent (>24hrs) due to surgical blood loss or risk of bleeding: yes  Earnstine Regal, MD, [facility] Office: [phone]

## 2014-06-01 NOTE — Progress Notes (Addendum)
Rt set up CPAP for pt. Pt has her own mask and tubing. Pt states she wants the machine set up for when she ready to go on machine. Pt not on machine at this time.

## 2014-06-01 NOTE — Anesthesia Preprocedure Evaluation (Signed)
Anesthesia Evaluation  Patient identified by MRN, date of birth, ID band Patient awake    Reviewed: Allergy & Precautions, NPO status , Patient's Chart, lab work & pertinent test results  History of Anesthesia Complications Negative for: history of anesthetic complications  Airway Mallampati: III  TM Distance: >3 FB Neck ROM: Full  Mouth opening: Limited Mouth Opening  Dental no notable dental hx. (+) Dental Advisory Given   Pulmonary sleep apnea , former smoker,  breath sounds clear to auscultation  Pulmonary exam normal       Cardiovascular negative cardio ROS  Rhythm:Regular Rate:Normal     Neuro/Psych  Headaches, negative neurological ROS  negative psych ROS   GI/Hepatic negative GI ROS, Neg liver ROS,   Endo/Other  Hypothyroidism Morbid obesity  Renal/GU negative Renal ROS  negative genitourinary   Musculoskeletal negative musculoskeletal ROS (+)   Abdominal   Peds negative pediatric ROS (+)  Hematology negative hematology ROS (+)   Anesthesia Other Findings   Reproductive/Obstetrics negative OB ROS                             Anesthesia Physical Anesthesia Plan  ASA: III  Anesthesia Plan: General   Post-op Pain Management:    Induction: Intravenous  Airway Management Planned: Oral ETT  Additional Equipment:   Intra-op Plan:   Post-operative Plan: Extubation in OR  Informed Consent: I have reviewed the patients History and Physical, chart, labs and discussed the procedure including the risks, benefits and alternatives for the proposed anesthesia with the patient or authorized representative who has indicated his/her understanding and acceptance.   Dental advisory given  Plan Discussed with: CRNA  Anesthesia Plan Comments:         Anesthesia Quick Evaluation

## 2014-06-01 NOTE — Transfer of Care (Signed)
**Note De-Identified Linda Obfuscation** Immediate Anesthesia Transfer of Care Note  Patient: Linda Welch  Procedure(s) Performed: Procedure(s) (LRB): COMPLETION THYROIDECTOMY (N/A)  Patient Location: PACU  Anesthesia Type: General  Level of Consciousness: sedated, patient cooperative and responds to stimulation  Airway & Oxygen Therapy: Patient Spontanous Breathing and Patient connected to face mask oxgen  Post-op Assessment: Report given to PACU RN and Post -op Vital signs reviewed and stable  Post vital signs: Reviewed and stable  Complications: No apparent anesthesia complications

## 2014-06-01 NOTE — Anesthesia Procedure Notes (Signed)
Procedure Name: Intubation Date/Time: 06/01/2014 10:30 AM Performed by: Maxwell Caul Pre-anesthesia Checklist: Patient identified, Emergency Drugs available, Suction available and Patient being monitored Patient Re-evaluated:Patient Re-evaluated prior to inductionPreoxygenation: Pre-oxygenation with 100% oxygen Intubation Type: IV induction Ventilation: Mask ventilation without difficulty Tube size: 7.0 mm Number of attempts: 1 Airway Equipment and Method: Patient positioned with wedge pillow,  Video-laryngoscopy and Stylet Placement Confirmation: ETT inserted through vocal cords under direct vision,  positive ETCO2 and breath sounds checked- equal and bilateral Secured at: 21 cm Tube secured with: Tape Dental Injury: Teeth and Oropharynx as per pre-operative assessment  Difficulty Due To: Difficulty was anticipated, Difficult Airway- due to reduced neck mobility and Difficult Airway- due to limited oral opening Future Recommendations: Recommend- induction with short-acting agent, and alternative techniques readily available Comments: Elective Glidescope intubation with #3 due to prior anesthesia record. ETT 7.0 passed with ease. Easy to mask ventilate without need for OA. Recommend use of Glidescope #3 in future.

## 2014-06-01 NOTE — Progress Notes (Signed)
Pt stated that she would self administer CPAP when ready for bed.  Current settings are 16 CMH20 per Pt home settingd via Pt home mask and tubing.  Pt notified to call if she needs any additional assistance.  RT to monitor and assess as needed.

## 2014-06-02 ENCOUNTER — Encounter (HOSPITAL_COMMUNITY): Payer: Self-pay | Admitting: Surgery

## 2014-06-02 DIAGNOSIS — C73 Malignant neoplasm of thyroid gland: Secondary | ICD-10-CM | POA: Diagnosis not present

## 2014-06-02 LAB — BASIC METABOLIC PANEL
Anion gap: 10 (ref 5–15)
BUN: 8 mg/dL (ref 6–23)
CALCIUM: 9 mg/dL (ref 8.4–10.5)
CO2: 26 mmol/L (ref 19–32)
Chloride: 100 mEq/L (ref 96–112)
Creatinine, Ser: 0.57 mg/dL (ref 0.50–1.10)
GFR calc Af Amer: >90 mL/min (ref >90)
GLUCOSE: 131 mg/dL — AB (ref 70–99)
Potassium: 4.3 mmol/L (ref 3.5–5.1)
Sodium: 136 mmol/L (ref 135–145)

## 2014-06-02 LAB — GLUCOSE, CAPILLARY: Glucose-Capillary: 104 mg/dL — ABNORMAL HIGH (ref 70–99)

## 2014-06-02 MED ORDER — CALCIUM CARBONATE 1250 (500 CA) MG PO TABS: 2.0000 | ORAL_TABLET | Freq: Every day | ORAL | Status: DC

## 2014-06-02 MED ORDER — OXYCODONE HCL 5 MG PO TABS: 5.0000 mg | ORAL_TABLET | Freq: Four times a day (QID) | ORAL | Status: DC | PRN

## 2014-06-02 NOTE — Progress Notes (Signed)
Quick Note:  Please contact patient and notify of benign pathology results from second surgery.  Earnstine Regal, MD, [facility] Office: [phone]   ______

## 2014-06-02 NOTE — Discharge Summary (Signed)
Physician Discharge Summary [facility]  Patient ID: Linda Welch MRN: [id number] DOB/AGE: [birth year] 36 y.o.  Admit date: 06/01/2014 Discharge date: 06/02/2014  Admission Diagnoses:  Thyroid carcinoma  Discharge Diagnoses:  Principal Problem:   Papillary carcinoma, follicular variant Active Problems:   Papillary thyroid carcinoma   Discharged Condition: good  Hospital Course: Patient was admitted for observation following thyroid surgery.  Post op course was uncomplicated.  Pain was well controlled.  Tolerated diet.  Post op calcium level on morning following surgery was 9.0 mg/dl.  Patient was prepared for discharge home on POD#1.  Consults: None  Treatments: surgery: completion thyroidectomy  Discharge Exam: Blood pressure 133/69, pulse 75, temperature 97.7 F (36.5 C), temperature source Oral, resp. rate 18, height 5\' 3"  (1.6 m), weight 339 lb 6 oz (153.939 kg), last menstrual period 05/03/2014, SpO2 97 %. HEENT - clear Neck - incision dry and intact with dermabond Chest - clear bilaterally Cor - RRR  Disposition: Home  Discharge Instructions    Diet - low sodium heart healthy    Complete by:  As directed      Discharge instructions    Complete by:  As directed   THYROID & PARATHYROID SURGERY - POST OP INSTRUCTIONS  Always review your discharge instruction sheet from the facility where your surgery was performed.  A prescription for pain medication may be given to you upon discharge.  Take your pain medication as prescribed.  If narcotic pain medicine is not needed, then you may take acetaminophen (Tylenol) or ibuprofen (Advil) as needed.  Take your usually prescribed medications unless otherwise directed.  If you need a refill on your pain medication, please contact your pharmacy. They will contact our office to request authorization.  Prescriptions will not be processed after 5 pm or on weekends.  Start with a light diet upon arrival home,  such as soup and crackers or toast.  Be sure to drink plenty of fluids daily.  Resume your normal diet the day after surgery.  Most patients will experience some swelling and bruising on the chest and neck area.  Ice packs will help.  Swelling and bruising can take several days to resolve.   It is common to experience some constipation if taking pain medication after surgery.  Increasing fluid intake and taking a stool softener will usually help or prevent this problem.  A mild laxative (Milk of Magnesia or Miralax) should be taken according to package directions if there are no bowel movements after 48 hours.  You may remove your bandages 24-48 hours after surgery, and you may shower at that time.  You have steri-strips (small skin tapes) in place directly over the incision.  These strips should be left on the skin for 7-10 days and then removed.  You may resume regular (light) daily activities beginning the next day-such as daily self-care, walking, climbing stairs-gradually increasing activities as tolerated.  You may have sexual intercourse when it is comfortable.  Refrain from any heavy lifting or straining until approved by your doctor.  You may drive when you no longer are taking prescription pain medication, you can comfortably wear a seatbelt, and you can safely maneuver your car and apply brakes.  You should see your doctor in the office for a follow-up appointment approximately two to three weeks after your surgery.  Make sure that you call for this appointment within a day or two after you arrive home to insure a convenient appointment time.  WHEN TO  CALL YOUR DOCTOR: -- Fever greater than 101.5 -- Inability to urinate -- Nausea and/or vomiting - persistent -- Extreme swelling or bruising -- Continued bleeding from incision -- Increased pain, redness, or drainage from the incision -- Difficulty swallowing or breathing -- Muscle cramping or spasms -- Numbness or tingling in hands or  around lips  The clinic staff is available to answer your questions during regular business hours.  Please don't hesitate to call and ask to speak to one of the nurses if you have concerns.  Earnstine Regal, MD, [facility] Office: [phone]     Increase activity slowly    Complete by:  As directed      No dressing needed    Complete by:  As directed             Medication List    TAKE these medications        calcium carbonate 1250 MG tablet  Commonly known as:  OS-CAL - dosed in mg of elemental calcium  Take 2 tablets (1,000 mg of elemental calcium total) by mouth daily with breakfast.     cetirizine 10 MG tablet  Commonly known as:  ZYRTEC  Take 10 mg by mouth daily.     ibuprofen 200 MG tablet  Commonly known as:  ADVIL,MOTRIN  Take 400 mg by mouth every 6 (six) hours as needed for headache.     levothyroxine 88 MCG tablet  Commonly known as:  SYNTHROID  Take 1 tablet (88 mcg total) by mouth daily before breakfast. Skip Levothyroxine 1 day a week.     metFORMIN 750 MG 24 hr tablet  Commonly known as:  GLUCOPHAGE-XR  Take 750 mg by mouth daily with breakfast. Takes bedtime     montelukast 10 MG tablet  Commonly known as:  SINGULAIR  Take 10 mg by mouth at bedtime.     oxyCODONE 5 MG immediate release tablet  Commonly known as:  Oxy IR/ROXICODONE  Take 1-2 tablets (5-10 mg total) by mouth every 6 (six) hours as needed for moderate pain, severe pain or breakthrough pain.     oxyCODONE-acetaminophen 5-325 MG per tablet  Commonly known as:  ROXICET  Take 1-2 tablets by mouth every 4 (four) hours as needed for moderate pain.     sertraline 100 MG tablet  Commonly known as:  ZOLOFT  Take 100 mg by mouth at bedtime.           Follow-up Information    Follow up with Earnstine Regal, MD. Schedule an appointment as soon as possible for a visit in 2 weeks.   Specialty:  General Surgery   Why:  For wound  re-check   Contact information:   [place] [address] [phone]       Earnstine Regal, MD, [facility] Office: [phone]   Signed: Earnstine Regal 06/02/2014, 10:08 AM

## 2014-06-02 NOTE — Op Note (Signed)
Linda Welch, Linda Welch               ACCOUNT NO.:  [id number]  MEDICAL RECORD NO.:  [id number]  LOCATION:  96                         FACILITY:  [facility]  PHYSICIAN:  Earnstine Regal, MD      DATE OF BIRTH:  1981/01/07  DATE OF PROCEDURE:  06/01/2014                               OPERATIVE REPORT   PREOPERATIVE DIAGNOSIS:  Follicular variant of papillary thyroid carcinoma.  POSTOPERATIVE DIAGNOSIS:  Follicular variant of papillary thyroid carcinoma.  PROCEDURE:  Completion thyroidectomy.  SURGEON:  Earnstine Regal, MD, FACS  ANESTHESIA:  General.  ESTIMATED BLOOD LOSS:  Minimal.  PREPARATION:  ChloraPrep.  COMPLICATIONS:  None.  INDICATIONS:  The patient is a 34 year old female who underwent right thyroid lobectomy on May 16, 2014.  This was for a 4.2 cm thyroid nodule with cytologic atypia on fine needle aspiration biopsy.  Final pathology confirmed a follicular variant of papillary thyroid carcinoma. The patient now comes to surgery for completion thyroidectomy.  BODY OF REPORT:  Procedure was done in OR #2 at the [facility].  The patient was brought to the operating room, placed in the supine position on the operating room table.  Following administration of general anesthesia, the patient was positioned and then prepped and draped in usual aseptic fashion.  After ascertaining that an adequate level of anesthesia had been obtained, the patient's previous cervical incision was reopened with a #15 blade.  Dissection was carried through subcutaneous tissues.  A small seroma is evacuated. Skin flaps were elevated cephalad and caudad and a Mahorner self- retaining retractor placed for exposure.  Strap muscles were incised in the midline and previous suture material was extracted.  The left thyroid lobe was identified.  Strap muscles were reflected to the left and the left lobe was gently dissected out with blunt dissection. Middle thyroid veins are  divided between medium Ligaclips with the Harmonic scalpel.  Superior pole vessels were dissected out, divided individually between small and medium Ligaclips with the Harmonic scalpel.  Gland was rolled anteriorly.  Recurrent laryngeal nerve was identified and preserved.  Branches of the inferior thyroid artery were divided between small Ligaclips with the Harmonic scalpel.  Parathyroid tissue was identified and preserved inferiorly.  Gland was rolled anteriorly.  Inferior venous tributaries were divided between Ligaclips. Ligament of Gwenlyn Found was released with the electrocautery and the gland was mobilized onto the anterior trachea from which it was completely excised with the electrocautery.  Left thyroid lobe was submitted to Pathology for review.  Neck was irrigated with warm saline and good hemostasis was achieved with the electrocautery and with small Ligaclips.  Fibrillar was placed throughout the operative field.  Strap muscles were reapproximated in the midline with interrupted 3-0 Vicryl sutures.  Platysma was closed with interrupted 3-0 Vicryl sutures.  Skin was closed with a running 4-0 Monocryl subcuticular suture.  Wound was closed with running 4-0 Monocryl subcuticular suture.  Wound was washed and dried.  Dermabond was applied to the surgical incision.  The patient was awakened from anesthesia and brought to the recovery room.  The patient tolerated the procedure well.   Earnstine Regal, MD, [facility]  Office: [phone]    TMG/MEDQ  D:  06/01/2014  T:  06/02/2014  Job:  [id number]

## 2014-06-02 NOTE — Progress Notes (Signed)
Nurse reviewed discharge instructions with pt.  Pt verbalized understanding of discharge instructions, follow up appointments and new medications.  No concerns at time of discharge. 

## 2014-06-02 NOTE — Progress Notes (Signed)
UR completed 

## 2014-06-05 ENCOUNTER — Encounter: Payer: Self-pay | Admitting: Internal Medicine

## 2014-06-05 ENCOUNTER — Ambulatory Visit: Payer: Managed Care, Other (non HMO) | Admitting: Internal Medicine

## 2014-06-07 ENCOUNTER — Encounter: Payer: Self-pay | Admitting: Internal Medicine

## 2014-06-08 ENCOUNTER — Encounter: Payer: Self-pay | Admitting: Internal Medicine

## 2014-07-11 LAB — HEMOGLOBIN A1C: Hgb A1c MFr Bld: 5 % (ref 4.0–6.0)

## 2014-07-11 LAB — BASIC METABOLIC PANEL: GLUCOSE: 92 mg/dL

## 2014-07-14 ENCOUNTER — Encounter: Payer: Self-pay | Admitting: Internal Medicine

## 2014-10-27 ENCOUNTER — Other Ambulatory Visit: Payer: Self-pay | Admitting: Family Medicine

## 2014-11-06 ENCOUNTER — Encounter: Payer: Self-pay | Admitting: Family Medicine

## 2014-11-06 ENCOUNTER — Ambulatory Visit
Admission: RE | Admit: 2014-11-06 | Discharge: 2014-11-06 | Disposition: A | Discharge status: Home / Self Care | Payer: Managed Care, Other (non HMO) | Source: Ambulatory Visit | Attending: Family Medicine | Admitting: Family Medicine

## 2014-11-06 ENCOUNTER — Ambulatory Visit (INDEPENDENT_AMBULATORY_CARE_PROVIDER_SITE_OTHER): Payer: Managed Care, Other (non HMO) | Admitting: Family Medicine

## 2014-11-06 VITALS — BP 148/78 | HR 101 | Temp 98.0°F | Resp 16 | Ht 62.0 in | Wt 344.0 lb

## 2014-11-06 DIAGNOSIS — R7309 Other abnormal glucose: Secondary | ICD-10-CM

## 2014-11-06 DIAGNOSIS — M25562 Pain in left knee: Secondary | ICD-10-CM | POA: Insufficient documentation

## 2014-11-06 DIAGNOSIS — Z9989 Dependence on other enabling machines and devices: Secondary | ICD-10-CM

## 2014-11-06 DIAGNOSIS — K2100 Gastro-esophageal reflux disease with esophagitis, without bleeding: Secondary | ICD-10-CM

## 2014-11-06 DIAGNOSIS — M79606 Pain in leg, unspecified: Secondary | ICD-10-CM | POA: Diagnosis present

## 2014-11-06 DIAGNOSIS — H6980 Other specified disorders of Eustachian tube, unspecified ear: Secondary | ICD-10-CM | POA: Insufficient documentation

## 2014-11-06 DIAGNOSIS — E890 Postprocedural hypothyroidism: Secondary | ICD-10-CM

## 2014-11-06 DIAGNOSIS — C73 Malignant neoplasm of thyroid gland: Secondary | ICD-10-CM | POA: Diagnosis not present

## 2014-11-06 DIAGNOSIS — G43001 Migraine without aura, not intractable, with status migrainosus: Secondary | ICD-10-CM | POA: Diagnosis not present

## 2014-11-06 DIAGNOSIS — F32A Depression, unspecified: Secondary | ICD-10-CM

## 2014-11-06 DIAGNOSIS — R7303 Prediabetes: Secondary | ICD-10-CM

## 2014-11-06 DIAGNOSIS — G4733 Obstructive sleep apnea (adult) (pediatric): Secondary | ICD-10-CM

## 2014-11-06 DIAGNOSIS — Z9009 Acquired absence of other part of head and neck: Secondary | ICD-10-CM | POA: Insufficient documentation

## 2014-11-06 DIAGNOSIS — M79605 Pain in left leg: Secondary | ICD-10-CM

## 2014-11-06 DIAGNOSIS — E669 Obesity, unspecified: Secondary | ICD-10-CM | POA: Insufficient documentation

## 2014-11-06 DIAGNOSIS — F329 Major depressive disorder, single episode, unspecified: Secondary | ICD-10-CM

## 2014-11-06 DIAGNOSIS — R3129 Other microscopic hematuria: Secondary | ICD-10-CM | POA: Insufficient documentation

## 2014-11-06 NOTE — Patient Instructions (Signed)
Patient to return basic on results.

## 2014-11-06 NOTE — Progress Notes (Signed)
Name: Linda Welch   MRN: [id number]    DOB: [birth year]   Date:11/06/2014       Progress Note  Subjective  Chief Complaint  Chief Complaint  Patient presents with  . Follow-up    Thyroid Problem Presents for follow-up visit. Symptoms include anxiety, cold intolerance, fatigue, leg swelling and palpitations. Patient reports no constipation, diarrhea, tremors or weight loss. The symptoms have been worsening. The treatment provided mild relief. Her past medical history is significant for obesity.  Diabetes She presents for her follow-up diabetic visit. She has type 2 diabetes mellitus. Her disease course has been improving. Hypoglycemia symptoms include nervousness/anxiousness. Pertinent negatives for hypoglycemia include no dizziness, headaches, seizures or tremors. Associated symptoms include fatigue. Pertinent negatives for diabetes include no blurred vision, no chest pain, no weakness and no weight loss. There are no hypoglycemic complications. There are no diabetic complications. Risk factors for coronary artery disease include diabetes mellitus, obesity and sedentary lifestyle. Current diabetic treatment includes oral agent (monotherapy). She rarely participates in exercise. There is no change in her home blood glucose trend.   OBESITY Continues to f/u with endo.   LEG PAIN LLE pain since a fall several weeks ago.  Pain is in popliteal area and just distal in calf region.  No dyspnea or hemoptysis of sob.   Throid cancer S/p surgical procedure for thyroid cancer and is awaiting radioactive iodine tx by endo.    Depression Stable on current medical regimen.          Past Medical History  Diagnosis Date  . PCOS (polycystic ovarian syndrome) 2003  . Depression   . Anxiety   . Allergy   . Hashimoto's thyroiditis 2010  . History of kidney stones     lithotripsy  . Headache     migraines- usually x1 weekly  . Sleep apnea, obstructive     cpap nightly-settings 16     History  Substance Use Topics  . Smoking status: Never Smoker   . Smokeless tobacco: Never Used  . Alcohol Use: No     Current outpatient prescriptions:  .  cetirizine (ZYRTEC) 10 MG tablet, Take 10 mg by mouth daily. , Disp: , Rfl:  .  levothyroxine (SYNTHROID) 88 MCG tablet, Take 1 tablet (88 mcg total) by mouth daily before breakfast. Skip Levothyroxine 1 day a week. (Patient taking differently: Take 88 mcg by mouth daily before breakfast. Skip Levothyroxine 1 day a week.), Disp: 30 tablet, Rfl: 2 .  metFORMIN (GLUCOPHAGE-XR) 750 MG 24 hr tablet, TAKE 1 TABLET ORAL, TWO TIMES DAILY, Disp: 60 tablet, Rfl: 1 .  montelukast (SINGULAIR) 10 MG tablet, TAKE 1 TABLET BY MOUTH DAILY, Disp: 30 tablet, Rfl: 1 .  sertraline (ZOLOFT) 100 MG tablet, TAKE 1 (ONE) TABLET, ORAL, DAILY, Disp: 30 tablet, Rfl: 4  Allergies  Allergen Reactions  . Tioconazole Itching, Swelling and Other (See Comments)    Burning (severe)  . Tape Hives and Swelling    Adhesive tapes    Review of Systems  Constitutional: Positive for malaise/fatigue and fatigue. Negative for fever, chills and weight loss.  HENT: Negative for congestion, hearing loss, sore throat and tinnitus.   Eyes: Negative for blurred vision, double vision and redness.  Respiratory: Positive for cough. Negative for hemoptysis and shortness of breath.   Cardiovascular: Positive for palpitations. Negative for chest pain, orthopnea, claudication and leg swelling.  Gastrointestinal: Negative for heartburn, nausea, vomiting, diarrhea, constipation and blood in stool.  Genitourinary: Negative for dysuria, urgency,  frequency and hematuria.  Musculoskeletal: Positive for joint pain. Negative for myalgias, back pain, falls and neck pain.  Skin: Negative for itching.  Neurological: Negative for dizziness, tingling, tremors, focal weakness, seizures, loss of consciousness, weakness and headaches.  Endo/Heme/Allergies: Positive for cold intolerance. Does  not bruise/bleed easily.  Psychiatric/Behavioral: Positive for depression. Negative for substance abuse. The patient is nervous/anxious. The patient does not have insomnia.      Objective  Filed Vitals:   11/06/14 0747  BP: 148/78  Pulse: 101  Temp: 98 F (36.7 C)  TempSrc: Oral  Resp: 16  Height: 5\' 2"  (1.575 m)  Weight: 344 lb (156.037 kg)  SpO2: 97%     Physical Exam  Constitutional: She is oriented to person, place, and time and well-developed, well-nourished, and in no distress.  HENT:  Head: Normocephalic.  Eyes: EOM are normal. Pupils are equal, round, and reactive to light.  Neck: Thyromegaly (old sx scar ) present.  Cardiovascular: Normal rate and normal heart sounds.   No murmur heard. Sl tachycardia at 103  Pulmonary/Chest: Effort normal and breath sounds normal.  Abdominal: Soft. Bowel sounds are normal.  Musculoskeletal: She exhibits no edema. Tenderness: left knee in popliteal area and lateral calfr area about 6 inches down   Neurological: She is alert and oriented to person, place, and time. No cranial nerve deficit. Gait normal.  Skin: Skin is warm and dry. No rash noted.  Psychiatric: Memory and affect normal.      Assessment & Plan  1. Pain of left lower extremity Will need further w/u  - US Venous Img Lower Unilateral Left; Future - DG Knee Complete 4 Views Left; Future  2. 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 Migraine without aura and with status migrainosus, not intractable stable  4. Obstructive apnea stable  5. Reflux esophagitis stable 6. Prediabetes  Followed by endo  7. Postoperative hypothyroidism Followed by endo 8. Clinical depression stable

## 2014-11-07 NOTE — Progress Notes (Signed)
Patient notified

## 2014-11-08 ENCOUNTER — Ambulatory Visit
Admission: RE | Admit: 2014-11-08 | Discharge: 2014-11-08 | Disposition: A | Discharge status: Home / Self Care | Payer: Managed Care, Other (non HMO) | Source: Ambulatory Visit | Attending: Family Medicine | Admitting: Family Medicine

## 2014-11-08 DIAGNOSIS — M79605 Pain in left leg: Secondary | ICD-10-CM | POA: Insufficient documentation

## 2014-11-27 ENCOUNTER — Other Ambulatory Visit: Payer: Self-pay | Admitting: Family Medicine

## 2014-12-27 ENCOUNTER — Other Ambulatory Visit: Payer: Self-pay | Admitting: Family Medicine

## 2014-12-28 ENCOUNTER — Ambulatory Visit (INDEPENDENT_AMBULATORY_CARE_PROVIDER_SITE_OTHER): Payer: Managed Care, Other (non HMO) | Admitting: Internal Medicine

## 2014-12-28 ENCOUNTER — Encounter: Payer: Self-pay | Admitting: Internal Medicine

## 2014-12-28 VITALS — BP 126/68 | HR 118 | Temp 97.8°F | Resp 14 | Wt 343.0 lb

## 2014-12-28 DIAGNOSIS — C73 Malignant neoplasm of thyroid gland: Secondary | ICD-10-CM | POA: Diagnosis not present

## 2014-12-28 DIAGNOSIS — E063 Autoimmune thyroiditis: Secondary | ICD-10-CM | POA: Diagnosis not present

## 2014-12-28 DIAGNOSIS — E038 Other specified hypothyroidism: Secondary | ICD-10-CM

## 2014-12-28 LAB — T4, FREE: Free T4: 0.66 ng/dL (ref 0.60–1.60)

## 2014-12-28 LAB — TSH: TSH: 26.78 u[IU]/mL — ABNORMAL HIGH (ref 0.35–4.50)

## 2014-12-28 MED ORDER — SYNTHROID 88 MCG PO TABS: 88.0000 ug | ORAL_TABLET | Freq: Every day | ORAL | Status: DC

## 2014-12-28 MED ORDER — SYNTHROID 100 MCG PO TABS: 100.0000 ug | ORAL_TABLET | Freq: Every day | ORAL | Status: DC

## 2014-12-28 NOTE — Patient Instructions (Addendum)
Please stop at the lab.  Please let me know about what you decide about the RAI treatment.  Please come back for a follow-up appointment in 6 months.

## 2014-12-28 NOTE — Progress Notes (Signed)
Patient ID: Linda Welch, female   DOB: 1980-07-22, 34 y.o.   MRN: [id number]  HPI  Linda Welch is a 34 y.o.-year-old female, returning for Hashimoto's hypothyroidism and thyroid nodules. She is here with her husband and her son. Last visit 1 year ago.  She is late 15 minutes for her 15 minute appointment.  Follicular variant of papillary thyroid cancer: - dx in in 02/2009 >> 3 cm nodule felt by pt >> seen by PCP >> referred to endo >> uptake and scan >> cold nodule >> FNA by Dr Francoise Schaumann: benign (bad experience).  - thyroid U/S 2010: 3.1 x 2.6 x 2.2 cm complex, slightly hypoechoic, nodule, situated in the mid pole of the right thyroid lobe - Thyroid ultrasound 02/06/2014: Large nodule occupying most of the right lobe measures 5.2 x 3.2 x 4.1 cm. There are some internal calcifications. It previously measured 3.1 x 2.6 x 2.2 cm based on the prior report. - FNA 58/83/2549: FOLLICULAR NEOPLASM AND/OR LESION (BETHESDA IV)  - Right lobectomy 05/16/2014 and completion thyroidectomy 06/01/2014 Thyroid, lobectomy, right - FOLLICULAR VARIANT OF PAPILLARY THYROID CARCINOMA WITH CAPSULAR INVASION, 4.2 CM, CONFINED WITHIN THYROIDAL TISSUE. - NO EVIDENCE OF ANGIOLYMPHATIC INVASION IDENTIFIED. - RESECTION MARGINS, NEGATIVE FOR ATYPIA OR MALIGNANCY. Microscopic Comment THYROID Specimen: Right thyroid Procedure: Right hemithyroidectomy Specimen Integrity (intact/fragmented): Intact Tumor focality: Unifocal Maximum tumor size (cm): 4.2 cm, gross description Tumor laterality: Right thyroid gland Histologic type (including subtype and/or unique features as applicable): Follicular variant of papillary thyroid carcinoma Tumor capsule: Present Extrathyroidal extension: No Margins: Negative Lymph - Vascular invasion: Not identified Capsular invasion with degree of invasion if present: Present, complete Lymph nodes: # examined N/A; # positive; N/A TNM code: pT3 , pNX Non-neoplastic thyroid:  Unremarkable.  Hypothyroidism: Pt. has been dx with hypothyroidism in 2010; is on Synthroid 88 mcg, taken: - fasting - with water or black coffee - separated by >30 min from b'fast  - separated by >4h from calcium, iron, PPIs, multivitamins   I reviewed pt's thyroid tests: Lab Results  Component Value Date   TSH 0.18* 01/23/2014   TSH 0.41 07/25/2013   FREET4 0.92 01/23/2014   FREET4 1.03 07/25/2013  03/29/2013: TSH 0.93, fT4 1.03 02/04/2013 (towards end of pregnancy): TSH 0.12, fT4 0.96 >> Synthroid reduced from 100 to 88 mcg 12/2012: TSH 0.07 >> Synthroid reduced from 112 to 100 mcg  Pt describes: - no cold intolerance, but also hot flushes (mainly face) >> not new  - + weight gain - + fatigue - ongoing for a long time - + diarrhea  - no dry skin - + hair loss - no palpitation - + mm aches and joint pain - shoulder  Pt is not feeling nodules in neck, + hoarseness, no dysphagia/odynophagia, no SOB with lying down.  ROS: Constitutional: see HPI Eyes: + blurry vision, no xerophthalmia ENT: no sore throat, no nodules palpated in throat, no dysphagia/odynophagia, + hoarseness Cardiovascular: no CP/SOB/+ palpitations/no leg swelling Respiratory: no cough/SOB Gastrointestinal: no N/V/D/C Musculoskeletal: + both: muscle/joint aches Skin: no rashes, + itching, + hair loss Neurological: no tremors/numbness/tingling/dizziness, + HAs  I reviewed pt's medications, allergies, PMH, social hx, family hx, and changes were documented in the history of present illness. Otherwise, unchanged from my initial visit note.  PE: BP 126/68 mmHg  Pulse 118  Temp(Src) 97.8 F (36.6 C) (Oral)  Resp 14  Wt 343 lb (155.584 kg)  SpO2 97% Wt Readings from Last 3 Encounters:  12/28/14 343 lb (155.584  kg)  11/06/14 344 lb (156.037 kg)  05/04/14 345 lb 2 oz (156.548 kg)   Constitutional: obese, in NAD Eyes: PERRLA, EOMI, no exophthalmos ENT: moist mucous membranes, + thyroid scar healed, no  neck masses palpated, no cervical lymphadenopathy Cardiovascular: RRR, No MRG Respiratory: CTA B Gastrointestinal: abdomen soft, NT, ND, BS+ Musculoskeletal: no deformities, strength intact in all 4 Skin: moist, warm, no rashes Neurological: no tremor with outstretched hands, DTR normal in all 4  ASSESSMENT: 1. Follicular variant of PTC  2. Hashimoto's Hypothyroidism  PLAN:  1. PTC - 1. PTC  - she tolerated well her total thyroidectomy - no swelling, pain or erythema - I had a long discussion with the patient about her recent diagnosis of papillary thyroid cancer.  reviewed the pathology  underlined unifocality   Underlined that it was encapsulated  did not spread to the lymph vessels  discussed prognosis >> excellent  discussed further treatment with RAI  - discussed about the recent discussion about qualifying encapsulated Follicular variant of PTC as a benign tumor  - she was aware of this, but also discussed that this is not in the guidelines yet and as of now, RAI tx is still indicated. We can, however, do it with Thyrogen, I believe we can safely avoid thyroid hh withdrawal. I explained why we usually need RAI tx for a stage 1 TNM cancer (low grade) - only for helping followup by Tg measurement.  - she will d/w husband and let me know if she decides for or against RAI tx - we discussed briefly about Rx precautions after RAI tx  - I will see her in 6 mo  2. Hypothyroidism Patient with 6 year h/o Hashimoto's hypothyroidism, on levothyroxine (LT4) therapy. She appears euthyroid. - We discussed about correct intake of levothyroxine, fasting, with water, separated by at least 30 minutes from breakfast, and separated by more than 4 hours from calcium, iron, multivitamins, acid reflux medications (PPIs). She does take it correctly. - we'll check thyroid tests today: TSH, free T4 - If these are abnormal, she will need to return in 6-8 weeks for repeat labs  - If these are  normal, I will see her back in 6 months  - needs refill of Synthroid  Office Visit on 12/28/2014  Component Date Value Ref Range Status  . Free T4 12/28/2014 0.66  0.60 - 1.60 ng/dL Final  . TSH 12/28/2014 26.78* 0.35 - 4.50 uIU/mL Final   Will increase Synthroid to 100 mcg daily and may need further increase after this. Repeat TFTs in 6 weeks.

## 2015-02-19 ENCOUNTER — Ambulatory Visit: Payer: Managed Care, Other (non HMO) | Admitting: Internal Medicine

## 2015-02-27 ENCOUNTER — Other Ambulatory Visit: Payer: Self-pay | Admitting: Family Medicine

## 2015-03-29 ENCOUNTER — Other Ambulatory Visit: Payer: Self-pay | Admitting: Internal Medicine

## 2015-04-23 ENCOUNTER — Other Ambulatory Visit: Payer: Self-pay | Admitting: Family Medicine

## 2015-04-23 ENCOUNTER — Encounter: Payer: Self-pay | Admitting: Family Medicine

## 2015-04-23 ENCOUNTER — Ambulatory Visit (INDEPENDENT_AMBULATORY_CARE_PROVIDER_SITE_OTHER): Payer: Managed Care, Other (non HMO) | Admitting: Family Medicine

## 2015-04-23 ENCOUNTER — Ambulatory Visit
Admission: RE | Admit: 2015-04-23 | Discharge: 2015-04-23 | Disposition: A | Discharge status: Home / Self Care | Payer: Managed Care, Other (non HMO) | Source: Ambulatory Visit | Attending: Family Medicine | Admitting: Family Medicine

## 2015-04-23 VITALS — BP 118/72 | HR 103 | Temp 99.0°F | Resp 18 | Ht 62.0 in | Wt 340.0 lb

## 2015-04-23 DIAGNOSIS — Z23 Encounter for immunization: Secondary | ICD-10-CM

## 2015-04-23 DIAGNOSIS — R1011 Right upper quadrant pain: Secondary | ICD-10-CM | POA: Insufficient documentation

## 2015-04-23 DIAGNOSIS — K760 Fatty (change of) liver, not elsewhere classified: Secondary | ICD-10-CM | POA: Insufficient documentation

## 2015-04-23 DIAGNOSIS — Z8632 Personal history of gestational diabetes: Secondary | ICD-10-CM | POA: Insufficient documentation

## 2015-04-23 DIAGNOSIS — K529 Noninfective gastroenteritis and colitis, unspecified: Secondary | ICD-10-CM

## 2015-04-23 DIAGNOSIS — R197 Diarrhea, unspecified: Secondary | ICD-10-CM

## 2015-04-23 MED ORDER — HYDROCODONE-ACETAMINOPHEN 10-325 MG PO TABS
1.0000 | ORAL_TABLET | Freq: Three times a day (TID) | ORAL | Status: DC | PRN
Start: 2015-04-23 — End: 2015-06-25

## 2015-04-23 MED ORDER — ONDANSETRON HCL 8 MG PO TABS: 8.0000 mg | ORAL_TABLET | Freq: Three times a day (TID) | ORAL | Status: DC | PRN

## 2015-04-23 MED ORDER — OMEPRAZOLE 20 MG PO CPDR: 20.0000 mg | DELAYED_RELEASE_CAPSULE | Freq: Every day | ORAL | Status: DC

## 2015-04-23 NOTE — Progress Notes (Signed)
Name: Linda Welch   MRN: [id number]    DOB: [birth year]   Date:04/23/2015       Progress Note  Subjective  Chief Complaint  Chief Complaint  Patient presents with  . Abdominal Pain  . Diarrhea    pale stools recently    Abdominal Pain This is a new problem. The problem occurs constantly. Associated symptoms include diarrhea, a fever, nausea and vomiting. Pertinent negatives include no constipation, dysuria, frequency, headaches, hematuria, myalgias or weight loss.  Diarrhea  Associated symptoms include abdominal pain, a fever and vomiting. Pertinent negatives include no chills, coughing, headaches, myalgias or weight loss.    Abdominal pain  Patient presents with a one-week history of epigastric right upper quadrant pain. There's been no documented fever or chills. She has associated nausea with occasional vomitus which is not with hematemesis. There is been some diarrhea and since that is resolved some acholic stools. She has a history several years ago of having sludge in her gallbladder while an ultrasound was performed during her pregnancy. This is his second episode of acute abdominal pain since that event. Any foods seem to be pending at this point.   Past Medical History  Diagnosis Date  . PCOS (polycystic ovarian syndrome) 2003  . Depression   . Anxiety   . Allergy   . Hashimoto's thyroiditis 2010  . History of kidney stones     lithotripsy  . Headache     migraines- usually x1 weekly  . Sleep apnea, obstructive     cpap nightly-settings 16    Social History  Substance Use Topics  . Smoking status: Never Smoker   . Smokeless tobacco: Never Used  . Alcohol Use: No     Current outpatient prescriptions:  .  cetirizine (ZYRTEC) 10 MG tablet, Take 10 mg by mouth daily. , Disp: , Rfl:  .  metFORMIN (GLUCOPHAGE-XR) 750 MG 24 hr tablet, TAKE 1 TABLET ORAL, TWO TIMES DAILY, Disp: 60 tablet, Rfl: 1 .  montelukast (SINGULAIR) 10 MG tablet, TAKE 1 TABLET BY MOUTH  DAILY, Disp: 30 tablet, Rfl: 6 .  sertraline (ZOLOFT) 100 MG tablet, TAKE 1 (ONE) TABLET, ORAL, DAILY, Disp: 30 tablet, Rfl: 4 .  SYNTHROID 100 MCG tablet, TAKE 1 TABLET (100 MCG TOTAL) BY MOUTH DAILY BEFORE BREAKFAST., Disp: 45 tablet, Rfl: 0  Allergies  Allergen Reactions  . Tioconazole Itching, Swelling and Other (See Comments)    Burning (severe)  . Tape Hives and Swelling    Adhesive tapes    Review of Systems  Constitutional: Positive for fever. Negative for chills and weight loss.  HENT: Negative for congestion, hearing loss, sore throat and tinnitus.   Eyes: Negative for blurred vision, double vision and redness.  Respiratory: Negative for cough, hemoptysis and shortness of breath.   Cardiovascular: Negative for chest pain, palpitations, orthopnea, claudication and leg swelling.  Gastrointestinal: Positive for nausea, vomiting, abdominal pain and diarrhea. Negative for heartburn, constipation and blood in stool.  Genitourinary: Negative for dysuria, urgency, frequency and hematuria.  Musculoskeletal: Negative for myalgias, back pain, joint pain, falls and neck pain.  Skin: Negative for itching.  Neurological: Negative for dizziness, tingling, tremors, focal weakness, seizures, loss of consciousness, weakness and headaches.  Endo/Heme/Allergies: Does not bruise/bleed easily.  Psychiatric/Behavioral: Positive for depression. Negative for substance abuse. The patient is not nervous/anxious and does not have insomnia.      Objective  Filed Vitals:   04/23/15 1110  BP: 118/72  Pulse: 103  Temp: 99 F (  37.2 C)  Resp: 18  Height: _0  (1.575 m)  Weight: 340 lb (154.223 kg)  SpO2: 96%     Physical Exam  Constitutional: She is oriented to person, place, and time.  Morbidly obese and in no acute distress  HENT:  Head: Normocephalic.  Eyes: EOM are normal. Pupils are equal, round, and reactive to light.  Neck: Normal range of motion. No thyromegaly present.   Cardiovascular: Normal rate, regular rhythm and normal heart sounds.   No murmur heard. Pulmonary/Chest: Effort normal and breath sounds normal.  Abdominal: Soft. Bowel sounds are normal. She exhibits no mass. There is tenderness (in epigastrium and right upper quadrant. Liver edge is not palpable). There is no rebound and no guarding.  Musculoskeletal: Normal range of motion. She exhibits no edema.  Neurological: She is alert and oriented to person, place, and time. No cranial nerve deficit. Gait normal.  Skin: Skin is warm and dry. No rash noted.  Psychiatric: Memory and affect normal.      Assessment & Plan  1. RUQ abdominal pain Likely cholelithiasis in view of her history and exam symptomatology - ondansetron (ZOFRAN) 8 MG tablet; Take 1 tablet (8 mg total) by mouth every 8 (eight) hours as needed for nausea or vomiting.  Dispense: 30 tablet; Refill: 3 - omeprazole (PRILOSEC) 20 MG capsule; Take 1 capsule (20 mg total) by mouth daily.  Dispense: 30 capsule; Refill: 3 - CBC - Comprehensive Metabolic Panel (CMET) - Sed Rate (ESR) - US Abdomen Limited RUQ; Future - ondansetron (ZOFRAN) 8 MG tablet; Take 1 tablet (8 mg total) by mouth every 8 (eight) hours as needed for nausea or vomiting.  Dispense: 30 tablet; Refill: 3 - omeprazole (PRILOSEC) 20 MG capsule; Take 1 capsule (20 mg total) by mouth daily.  Dispense: 30 capsule; Refill: 3 2. Diarrhea in adult patient Now resolving - ondansetron (ZOFRAN) 8 MG tablet; Take 1 tablet (8 mg total) by mouth every 8 (eight) hours as needed for nausea or vomiting.  Dispense: 30 tablet; Refill: 3 - omeprazole (PRILOSEC) 20 MG capsule; Take 1 capsule (20 mg total) by mouth daily.  Dispense: 30 capsule; Refill: 3  3. Need for influenza vaccination Given today - Flu Vaccine QUAD 36+ mos PF IM (Fluarix & Fluzone Quad PF)

## 2015-04-24 LAB — COMPREHENSIVE METABOLIC PANEL
A/G RATIO: 1.6 (ref 1.1–2.5)
ALK PHOS: 108 IU/L (ref 39–117)
ALT: 57 IU/L — AB (ref 0–32)
AST: 30 IU/L (ref 0–40)
Albumin: 4.4 g/dL (ref 3.5–5.5)
BUN/Creatinine Ratio: 15 (ref 8–20)
BUN: 10 mg/dL (ref 6–20)
CHLORIDE: 102 mmol/L (ref 97–106)
CO2: 23 mmol/L (ref 18–29)
Calcium: 7.8 mg/dL — ABNORMAL LOW (ref 8.7–10.2)
Creatinine, Ser: 0.66 mg/dL (ref 0.57–1.00)
GFR calc non Af Amer: 114 mL/min/{1.73_m2} (ref >59)
GFR, EST AFRICAN AMERICAN: 131 mL/min/{1.73_m2} (ref >59)
Globulin, Total: 2.7 g/dL (ref 1.5–4.5)
Glucose: 93 mg/dL (ref 65–99)
POTASSIUM: 4.3 mmol/L (ref 3.5–5.2)
Sodium: 141 mmol/L (ref 136–144)
Total Protein: 7.1 g/dL (ref 6.0–8.5)

## 2015-04-24 LAB — CBC
Hematocrit: 40.1 % (ref 34.0–46.6)
Hemoglobin: 13.3 g/dL (ref 11.1–15.9)
MCH: 29.1 pg (ref 26.6–33.0)
MCHC: 33.2 g/dL (ref 31.5–35.7)
MCV: 88 fL (ref 79–97)
PLATELETS: 278 10*3/uL (ref 150–379)
RBC: 4.57 x10E6/uL (ref 3.77–5.28)
RDW: 14.5 % (ref 12.3–15.4)
WBC: 8.8 10*3/uL (ref 3.4–10.8)

## 2015-04-24 LAB — SEDIMENTATION RATE: Sed Rate: 13 mm/hr (ref 0–32)

## 2015-04-25 ENCOUNTER — Other Ambulatory Visit: Payer: Self-pay

## 2015-04-25 DIAGNOSIS — R1011 Right upper quadrant pain: Secondary | ICD-10-CM

## 2015-04-30 ENCOUNTER — Encounter: Payer: Self-pay | Admitting: Gastroenterology

## 2015-05-22 ENCOUNTER — Other Ambulatory Visit: Payer: Self-pay | Admitting: Internal Medicine

## 2015-06-25 ENCOUNTER — Ambulatory Visit (INDEPENDENT_AMBULATORY_CARE_PROVIDER_SITE_OTHER): Payer: Managed Care, Other (non HMO) | Admitting: Internal Medicine

## 2015-06-25 ENCOUNTER — Encounter: Payer: Self-pay | Admitting: Internal Medicine

## 2015-06-25 VITALS — BP 114/80 | HR 109 | Temp 97.8°F | Resp 12 | Wt 344.6 lb

## 2015-06-25 DIAGNOSIS — E890 Postprocedural hypothyroidism: Secondary | ICD-10-CM | POA: Diagnosis not present

## 2015-06-25 DIAGNOSIS — C73 Malignant neoplasm of thyroid gland: Secondary | ICD-10-CM

## 2015-06-25 LAB — TSH: TSH: 20.51 u[IU]/mL — AB (ref 0.35–4.50)

## 2015-06-25 LAB — T4, FREE: Free T4: 0.68 ng/dL (ref 0.60–1.60)

## 2015-06-25 NOTE — Progress Notes (Signed)
Patient ID: Linda Welch, female   DOB: 1980-11-21, 35 y.o.   MRN: [id number]  HPI  Linda Welch is a 35 y.o.-year-old female, returning for Hashimoto's hypothyroidism and thyroid nodules. She is here with her husband and her son. Last visit 1 year ago.   Follicular variant of papillary thyroid cancer: - dx in in 02/2009 >> 3 cm nodule felt by pt >> seen by PCP >> referred to endo >> uptake and scan >> cold nodule >> FNA by Dr Francoise Schaumann: benign (bad experience).  - thyroid U/S 2010: 3.1 x 2.6 x 2.2 cm complex, slightly hypoechoic, nodule, situated in the mid pole of the right thyroid lobe - Thyroid ultrasound 02/06/2014: Large nodule occupying most of the right lobe measures 5.2 x 3.2 x 4.1 cm. There are some internal calcifications. It previously measured 3.1 x 2.6 x 2.2 cm based on the prior report. - FNA 58/83/2549: FOLLICULAR NEOPLASM AND/OR LESION (BETHESDA IV)  - Right lobectomy 05/16/2014 and completion thyroidectomy 06/01/2014 Thyroid, lobectomy, right - FOLLICULAR VARIANT OF PAPILLARY THYROID CARCINOMA WITH CAPSULAR INVASION, 4.2 CM, CONFINED WITHIN THYROIDAL TISSUE. - NO EVIDENCE OF ANGIOLYMPHATIC INVASION IDENTIFIED. - RESECTION MARGINS, NEGATIVE FOR ATYPIA OR MALIGNANCY. Microscopic Comment THYROID Specimen: Right thyroid Procedure: Right hemithyroidectomy Specimen Integrity (intact/fragmented): Intact Tumor focality: Unifocal Maximum tumor size (cm): 4.2 cm, gross description Tumor laterality: Right thyroid gland Histologic type (including subtype and/or unique features as applicable): Follicular variant of papillary thyroid carcinoma Tumor capsule: Present Extrathyroidal extension: No Margins: Negative Lymph - Vascular invasion: Not identified Capsular invasion with degree of invasion if present: Present, complete Lymph nodes: # examined N/A; # positive; N/A TNM code: pT3 , pNX Non-neoplastic thyroid: Unremarkable.  Hypothyroidism: Pt. has been dx with  hypothyroidism in 2010; is on Synthroid 88 >> 100 mcg, taken: - fasting - with water or black coffee - separated by >30 min from b'fast  - no calcium, iron, multivitamins - + PPIs at night  I reviewed pt's thyroid tests: Lab Results  Component Value Date   TSH 26.78* 12/28/2014   TSH 0.18* 01/23/2014   TSH 0.41 07/25/2013   FREET4 0.66 12/28/2014   FREET4 0.92 01/23/2014   FREET4 1.03 07/25/2013  03/29/2013: TSH 0.93, fT4 1.03 02/04/2013 (towards end of pregnancy): TSH 0.12, fT4 0.96 >> Synthroid reduced from 100 to 88 mcg 12/2012: TSH 0.07 >> Synthroid reduced from 112 to 100 mcg  Pt describes: - no cold intolerance, but also hot flushes (mainly face) >> not new  - + weight gain - + fatigue - ongoing for a long time - + N/V/diarrhea - intermittent - no dry skin - + hair loss - + occasional palpitation - + mm aches and joint pain  - + extremely heavy periods - worse  Pt is not feeling nodules in neck, no hoarseness, no dysphagia/odynophagia, no SOB with lying down.  ROS: Constitutional: see HPI Eyes: + blurry vision, no xerophthalmia ENT: no sore throat, no nodules palpated in throat, no dysphagia/odynophagia, no hoarseness Cardiovascular: no CP/SOB/+ palpitations/no leg swelling Respiratory: no cough/SOB Gastrointestinal: + all: N/V/D Musculoskeletal: + both: muscle/joint aches Skin: no rashes, + hair loss Neurological: no tremors/numbness/tingling/dizziness, + HAs  I reviewed pt's medications, allergies, PMH, social hx, family hx, and changes were documented in the history of present illness. Otherwise, unchanged from my initial visit note.  PE: BP 114/80 mmHg  Pulse 109  Temp(Src) 97.8 F (36.6 C) (Oral)  Resp 12  Wt 344 lb 9.6 oz (156.31 kg)  SpO2 99%  Wt Readings from Last 3 Encounters:  06/25/15 344 lb 9.6 oz (156.31 kg)  04/23/15 340 lb (154.223 kg)  12/28/14 343 lb (155.584 kg)   Constitutional: obese, in NAD Eyes: PERRLA, EOMI, no exophthalmos ENT:  moist mucous membranes, + thyroid scar healed, no neck masses palpated, no cervical lymphadenopathy Cardiovascular: RRR, No MRG Respiratory: CTA B Gastrointestinal: abdomen soft, NT, ND, BS+ Musculoskeletal: no deformities, strength intact in all 4 Skin: moist, warm, no rashes Neurological: no tremor with outstretched hands, DTR normal in all 4  ASSESSMENT: 1. Follicular variant of PTC  2. Hashimoto's Hypothyroidism, now postsurgical ~  PLAN:  1. PTC - she is healing well her total thyroidectomy 1 year ago - no swelling, pain or erythema, dysesthesia - we again discussed about her [facility] papillary thyroid cancer.  reviewed the pathology  underlined unifocality   Underlined that it was encapsulated  did not spread to the lymph vessels  discussed prognosis >> excellent  discussed further treatment with RAI  - at last visit, we discussed about the encapsulated Follicular variant of PTC being qualified as a benign tumor  - she decided to forego the RAI tx for now, since she is stage 1 TNM cancer (low grade) - we will check a thyroid U/S - I will see her in 6 mo  2. Hypothyroidism Patient with now post-op hypothyroidism, on levothyroxine (LT4) therapy. She appears euthyroid, but has increased chronic fatigue and her last TSh was high >> increased LT4 dose to 100 mcg daily >> she takes this now. - We discussed about correct intake of levothyroxine, fasting, with water, separated by at least 30 minutes from breakfast, and separated by more than 4 hours from calcium, iron, multivitamins, acid reflux medications (PPIs). She does take it correctly. - we'll check thyroid tests today: TSH, free T4 - If these are abnormal, she will need to return in 5-6 weeks for repeat labs  - If these are normal, I will see her back in 6 months  - needs refill of Synthroid  Office Visit on 06/25/2015  Component Date Value Ref Range Status  . TSH 06/25/2015 20.51* 0.35 - 4.50 uIU/mL Final  . Free T4  06/25/2015 0.68  0.60 - 1.60 ng/dL Final   TSH still very high, will increase her levothyroxine dose to 125 g and recheck her TFTs in 5-6 weeks.

## 2015-06-25 NOTE — Patient Instructions (Signed)
Please stop at the lab.  Please come back for a follow-up appointment in 6 months.  

## 2015-06-26 MED ORDER — SYNTHROID 125 MCG PO TABS: 125.0000 ug | ORAL_TABLET | Freq: Every day | ORAL | Status: DC

## 2015-07-02 ENCOUNTER — Other Ambulatory Visit: Payer: Managed Care, Other (non HMO)

## 2015-07-26 ENCOUNTER — Other Ambulatory Visit: Payer: Self-pay | Admitting: Family Medicine

## 2015-08-09 ENCOUNTER — Encounter: Payer: Self-pay | Admitting: Internal Medicine

## 2015-08-09 ENCOUNTER — Other Ambulatory Visit: Payer: Self-pay | Admitting: Internal Medicine

## 2015-08-09 DIAGNOSIS — R739 Hyperglycemia, unspecified: Secondary | ICD-10-CM

## 2015-08-13 ENCOUNTER — Other Ambulatory Visit: Payer: Self-pay | Admitting: Internal Medicine

## 2015-08-13 ENCOUNTER — Other Ambulatory Visit (INDEPENDENT_AMBULATORY_CARE_PROVIDER_SITE_OTHER): Payer: Managed Care, Other (non HMO)

## 2015-08-13 DIAGNOSIS — E890 Postprocedural hypothyroidism: Secondary | ICD-10-CM

## 2015-08-13 DIAGNOSIS — E063 Autoimmune thyroiditis: Principal | ICD-10-CM

## 2015-08-13 DIAGNOSIS — E038 Other specified hypothyroidism: Secondary | ICD-10-CM

## 2015-08-13 DIAGNOSIS — R739 Hyperglycemia, unspecified: Secondary | ICD-10-CM | POA: Diagnosis not present

## 2015-08-13 LAB — TSH: TSH: 14.04 u[IU]/mL — AB (ref 0.35–4.50)

## 2015-08-13 LAB — T4, FREE: FREE T4: 0.72 ng/dL (ref 0.60–1.60)

## 2015-08-13 LAB — HEMOGLOBIN A1C: HEMOGLOBIN A1C: 5.8 % (ref 4.6–6.5)

## 2015-08-13 MED ORDER — SYNTHROID 137 MCG PO TABS: 137.0000 ug | ORAL_TABLET | Freq: Every day | ORAL | Status: DC

## 2015-08-29 ENCOUNTER — Other Ambulatory Visit: Payer: Self-pay | Admitting: Family Medicine

## 2015-09-29 ENCOUNTER — Other Ambulatory Visit: Payer: Self-pay | Admitting: Family Medicine

## 2015-10-02 ENCOUNTER — Other Ambulatory Visit: Payer: Self-pay | Admitting: Family Medicine

## 2015-10-03 ENCOUNTER — Other Ambulatory Visit: Payer: Self-pay | Admitting: Family Medicine

## 2015-10-08 ENCOUNTER — Other Ambulatory Visit (INDEPENDENT_AMBULATORY_CARE_PROVIDER_SITE_OTHER): Payer: Managed Care, Other (non HMO)

## 2015-10-08 DIAGNOSIS — E063 Autoimmune thyroiditis: Secondary | ICD-10-CM | POA: Diagnosis not present

## 2015-10-08 DIAGNOSIS — E038 Other specified hypothyroidism: Secondary | ICD-10-CM

## 2015-10-08 LAB — T4, FREE: Free T4: 0.84 ng/dL (ref 0.60–1.60)

## 2015-10-08 LAB — TSH: TSH: 8.12 u[IU]/mL — ABNORMAL HIGH (ref 0.35–4.50)

## 2015-10-09 ENCOUNTER — Other Ambulatory Visit: Payer: Self-pay | Admitting: Internal Medicine

## 2015-10-09 DIAGNOSIS — E063 Autoimmune thyroiditis: Principal | ICD-10-CM

## 2015-10-09 DIAGNOSIS — E890 Postprocedural hypothyroidism: Secondary | ICD-10-CM

## 2015-10-09 DIAGNOSIS — E038 Other specified hypothyroidism: Secondary | ICD-10-CM

## 2015-10-09 MED ORDER — SYNTHROID 150 MCG PO TABS: 150.0000 ug | ORAL_TABLET | Freq: Every day | ORAL | Status: DC

## 2015-11-01 ENCOUNTER — Other Ambulatory Visit: Payer: Self-pay | Admitting: Family Medicine

## 2015-11-01 ENCOUNTER — Telehealth: Payer: Self-pay | Admitting: Family Medicine

## 2015-11-01 NOTE — Telephone Encounter (Signed)
Have appointment for Monday with Dr Vicente Masson. Requesting enough of Zoloft to last until appointment.

## 2015-11-05 ENCOUNTER — Encounter: Payer: Self-pay | Admitting: Family Medicine

## 2015-11-05 ENCOUNTER — Ambulatory Visit (INDEPENDENT_AMBULATORY_CARE_PROVIDER_SITE_OTHER): Payer: Managed Care, Other (non HMO) | Admitting: Family Medicine

## 2015-11-05 VITALS — BP 118/74 | HR 113 | Temp 99.2°F | Resp 20 | Ht 62.0 in | Wt 350.1 lb

## 2015-11-05 DIAGNOSIS — H73001 Acute myringitis, right ear: Secondary | ICD-10-CM | POA: Diagnosis not present

## 2015-11-05 DIAGNOSIS — J309 Allergic rhinitis, unspecified: Secondary | ICD-10-CM | POA: Diagnosis not present

## 2015-11-05 DIAGNOSIS — K2100 Gastro-esophageal reflux disease with esophagitis, without bleeding: Secondary | ICD-10-CM

## 2015-11-05 DIAGNOSIS — O99345 Other mental disorders complicating the puerperium: Secondary | ICD-10-CM

## 2015-11-05 DIAGNOSIS — E559 Vitamin D deficiency, unspecified: Secondary | ICD-10-CM | POA: Diagnosis not present

## 2015-11-05 DIAGNOSIS — F53 Postpartum depression: Secondary | ICD-10-CM

## 2015-11-05 DIAGNOSIS — E890 Postprocedural hypothyroidism: Secondary | ICD-10-CM

## 2015-11-05 DIAGNOSIS — Z6841 Body Mass Index (BMI) 40.0 and over, adult: Secondary | ICD-10-CM | POA: Diagnosis not present

## 2015-11-05 MED ORDER — AZITHROMYCIN 250 MG PO TABS: ORAL_TABLET | ORAL | Status: DC

## 2015-11-05 MED ORDER — FLUCONAZOLE 150 MG PO TABS: 150.0000 mg | ORAL_TABLET | Freq: Once | ORAL | Status: DC

## 2015-11-05 MED ORDER — MONTELUKAST SODIUM 10 MG PO TABS: 10.0000 mg | ORAL_TABLET | Freq: Every day | ORAL | Status: DC

## 2015-11-05 MED ORDER — SERTRALINE HCL 100 MG PO TABS: 100.0000 mg | ORAL_TABLET | Freq: Every day | ORAL | Status: DC

## 2015-11-05 NOTE — Progress Notes (Signed)
Date:  11/05/2015   Name:  Linda Welch   DOB:  31-Dec-1980   MRN:  [id number]  PCP:  Ashok Norris, MD    Chief Complaint: Medication Refill   History of Present Illness:  This is a 35 y.o. female seen for refill of Zoloft and Singulair. Zoloft working reasonably well for depression/anxiety. S/p recent total thyroidectomy, Synthroid dose being managed by endo. Hx PCOS on metformin. No recent B12 level noted. Hx AR well controlled on Singulair and Zyrtec. On chronic PPI since November, has not tried to taper off. C/o R ear pain past several weeks, started with chest cold, still with NP cough and postnasal drip.  Review of Systems:  Review of Systems  Constitutional: Negative for fever.  Respiratory: Negative for shortness of breath.   Cardiovascular: Negative for chest pain and leg swelling.  Neurological: Negative for syncope and light-headedness.    Patient Active Problem List   Diagnosis Date Noted  . BMI 60.0-69.9, adult ([place]) 11/05/2015  . Allergic rhinitis 11/05/2015  . Postoperative hypothyroidism 06/25/2015  . Diabetes mellitus ([place]) 04/23/2015  . Diabetes ([place]) 11/06/2014  . Dysfunction of eustachian tube 11/06/2014  . Diabetes mellitus arising in pregnancy 11/06/2014  . H/O thyroidectomy 11/06/2014  . Hematuria, microscopic 11/06/2014  . Obstructive apnea 11/06/2014  . Papillary carcinoma of thyroid ([place]) 11/06/2014  . Leg pain 11/06/2014  . 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 H/O malignant neoplasm of thyroid 05/16/2014  . Hypothyroidism due to Hashimoto's thyroiditis 07/25/2013  . PCOS (polycystic ovarian syndrome) 07/25/2013  . Depression, postpartum 03/03/2013  . 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 Carrier of disease 08/21/2012  . Adult hypothyroidism 08/21/2012  . Migraine 02/18/2010  . Clinical depression 01/16/2010  . Reflux esophagitis 01/16/2010  . Dyssomnia 03/10/2008    Prior to Admission medications   Medication Sig Start  Date End Date Taking? Authorizing Provider  cetirizine (ZYRTEC) 10 MG tablet Take 10 mg by mouth daily.     Historical Provider, MD  metFORMIN (GLUCOPHAGE-XR) 750 MG 24 hr tablet TAKE 1 TABLET ORAL, TWO TIMES DAILY 07/27/15   Ashok Norris, MD  montelukast (SINGULAIR) 10 MG tablet Take 1 tablet (10 mg total) by mouth daily. 11/05/15   Adline Potter, MD  omeprazole (PRILOSEC) 20 MG capsule TAKE 1 CAPSULE (20 MG TOTAL) BY MOUTH DAILY. 08/29/15   Ashok Norris, MD  sertraline (ZOLOFT) 100 MG tablet Take 1 tablet (100 mg total) by mouth daily. 11/05/15   Adline Potter, MD  SYNTHROID 150 MCG tablet Take 1 tablet (150 mcg total) by mouth daily before breakfast. 10/09/15   Philemon Kingdom, MD    Allergies  Allergen Reactions  . Tioconazole Itching, Swelling and Other (See Comments)    Burning (severe)  . Tape Hives and Swelling    Adhesive tapes    Past Surgical History  Procedure Laterality Date  . Tonsilectomy, adenoidectomy, bilateral myringotomy and tubes      1984 and 1989  . Extracorporeal shock wave lithotripsy Left     left  . Thyroid lobectomy Right 05/16/2014    Procedure: RIGHT THYROID LOBECTOMY;  Surgeon: Armandina Gemma, MD;  Location: [facility];  Service: General;  Laterality: Right;  . Tonsillectomy    . Thyroidectomy N/A 06/01/2014    Procedure: COMPLETION THYROIDECTOMY;  Surgeon: Armandina Gemma, MD;  Location: [facility];  Service: General;  Laterality: N/A;    Social History  Substance Use Topics  . Smoking status: Never Smoker   . Smokeless tobacco: Never Used  .  Alcohol Use: No    Family History  Problem Relation Age of Onset  . Thyroid disease Mother     thyroid goiter  . Depression Mother   . Hyperlipidemia Mother   . Hypertension Mother   . Heart disease Mother     triple bypass  . Heart disease Father   . Hyperlipidemia Father   . Hypertension Father   . Sleep apnea Father   . Asthma Brother   . Depression Brother   . Heart disease Maternal Grandmother   .  Hyperlipidemia Maternal Grandmother   . Heart disease Maternal Grandfather   . Hyperlipidemia Maternal Grandfather   . Heart disease Paternal Grandmother   . Hyperlipidemia Paternal Grandmother   . Heart disease Paternal Grandfather   . Hyperlipidemia Paternal Grandfather   . Diabetes Paternal Grandfather   . Sleep apnea Paternal Grandfather     Medication list has been reviewed and updated.  Physical Examination: BP 118/74 mmHg  Pulse 113  Temp(Src) 99.2 F (37.3 C)  Resp 20  Ht 5\' 2"  (1.575 m)  Wt 350 lb 2 oz (158.816 kg)  BMI 64.02 kg/m2  SpO2 97%  LMP 09/15/2015 (Exact Date)  Physical Exam  Constitutional: She appears well-developed and well-nourished.  HENT:  Right Ear: External ear normal.  Left Ear: External ear normal.  R TM erythematous L TM clear  Eyes: Conjunctivae are normal.  Neck: Neck supple.  Cardiovascular: Normal rate, regular rhythm and normal heart sounds.   Pulmonary/Chest: Effort normal and breath sounds normal.  Musculoskeletal: She exhibits no edema.  Lymphadenopathy:    She has no cervical adenopathy.  Neurological: She is alert.  Skin: Skin is warm and dry.  Psychiatric: She has a normal mood and affect. Her behavior is normal.  Nursing note and vitals reviewed.   Assessment and Plan:  1. Myringitis, acute, right Zpak as directed, fluconazole x 1 dose to prevent vaginitis  2. Depression, postpartum Refill Zoloft - B12  3. Allergic rhinitis, unspecified allergic rhinitis type Refill Singulair  4. Reflux esophagitis On chronic PPI, discussed attempt to taper off, will try  5. Postoperative hypothyroidism On Synthroid, followed by endo  6. BMI 60.0-69.9, adult ([facility]) - Vitamin D (25 hydroxy)  Return in about 6 months (around 05/06/2016).  Satira Anis. [facility]  11/05/2015

## 2015-11-06 DIAGNOSIS — E559 Vitamin D deficiency, unspecified: Secondary | ICD-10-CM | POA: Insufficient documentation

## 2015-11-06 LAB — VITAMIN D 25 HYDROXY (VIT D DEFICIENCY, FRACTURES): VIT D 25 HYDROXY: 9.6 ng/mL — AB (ref 30.0–100.0)

## 2015-11-06 LAB — VITAMIN B12: VITAMIN B 12: 332 pg/mL (ref 211–946)

## 2015-11-06 MED ORDER — VITAMIN D 50 MCG (2000 UT) PO CAPS: 1.0000 | ORAL_CAPSULE | Freq: Every day | ORAL | Status: DC

## 2015-11-06 NOTE — Addendum Note (Signed)
Addended by: Adline Potter on: 11/06/2015 04:08 PM   Modules accepted: Orders

## 2015-12-24 ENCOUNTER — Ambulatory Visit (INDEPENDENT_AMBULATORY_CARE_PROVIDER_SITE_OTHER): Payer: Managed Care, Other (non HMO) | Admitting: Internal Medicine

## 2015-12-24 VITALS — BP 140/92 | HR 81 | Ht 63.0 in | Wt 351.0 lb

## 2015-12-24 DIAGNOSIS — E063 Autoimmune thyroiditis: Secondary | ICD-10-CM

## 2015-12-24 DIAGNOSIS — E038 Other specified hypothyroidism: Secondary | ICD-10-CM

## 2015-12-24 DIAGNOSIS — C73 Malignant neoplasm of thyroid gland: Secondary | ICD-10-CM

## 2015-12-24 DIAGNOSIS — E890 Postprocedural hypothyroidism: Secondary | ICD-10-CM | POA: Diagnosis not present

## 2015-12-24 LAB — TSH: TSH: 7.61 u[IU]/mL — ABNORMAL HIGH (ref 0.35–4.50)

## 2015-12-24 LAB — T4, FREE: Free T4: 0.94 ng/dL (ref 0.60–1.60)

## 2015-12-24 NOTE — Progress Notes (Signed)
Patient ID: Linda Welch, female   DOB: 1980-10-14, 35 y.o.   MRN: [id number]  HPI  Linda Welch is a 35 y.o.-year-old female, returning for Hashimoto's hypothyroidism and thyroid nodules. She is here with her husband and her son. Last visit 6 mo ago.   She was dx'ed with vit D deficiency since last visit: 9.6 >> started vit D 5000 IU daily.  She started to have neck discomfort (not with swallowing)  Follicular variant of papillary thyroid cancer: - dx in in 02/2009 >> 3 cm nodule felt by pt >> seen by PCP >> referred to endo >> uptake and scan >> cold nodule >> FNA by Dr Francoise Schaumann: benign (bad experience).  - thyroid U/S 2010: 3.1 x 2.6 x 2.2 cm complex, slightly hypoechoic, nodule, situated in the mid pole of the right thyroid lobe - Thyroid ultrasound 02/06/2014: Large nodule occupying most of the right lobe measures 5.2 x 3.2 x 4.1 cm. There are some internal calcifications. It previously measured 3.1 x 2.6 x 2.2 cm based on the prior report. - FNA 83/41/9622: FOLLICULAR NEOPLASM AND/OR LESION ([facility])  - Right lobectomy 05/16/2014 and completion thyroidectomy 06/01/2014 Thyroid, lobectomy, right - FOLLICULAR VARIANT OF PAPILLARY THYROID CARCINOMA WITH CAPSULAR INVASION, 4.2 CM, CONFINED WITHIN THYROIDAL TISSUE. - NO EVIDENCE OF ANGIOLYMPHATIC INVASION IDENTIFIED. - RESECTION MARGINS, NEGATIVE FOR ATYPIA OR MALIGNANCY. Microscopic Comment THYROID Specimen: Right thyroid Procedure: Right hemithyroidectomy Specimen Integrity (intact/fragmented): Intact Tumor focality: Unifocal Maximum tumor size (cm): 4.2 cm, gross description Tumor laterality: Right thyroid gland Histologic type (including subtype and/or unique features as applicable): Follicular variant of papillary thyroid carcinoma Tumor capsule: Present Extrathyroidal extension: No Margins: Negative Lymph - Vascular invasion: Not identified Capsular invasion with degree of invasion if present: Present, complete Lymph  nodes: # examined N/A; # positive; N/A TNM code: pT3 , pNX Non-neoplastic thyroid: Unremarkable.  Hypothyroidism: Pt. has been dx with hypothyroidism in 2010; is on Synthroid 88 >> 100 >> 150 (since 09/2015) mcg, taken: - fasting - with water or black coffee - separated by >30 min from b'fast  - + calcium with dinner - no iron, multivitamins - + PPIs -  at bedtime  I reviewed pt's thyroid tests: Lab Results  Component Value Date   TSH 8.12 (H) 10/08/2015   TSH 14.04 (H) 08/13/2015   TSH 20.51 (H) 06/25/2015   TSH 26.78 (H) 12/28/2014   TSH 0.18 (L) 01/23/2014   FREET4 0.84 10/08/2015   FREET4 0.72 08/13/2015   FREET4 0.68 06/25/2015   FREET4 0.66 12/28/2014   FREET4 0.92 01/23/2014  03/29/2013: TSH 0.93, fT4 1.03 02/04/2013 (towards end of pregnancy): TSH 0.12, fT4 0.96 >> Synthroid reduced from 100 to 88 mcg 12/2012: TSH 0.07 >> Synthroid reduced from 112 to 100 mcg  Pt describes: - no cold intolerance, but also hot flushes (mainly face) >> not new  - + weight gain - + fatigue - ongoing for a long time - no N/V/diarrhea  - no dry skin - + hair loss - no palpitations - + mm aches and joint pain  - long menstrual cycles  Pt is not feeling nodules in neck, no hoarseness, no dysphagia/odynophagia, no SOB with lying down. She has slight neck discomfort - cannotdescribe it.   ROS: Constitutional: see HPI Eyes: no blurry vision, no xerophthalmia ENT: no sore throat, see HPI Cardiovascular: no CP/SOB/palpitations/no leg swelling Respiratory: no cough/SOB Gastrointestinal: no N/V/D/C Musculoskeletal: + both: muscle/joint aches Skin: no rashes, + hair loss Neurological: no tremors/numbness/tingling/dizziness,  no HAs  I reviewed pt's medications, allergies, PMH, social hx, family hx, and changes were documented in the history of present illness. Otherwise, unchanged from my initial visit note. Started vit D and Calcium.  PE: BP (!) 140/92 (BP Location: Right Arm,  Patient Position: Sitting)   Pulse 81   Ht '5\' 3"'  (1.6 m)   Wt (!) 351 lb (159.2 kg)   LMP 12/02/2015   SpO2 97%   BMI 62.18 kg/m  Wt Readings from Last 3 Encounters:  12/24/15 (!) 351 lb (159.2 kg)  11/05/15 (!) 350 lb 2 oz (158.8 kg)  06/25/15 (!) 344 lb 9.6 oz (156.3 kg)   Constitutional: obese, in NAD Eyes: PERRLA, EOMI, no exophthalmos ENT: moist mucous membranes, + thyroid scar healed, no neck masses palpated, no cervical lymphadenopathy Cardiovascular: RRR, No MRG Respiratory: CTA B Gastrointestinal: abdomen soft, NT, ND, BS+ Musculoskeletal: no deformities, strength intact in all 4 Skin: moist, warm, no rashes Neurological: no tremor with outstretched hands, DTR normal in all 4  ASSESSMENT: 1. Follicular variant of PTC  2. Hashimoto's Hypothyroidism, now postsurgical hypothyroidism  PLAN:  1. PTC - she is healing well her total thyroidectomy- no swelling, pain or erythema, dysesthesia - we again discussed about her FV papillary thyroid cancer.  reviewed the pathology  underlined unifocality   Underlined that it was encapsulated  did not spread to the lymph vessels  discussed prognosis >> excellent - at last visits, we discussed about the encapsulated Follicular variant of PTC being qualified as a benign tumor  - she decided to forego the RAI tx for now, since she is stage 1 TNM cancer (low grade) - no masses felt at neck palpation - she does have some neck discomfort - will check a thyroid U/S - will also check Tg + ATA - I will see her in 6 mo  2. Hypothyroidism Patient with now post-op hypothyroidism, on levothyroxine (LT4) therapy. She appears euthyroid, but has increased chronic fatigue and her TSH levels were high (improving, though) >> increased LT4 dose  to 150 mcg daily in 09/2015 >> continues this - We discussed about correct intake of levothyroxine, fasting, with water, separated by at least 30 minutes from breakfast, and separated by more than 4  hours from calcium, iron, multivitamins, acid reflux medications (PPIs). She does take it correctly. - we'll check thyroid tests today: TSH, free T4 - If these are abnormal, she will need to return in 5-6 weeks for repeat labs  - If these are normal, I will see her back in 6 months  Needs refills.  Office Visit on 12/24/2015  Component Date Value Ref Range Status  . Free T4 12/24/2015 0.94  0.60 - 1.60 ng/dL Final  . Thyroglobulin 12/25/2015 0.6* 2.8 - 40.9 ng/mL Final   Comment: Thyroglobulin antibodies (TGAb) interfere with Thyroglobulin (TG) assays; therefore, Thyroglobulin antibody (TGAb) assay should always be performed in conjunction with a Thyroglobulin (TG) assay.   This test was performed using the Beckman Coulter chemiluminescent method.  Values obtained from different assay methods cannot be used interchangeably.  Thyroglobulin levels, regardless of value, should not be interpreted as absolute evidence of the presence or absence of disease.   . Thyroglobulin Ab 12/25/2015 <1  <2 IU/mL Final  . TSH 12/24/2015 7.61* 0.35 - 4.50 uIU/mL Final   Thyroglobulin level is low. I will continue to follow this. TSH is still high >> will increase the levothyroxine dose to 175 mcg  daily.  Will need to recheck  her TFTs in 6 weeks. Thyroid ultrasound pending. I will addend the results when they become available.  Philemon Kingdom, MD PhD [facility] Endocrinology

## 2015-12-24 NOTE — Patient Instructions (Signed)
Please stop at the lab.  Please continue Levothyroxine 150 mcg daily.  Take the thyroid hormone every day, with water, at least 30 minutes before breakfast, separated by at least 4 hours from: - acid reflux medications - calcium - iron - multivitamins  We will schedule the neck U/S and call you with the schedule.  Please come back for a follow-up appointment in 6 months.

## 2015-12-25 ENCOUNTER — Encounter: Payer: Self-pay | Admitting: Internal Medicine

## 2015-12-25 LAB — THYROGLOBULIN LEVEL: THYROGLOBULIN: 0.6 ng/mL — AB (ref 2.8–40.9)

## 2015-12-25 LAB — THYROGLOBULIN ANTIBODY

## 2015-12-25 MED ORDER — SYNTHROID 175 MCG PO TABS: 175.0000 ug | ORAL_TABLET | Freq: Every day | ORAL | 1 refills | Status: DC

## 2016-01-05 ENCOUNTER — Ambulatory Visit
Admission: RE | Admit: 2016-01-05 | Discharge: 2016-01-05 | Disposition: A | Discharge status: Home / Self Care | Payer: Managed Care, Other (non HMO) | Source: Ambulatory Visit | Attending: Pulmonary Disease | Admitting: Pulmonary Disease

## 2016-01-05 ENCOUNTER — Other Ambulatory Visit: Payer: Self-pay | Admitting: Pulmonary Disease

## 2016-01-05 DIAGNOSIS — S92034A Nondisplaced avulsion fracture of tuberosity of right calcaneus, initial encounter for closed fracture: Secondary | ICD-10-CM | POA: Diagnosis not present

## 2016-01-05 DIAGNOSIS — X58XXXA Exposure to other specified factors, initial encounter: Secondary | ICD-10-CM | POA: Diagnosis not present

## 2016-01-05 DIAGNOSIS — S8991XA Unspecified injury of right lower leg, initial encounter: Secondary | ICD-10-CM

## 2016-01-05 DIAGNOSIS — M7660 Achilles tendinitis, unspecified leg: Secondary | ICD-10-CM | POA: Insufficient documentation

## 2016-01-07 ENCOUNTER — Ambulatory Visit
Admission: RE | Admit: 2016-01-07 | Discharge: 2016-01-07 | Disposition: A | Discharge status: Home / Self Care | Payer: Managed Care, Other (non HMO) | Source: Ambulatory Visit | Attending: Internal Medicine | Admitting: Internal Medicine

## 2016-01-07 DIAGNOSIS — C73 Malignant neoplasm of thyroid gland: Secondary | ICD-10-CM

## 2016-01-10 ENCOUNTER — Other Ambulatory Visit: Payer: Self-pay

## 2016-01-10 MED ORDER — OMEPRAZOLE 20 MG PO CPDR: 20.0000 mg | DELAYED_RELEASE_CAPSULE | ORAL | 0 refills | Status: DC

## 2016-01-10 NOTE — Telephone Encounter (Signed)
Last note reviewed; Dr. Vicente Masson recommended taper; will start taper

## 2016-01-18 ENCOUNTER — Other Ambulatory Visit: Payer: Self-pay | Admitting: Orthopedic Surgery

## 2016-01-23 NOTE — Pre-Procedure Instructions (Addendum)
Linda Welch  01/23/2016     Your procedure is scheduled on : Tuesday January 29, 2016 at 2:00 PM.  Report to [facility] Admitting at 12:00 PM.   Call this number if you have problems the morning of surgery: [phone]    Remember:  Do not eat food or drink liquids after midnight.   Take these medicines the morning of surgery with A SIP OF WATER : Synthroid   Stop taking any vitamins, herbal medications/supplements, NSAIDs, Ibuprofen, Advil, Motrin, Aleve, etc today   Do NOT take Metformin the morning of surgery.   Do not wear jewelry, make-up or nail polish.  Do not wear lotions, powders, or perfumes, or deoderant.  Do not shave 48 hours prior to surgery.    Do not bring valuables to the hospital.  [facility] is not responsible for any belongings or valuables.  Contacts, dentures or bridgework may not be worn into surgery.  Leave your suitcase in the car.  After surgery it may be brought to your room.  For patients admitted to the hospital, discharge time will be determined by your treatment team.  Patients discharged the day of surgery will not be allowed to drive home.   Name and phone number of your driver:    Special instructions:  Shower using CHG soap the night before and the morning of your surgery  Please read over the following fact sheets that you were given.

## 2016-01-24 ENCOUNTER — Encounter (HOSPITAL_COMMUNITY)
Admission: RE | Admit: 2016-01-24 | Discharge: 2016-01-24 | Disposition: A | Discharge status: Home / Self Care | Payer: Worker's Compensation | Source: Ambulatory Visit | Attending: Orthopedic Surgery | Admitting: Orthopedic Surgery

## 2016-01-24 ENCOUNTER — Encounter (HOSPITAL_COMMUNITY): Payer: Self-pay

## 2016-01-24 DIAGNOSIS — Z7984 Long term (current) use of oral hypoglycemic drugs: Secondary | ICD-10-CM | POA: Insufficient documentation

## 2016-01-24 DIAGNOSIS — G4733 Obstructive sleep apnea (adult) (pediatric): Secondary | ICD-10-CM | POA: Insufficient documentation

## 2016-01-24 DIAGNOSIS — F419 Anxiety disorder, unspecified: Secondary | ICD-10-CM | POA: Insufficient documentation

## 2016-01-24 DIAGNOSIS — Z79899 Other long term (current) drug therapy: Secondary | ICD-10-CM | POA: Diagnosis not present

## 2016-01-24 DIAGNOSIS — F329 Major depressive disorder, single episode, unspecified: Secondary | ICD-10-CM | POA: Insufficient documentation

## 2016-01-24 DIAGNOSIS — Z01812 Encounter for preprocedural laboratory examination: Secondary | ICD-10-CM | POA: Diagnosis not present

## 2016-01-24 DIAGNOSIS — Z85850 Personal history of malignant neoplasm of thyroid: Secondary | ICD-10-CM | POA: Diagnosis not present

## 2016-01-24 DIAGNOSIS — E282 Polycystic ovarian syndrome: Secondary | ICD-10-CM | POA: Diagnosis not present

## 2016-01-24 DIAGNOSIS — Z87891 Personal history of nicotine dependence: Secondary | ICD-10-CM | POA: Insufficient documentation

## 2016-01-24 DIAGNOSIS — K219 Gastro-esophageal reflux disease without esophagitis: Secondary | ICD-10-CM | POA: Insufficient documentation

## 2016-01-24 DIAGNOSIS — Z01818 Encounter for other preprocedural examination: Secondary | ICD-10-CM | POA: Insufficient documentation

## 2016-01-24 HISTORY — DX: Failed or difficult intubation, initial encounter: T88.4XXA

## 2016-01-24 HISTORY — DX: Other complications of anesthesia, initial encounter: T88.59XA

## 2016-01-24 HISTORY — DX: Personal history of other diseases of the respiratory system: Z87.09

## 2016-01-24 HISTORY — DX: Adverse effect of unspecified anesthetic, initial encounter: T41.45XA

## 2016-01-24 HISTORY — DX: Stress incontinence (female) (male): N39.3

## 2016-01-24 HISTORY — DX: Gastro-esophageal reflux disease without esophagitis: K21.9

## 2016-01-24 HISTORY — DX: Presence of spectacles and contact lenses: Z97.3

## 2016-01-24 HISTORY — DX: Malignant (primary) neoplasm, unspecified: C80.1

## 2016-01-24 LAB — CBC
HEMATOCRIT: 38.8 % (ref 36.0–46.0)
Hemoglobin: 12.4 g/dL (ref 12.0–15.0)
MCH: 27.6 pg (ref 26.0–34.0)
MCHC: 32 g/dL (ref 30.0–36.0)
MCV: 86.2 fL (ref 78.0–100.0)
PLATELETS: 246 10*3/uL (ref 150–400)
RBC: 4.5 MIL/uL (ref 3.87–5.11)
RDW: 15.2 % (ref 11.5–15.5)
WBC: 7.4 10*3/uL (ref 4.0–10.5)

## 2016-01-24 LAB — HCG, SERUM, QUALITATIVE: Preg, Serum: NEGATIVE

## 2016-01-24 LAB — BASIC METABOLIC PANEL
Anion gap: 7 (ref 5–15)
BUN: 10 mg/dL (ref 6–20)
CO2: 26 mmol/L (ref 22–32)
Calcium: 8.6 mg/dL — ABNORMAL LOW (ref 8.9–10.3)
Chloride: 104 mmol/L (ref 101–111)
Creatinine, Ser: 0.66 mg/dL (ref 0.44–1.00)
GFR calc Af Amer: >60 mL/min (ref >60)
GLUCOSE: 95 mg/dL (ref 65–99)
POTASSIUM: 4 mmol/L (ref 3.5–5.1)
Sodium: 137 mmol/L (ref 135–145)

## 2016-01-24 NOTE — Progress Notes (Signed)
PCP - Dr. Ashok Norris Cardiologist - denies  EKG - denies CXR - denies  Echo/stress test/Cardiac Cath - denies  Patient denies chest pain and shortness of breath at PAT appointment.  Patient states that she does not have diabetes and that she takes Metformin for PCOS.    Linda Gianotti, PA notified that patient is a difficult intubation.

## 2016-01-25 MED ORDER — DEXTROSE 5 % IV SOLN
3.0000 g | INTRAVENOUS | Status: AC
  Administered 2016-01-29: 3 g via INTRAVENOUS
  Filled 2016-01-25: qty 3000

## 2016-01-25 NOTE — Progress Notes (Addendum)
Anesthesia chart review: Patient is a 35 year old female scheduled for right Achilles tendon reconstruction, gastroc recession, possible flexor hallux longus transfer on 01/29/16 by Dr. Doran Durand.  History includes PCOS (on metformin), former smoker (quit 2000), depression, anxiety, Hashimoto's thyroiditis, thyroid cancer s/p right thyroidectomy 05/16/14 with completion thyroidectomy 06/01/14, OSA (with CPAP), GERD, T&A, migraines. For anesthesia history she reports difficult intubation and postoperative myalgias. BMI is consistent with morbid obesity (BMI 62.94).  2015-2016 [place] Anesthesia Records  06/01/2014: Intubation Type: IV induction Ventilation: Mask ventilation without difficulty Tube size: 7.0 mm Number of attempts: 1 Airway Equipment and Method: Patient positioned with wedge pillow, Video-laryngoscopy and Stylet Placement Confirmation: ETT inserted through vocal cords under direct vision,  positive ETCO2 and breath sounds checked- equal and bilateral Secured at: 21 cm Tube secured with: Tape Dental Injury: Teeth and Oropharynx as per pre-operative assessment  Difficulty Due To: Difficulty was anticipated, Difficult Airway- due to reduced neck mobility and Difficult Airway- due to limited oral opening Future Recommendations: Recommend- induction with short-acting agent, and alternative techniques readily available Comments: Elective Glidescope intubation with #3 due to prior anesthesia record. ETT 7.0 passed with ease. Easy to mask ventilate without need for OA. Recommend use of Glidescope #3 in future.  05/16/14: Difficult airway anticipated due to small mouth opening/limited oral opening with redundant neck tissue. DL x 1 by SRNA with Mac 3 revealed only epiglottis. Glidescope #3 used x1 to reveal grade 1 view- trachea intubated with 7.5 ETT. Nontraumatic intubation.   PCP is Dr. Ashok Norris. Endocrinologist is Dr. Cruzita Lederer.  Meds include Zyrtec, metformin, Singulair, Prilosec,  Zoloft, Synthroid.  BP 132/77   Pulse 88   Temp 36.4 C   Resp 20   Ht 5\' 3"  (1.6 m)   Wt (!) 355 lb 4.8 oz (161.2 kg)   LMP 01/07/2016 (Approximate)   SpO2 98%   BMI 62.94 kg/m   Preoperative labs noted. Cr 0.66, CBC WNL. glucose 95. Serum pregnancy test is negative.  If no acute changes then I anticipate that she can proceed as planned. Definitive anesthesia plan following evaluation on the day of surgery.  George Hugh [facility] Short Stay Center/Anesthesiology Phone [phone] 01/25/2016 9:50 AM

## 2016-01-29 ENCOUNTER — Encounter (HOSPITAL_COMMUNITY): Payer: Self-pay | Admitting: Urology

## 2016-01-29 ENCOUNTER — Encounter (HOSPITAL_COMMUNITY)
Admission: RE | Disposition: A | Discharge status: Home / Self Care | Payer: Self-pay | Source: Ambulatory Visit | Attending: Orthopedic Surgery

## 2016-01-29 ENCOUNTER — Ambulatory Visit (HOSPITAL_COMMUNITY): Payer: Worker's Compensation | Admitting: Anesthesiology

## 2016-01-29 ENCOUNTER — Ambulatory Visit (HOSPITAL_COMMUNITY): Payer: Worker's Compensation | Admitting: Vascular Surgery

## 2016-01-29 ENCOUNTER — Ambulatory Visit (HOSPITAL_COMMUNITY)
Admission: RE | Admit: 2016-01-29 | Discharge: 2016-01-29 | Disposition: A | Discharge status: Home / Self Care | Payer: Worker's Compensation | Source: Ambulatory Visit | Attending: Orthopedic Surgery | Admitting: Orthopedic Surgery

## 2016-01-29 DIAGNOSIS — G4733 Obstructive sleep apnea (adult) (pediatric): Secondary | ICD-10-CM | POA: Insufficient documentation

## 2016-01-29 DIAGNOSIS — S86011A Strain of right Achilles tendon, initial encounter: Secondary | ICD-10-CM | POA: Diagnosis not present

## 2016-01-29 DIAGNOSIS — K219 Gastro-esophageal reflux disease without esophagitis: Secondary | ICD-10-CM | POA: Diagnosis not present

## 2016-01-29 DIAGNOSIS — S86091A Other specified injury of right Achilles tendon, initial encounter: Secondary | ICD-10-CM | POA: Diagnosis present

## 2016-01-29 DIAGNOSIS — E063 Autoimmune thyroiditis: Secondary | ICD-10-CM | POA: Insufficient documentation

## 2016-01-29 DIAGNOSIS — M7661 Achilles tendinitis, right leg: Secondary | ICD-10-CM | POA: Insufficient documentation

## 2016-01-29 DIAGNOSIS — Z87891 Personal history of nicotine dependence: Secondary | ICD-10-CM | POA: Insufficient documentation

## 2016-01-29 DIAGNOSIS — X58XXXA Exposure to other specified factors, initial encounter: Secondary | ICD-10-CM | POA: Diagnosis not present

## 2016-01-29 DIAGNOSIS — Z79899 Other long term (current) drug therapy: Secondary | ICD-10-CM | POA: Diagnosis not present

## 2016-01-29 DIAGNOSIS — Z6841 Body Mass Index (BMI) 40.0 and over, adult: Secondary | ICD-10-CM | POA: Insufficient documentation

## 2016-01-29 DIAGNOSIS — Z85850 Personal history of malignant neoplasm of thyroid: Secondary | ICD-10-CM | POA: Diagnosis not present

## 2016-01-29 DIAGNOSIS — M7731 Calcaneal spur, right foot: Secondary | ICD-10-CM | POA: Insufficient documentation

## 2016-01-29 DIAGNOSIS — F329 Major depressive disorder, single episode, unspecified: Secondary | ICD-10-CM | POA: Diagnosis not present

## 2016-01-29 HISTORY — PX: ACHILLES TENDON SURGERY: SHX542

## 2016-01-29 HISTORY — PX: GASTROCNEMIUS RECESSION: SHX863

## 2016-01-29 LAB — GLUCOSE, CAPILLARY: GLUCOSE-CAPILLARY: 85 mg/dL (ref 65–99)

## 2016-01-29 SURGERY — REPAIR, TENDON, ACHILLES
Anesthesia: Regional

## 2016-01-29 MED ORDER — FENTANYL CITRATE (PF) 100 MCG/2ML IJ SOLN
INTRAMUSCULAR | Status: DC | PRN
  Administered 2016-01-29: 25 ug via INTRAVENOUS
  Administered 2016-01-29: 50 ug via INTRAVENOUS
  Administered 2016-01-29: 25 ug via INTRAVENOUS
  Administered 2016-01-29: 100 ug via INTRAVENOUS

## 2016-01-29 MED ORDER — PROPOFOL 10 MG/ML IV BOLUS
INTRAVENOUS | Status: DC | PRN
  Administered 2016-01-29: 200 mg via INTRAVENOUS

## 2016-01-29 MED ORDER — BUPIVACAINE-EPINEPHRINE (PF) 0.5% -1:200000 IJ SOLN
INTRAMUSCULAR | Status: DC | PRN
  Administered 2016-01-29: 30 mL via PERINEURAL

## 2016-01-29 MED ORDER — OXYCODONE HCL 5 MG PO TABS: 5.0000 mg | ORAL_TABLET | ORAL | 0 refills | Status: DC | PRN

## 2016-01-29 MED ORDER — OXYCODONE HCL 5 MG PO TABS
ORAL_TABLET | ORAL | Status: AC
  Filled 2016-01-29: qty 2

## 2016-01-29 MED ORDER — LIDOCAINE 2% (20 MG/ML) 5 ML SYRINGE
INTRAMUSCULAR | Status: DC | PRN
  Administered 2016-01-29: 100 mg via INTRAVENOUS

## 2016-01-29 MED ORDER — SUGAMMADEX SODIUM 500 MG/5ML IV SOLN
INTRAVENOUS | Status: AC
  Filled 2016-01-29: qty 5

## 2016-01-29 MED ORDER — SUGAMMADEX SODIUM 200 MG/2ML IV SOLN
INTRAVENOUS | Status: DC | PRN
  Administered 2016-01-29: 325 mg via INTRAVENOUS

## 2016-01-29 MED ORDER — MIDAZOLAM HCL 2 MG/2ML IJ SOLN
INTRAMUSCULAR | Status: AC
  Administered 2016-01-29: 1 mg via INTRAVENOUS
  Filled 2016-01-29: qty 2

## 2016-01-29 MED ORDER — LACTATED RINGERS IV SOLN
INTRAVENOUS | Status: DC
  Administered 2016-01-29 (×2): via INTRAVENOUS

## 2016-01-29 MED ORDER — ROCURONIUM BROMIDE 10 MG/ML (PF) SYRINGE
PREFILLED_SYRINGE | INTRAVENOUS | Status: DC | PRN
  Administered 2016-01-29: 10 mg via INTRAVENOUS
  Administered 2016-01-29: 40 mg via INTRAVENOUS

## 2016-01-29 MED ORDER — PHENYLEPHRINE 40 MCG/ML (10ML) SYRINGE FOR IV PUSH (FOR BLOOD PRESSURE SUPPORT)
PREFILLED_SYRINGE | INTRAVENOUS | Status: DC | PRN
  Administered 2016-01-29: 40 ug via INTRAVENOUS

## 2016-01-29 MED ORDER — MIDAZOLAM HCL 5 MG/5ML IJ SOLN
INTRAMUSCULAR | Status: DC | PRN
  Administered 2016-01-29: 2 mg via INTRAVENOUS

## 2016-01-29 MED ORDER — FENTANYL CITRATE (PF) 100 MCG/2ML IJ SOLN
50.0000 ug | Freq: Once | INTRAMUSCULAR | Status: AC
  Administered 2016-01-29: 50 ug via INTRAVENOUS

## 2016-01-29 MED ORDER — SUCCINYLCHOLINE CHLORIDE 20 MG/ML IJ SOLN
INTRAMUSCULAR | Status: DC | PRN
  Administered 2016-01-29: 140 mg via INTRAVENOUS

## 2016-01-29 MED ORDER — SENNA 8.6 MG PO TABS: 2.0000 | ORAL_TABLET | Freq: Two times a day (BID) | ORAL | 0 refills | Status: DC

## 2016-01-29 MED ORDER — DOCUSATE SODIUM 100 MG PO CAPS: 100.0000 mg | ORAL_CAPSULE | Freq: Two times a day (BID) | ORAL | 0 refills | Status: DC

## 2016-01-29 MED ORDER — CHLORHEXIDINE GLUCONATE 4 % EX LIQD: 60.0000 mL | Freq: Once | CUTANEOUS | Status: DC

## 2016-01-29 MED ORDER — FENTANYL CITRATE (PF) 100 MCG/2ML IJ SOLN
INTRAMUSCULAR | Status: AC
  Filled 2016-01-29: qty 4

## 2016-01-29 MED ORDER — ONDANSETRON HCL 4 MG/2ML IJ SOLN
INTRAMUSCULAR | Status: DC | PRN
  Administered 2016-01-29: 4 mg via INTRAVENOUS

## 2016-01-29 MED ORDER — MIDAZOLAM HCL 2 MG/2ML IJ SOLN
INTRAMUSCULAR | Status: AC
  Filled 2016-01-29: qty 2

## 2016-01-29 MED ORDER — PROPOFOL 10 MG/ML IV BOLUS
INTRAVENOUS | Status: AC
  Filled 2016-01-29: qty 20

## 2016-01-29 MED ORDER — PROMETHAZINE HCL 25 MG/ML IJ SOLN
6.2500 mg | INTRAMUSCULAR | Status: DC | PRN
  Administered 2016-01-29: 6.25 mg via INTRAVENOUS

## 2016-01-29 MED ORDER — PROMETHAZINE HCL 25 MG/ML IJ SOLN
INTRAMUSCULAR | Status: AC
  Administered 2016-01-29: 6.25 mg via INTRAVENOUS
  Filled 2016-01-29: qty 1

## 2016-01-29 MED ORDER — OXYCODONE HCL 5 MG PO TABS
5.0000 mg | ORAL_TABLET | Freq: Once | ORAL | Status: AC
  Administered 2016-01-29: 10 mg via ORAL

## 2016-01-29 MED ORDER — PHENYLEPHRINE 40 MCG/ML (10ML) SYRINGE FOR IV PUSH (FOR BLOOD PRESSURE SUPPORT)
PREFILLED_SYRINGE | INTRAVENOUS | Status: AC
  Filled 2016-01-29: qty 10

## 2016-01-29 MED ORDER — FENTANYL CITRATE (PF) 100 MCG/2ML IJ SOLN: 25.0000 ug | INTRAMUSCULAR | Status: DC | PRN

## 2016-01-29 MED ORDER — SODIUM CHLORIDE 0.9 % IV SOLN: INTRAVENOUS | Status: DC

## 2016-01-29 MED ORDER — ASPIRIN EC 81 MG PO TBEC: 81.0000 mg | DELAYED_RELEASE_TABLET | Freq: Two times a day (BID) | ORAL | 0 refills | Status: DC

## 2016-01-29 MED ORDER — MIDAZOLAM HCL 2 MG/2ML IJ SOLN
1.0000 mg | Freq: Once | INTRAMUSCULAR | Status: AC
  Administered 2016-01-29: 1 mg via INTRAVENOUS

## 2016-01-29 MED ORDER — 0.9 % SODIUM CHLORIDE (POUR BTL) OPTIME
TOPICAL | Status: DC | PRN
  Administered 2016-01-29: 1000 mL

## 2016-01-29 MED ORDER — FENTANYL CITRATE (PF) 100 MCG/2ML IJ SOLN
INTRAMUSCULAR | Status: AC
  Administered 2016-01-29: 50 ug via INTRAVENOUS
  Filled 2016-01-29: qty 2

## 2016-01-29 SURGICAL SUPPLY — 67 items
ARTHREX BEATH PIN ×3 IMPLANT
BANDAGE ACE 4X5 VEL STRL LF (GAUZE/BANDAGES/DRESSINGS) ×3 IMPLANT
BANDAGE ACE 6X5 VEL STRL LF (GAUZE/BANDAGES/DRESSINGS) ×3 IMPLANT
BANDAGE ESMARK 6X9 LF (GAUZE/BANDAGES/DRESSINGS) ×1 IMPLANT
BLADE MICRO SAGITTAL (BLADE) ×3 IMPLANT
BLADE SURG 10 STRL SS (BLADE) ×3 IMPLANT
BNDG COHESIVE 4X5 TAN STRL (GAUZE/BANDAGES/DRESSINGS) ×3 IMPLANT
BNDG COHESIVE 6X5 TAN STRL LF (GAUZE/BANDAGES/DRESSINGS) ×3 IMPLANT
BNDG ESMARK 6X9 LF (GAUZE/BANDAGES/DRESSINGS) ×3
COVER SURGICAL LIGHT HANDLE (MISCELLANEOUS) ×3 IMPLANT
CUFF TOURNIQUET SINGLE 44IN (TOURNIQUET CUFF) IMPLANT
DRAPE U-SHAPE 47X51 STRL (DRAPES) ×3 IMPLANT
DRSG ADAPTIC 3X8 NADH LF (GAUZE/BANDAGES/DRESSINGS) IMPLANT
DRSG MEPITEL 4X7.2 (GAUZE/BANDAGES/DRESSINGS) ×3 IMPLANT
DRSG PAD ABDOMINAL 8X10 ST (GAUZE/BANDAGES/DRESSINGS) IMPLANT
ELECT REM PT RETURN 9FT ADLT (ELECTROSURGICAL) ×3
ELECTRODE REM PT RTRN 9FT ADLT (ELECTROSURGICAL) ×1 IMPLANT
GAUZE SPONGE 4X4 12PLY STRL (GAUZE/BANDAGES/DRESSINGS) IMPLANT
GLOVE BIO SURGEON STRL SZ7 (GLOVE) ×6 IMPLANT
GLOVE BIO SURGEON STRL SZ8 (GLOVE) ×9 IMPLANT
GLOVE BIOGEL PI IND STRL 7.0 (GLOVE) ×1 IMPLANT
GLOVE BIOGEL PI IND STRL 7.5 (GLOVE) ×1 IMPLANT
GLOVE BIOGEL PI IND STRL 8 (GLOVE) ×2 IMPLANT
GLOVE BIOGEL PI INDICATOR 7.0 (GLOVE) ×2
GLOVE BIOGEL PI INDICATOR 7.5 (GLOVE) ×2
GLOVE BIOGEL PI INDICATOR 8 (GLOVE) ×4
GLOVE ECLIPSE 7.5 STRL STRAW (GLOVE) ×3 IMPLANT
GOWN STRL REUS W/ TWL LRG LVL3 (GOWN DISPOSABLE) ×2 IMPLANT
GOWN STRL REUS W/ TWL XL LVL3 (GOWN DISPOSABLE) ×2 IMPLANT
GOWN STRL REUS W/TWL LRG LVL3 (GOWN DISPOSABLE) ×4
GOWN STRL REUS W/TWL XL LVL3 (GOWN DISPOSABLE) ×4
K-WIRE 1.1 (WIRE) ×2
K-WIRE FX150X1.1XTROC TIP (WIRE) ×1
KIT BASIN OR (CUSTOM PROCEDURE TRAY) ×3 IMPLANT
KIT ROOM TURNOVER OR (KITS) ×3 IMPLANT
KWIRE FX150X1.1XTROC TIP (WIRE) ×1 IMPLANT
MANIFOLD NEPTUNE WASTE (CANNULA) ×3 IMPLANT
NEEDLE 22X1 1/2 (OR ONLY) (NEEDLE) IMPLANT
NS IRRIG 1000ML POUR BTL (IV SOLUTION) ×3 IMPLANT
PACK ORTHO EXTREMITY (CUSTOM PROCEDURE TRAY) ×3 IMPLANT
PAD ABD 8X10 STRL (GAUZE/BANDAGES/DRESSINGS) ×3 IMPLANT
PAD ARMBOARD 7.5X6 YLW CONV (MISCELLANEOUS) ×6 IMPLANT
PAD CAST 4YDX4 CTTN HI CHSV (CAST SUPPLIES) ×1 IMPLANT
PADDING CAST COTTON 4X4 STRL (CAST SUPPLIES) ×2
PADDING CAST COTTON 6X4 STRL (CAST SUPPLIES) ×3 IMPLANT
SCREW CANN RATTLER 5X20 (Screw) ×6 IMPLANT
SOAP 2 % CHG 4 OZ (WOUND CARE) ×3 IMPLANT
SPLINT PLASTER CAST XFAST 5X30 (CAST SUPPLIES) ×1 IMPLANT
SPLINT PLASTER XFAST SET 5X30 (CAST SUPPLIES) ×2
SPONGE GAUZE 4X4 12PLY STER LF (GAUZE/BANDAGES/DRESSINGS) ×3 IMPLANT
SPONGE LAP 18X18 X RAY DECT (DISPOSABLE) IMPLANT
SUCTION FRAZIER HANDLE 10FR (MISCELLANEOUS) ×2
SUCTION TUBE FRAZIER 10FR DISP (MISCELLANEOUS) ×1 IMPLANT
SUT MNCRL AB 3-0 PS2 18 (SUTURE) ×6 IMPLANT
SUT PROLENE 3 0 PS 2 (SUTURE) ×3 IMPLANT
SUT VIC AB 0 CT1 27 (SUTURE) ×2
SUT VIC AB 0 CT1 27XBRD ANBCTR (SUTURE) ×1 IMPLANT
SUT VIC AB 1 CT1 27 (SUTURE) ×6
SUT VIC AB 1 CT1 27XBRD ANBCTR (SUTURE) ×3 IMPLANT
SUT VIC AB 2-0 CT1 27 (SUTURE) ×6
SUT VIC AB 2-0 CT1 TAPERPNT 27 (SUTURE) ×3 IMPLANT
SYR CONTROL 10ML LL (SYRINGE) IMPLANT
TOWEL OR 17X24 6PK STRL BLUE (TOWEL DISPOSABLE) ×3 IMPLANT
TOWEL OR 17X26 10 PK STRL BLUE (TOWEL DISPOSABLE) ×3 IMPLANT
TUBE CONNECTING 12'X1/4 (SUCTIONS) ×1
TUBE CONNECTING 12X1/4 (SUCTIONS) ×2 IMPLANT
YANKAUER SUCT BULB TIP NO VENT (SUCTIONS) ×3 IMPLANT

## 2016-01-29 NOTE — Anesthesia Procedure Notes (Signed)
Anesthesia Regional Block:  Popliteal block  Pre-Anesthetic Checklist: ,, timeout performed, Correct Patient, Correct Site, Correct Laterality, Correct Procedure, Correct Position, site marked, Risks and benefits discussed,  Surgical consent,  Pre-op evaluation,  At surgeon's request and post-op pain management  Laterality: Right  Prep: chloraprep       Needles:  Injection technique: Single-shot  Needle Type: Echogenic Needle     Needle Length: 9cm 9 cm Needle Gauge: 21 and 21 G    Additional Needles:  Procedures: ultrasound guided (picture in chart) Popliteal block Narrative:  Injection made incrementally with aspirations every 5 mL.  Performed by: Personally  Anesthesiologist: Catalina Gravel  Additional Notes: No pain on injection. No increased resistance to injection. Injection made in 5cc increments.  Good needle visualization.  Patient tolerated procedure well.

## 2016-01-29 NOTE — H&P (Signed)
Linda Welch is an 35 y.o. female.   Chief Complaint: right ankle pain HPI: 36 y/o female with right achilles avulsion and chronic achilles tendonopathy.  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  She presents today for a right achilles tendon reconstruction with Haglund excision, gastroc recession and possible FHL transfer to the calcaneus.  Past Medical History:  Diagnosis Date  . Allergy   . Anxiety   . Cancer [facility])    "thyroid cancer - thyroidectomy January 2016"  . Complication of anesthesia    "post operative myalgia"  . Depression   . Difficult intubation    "due to limited neck mobility"  . GERD (gastroesophageal reflux disease)    "not taking Prilosec any longer"  . Hashimoto's thyroiditis 2010  . Headache    migraines- usually x1 weekly  . History of bronchitis   . History of kidney stones    lithotripsy  . History of pneumonia   . PCOS (polycystic ovarian syndrome) 2003   takes Metformin for PCOS  . Sleep apnea, obstructive    cpap nightly-settings 16  . Stress incontinence   . Wears glasses     Past Surgical History:  Procedure Laterality Date  . EXTRACORPOREAL SHOCK WAVE LITHOTRIPSY Left    left  . THYROID LOBECTOMY Right 05/16/2014   Procedure: RIGHT THYROID LOBECTOMY;  Surgeon: Armandina Gemma, MD;  Location: [facility];  Service: General;  Laterality: Right;  . THYROIDECTOMY N/A 06/01/2014   Procedure: COMPLETION THYROIDECTOMY;  Surgeon: Armandina Gemma, MD;  Location: [facility];  Service: General;  Laterality: N/A;  . TONSILECTOMY, ADENOIDECTOMY, BILATERAL MYRINGOTOMY AND TUBES     1984 and 1989  . TONSILLECTOMY      Family History  Problem Relation Age of Onset  . Thyroid disease Mother     thyroid goiter  . Depression Mother   . Hyperlipidemia Mother   . Hypertension Mother   . Heart disease Mother     triple bypass  . Heart disease Father   . Hyperlipidemia Father   . Hypertension Father   . Sleep apnea Father   . Asthma Brother   .  Depression Brother   . Heart disease Maternal Grandmother   . Hyperlipidemia Maternal Grandmother   . Heart disease Maternal Grandfather   . Hyperlipidemia Maternal Grandfather   . Heart disease Paternal Grandmother   . Hyperlipidemia Paternal Grandmother   . Heart disease Paternal Grandfather   . Hyperlipidemia Paternal Grandfather   . Diabetes Paternal Grandfather   . Sleep apnea Paternal Grandfather    Social History:  reports that she quit smoking about 17 years ago. Her smoking use included Cigarettes. She has a 0.50 pack-year smoking history. She has never used smokeless tobacco. She reports that she does not drink alcohol or use drugs.  Allergies:  Allergies  Allergen Reactions  . Tioconazole Itching, Swelling and Other (See Comments)    Burning (severe)  . Tape Hives and Swelling    Adhesive tapes    Medications Prior to Admission  Medication Sig Dispense Refill  . cetirizine (ZYRTEC) 10 MG tablet Take 10 mg by mouth daily.     . Cholecalciferol (VITAMIN D3) 5000 units TABS Take 5,000 Units by mouth daily.    . metFORMIN (GLUCOPHAGE-XR) 750 MG 24 hr tablet Take 750 mg by mouth daily with breakfast.    . montelukast (SINGULAIR) 10 MG tablet Take 1 tablet (10 mg total) by mouth daily. 90 tablet 3  . sertraline (ZOLOFT)  100 MG tablet Take 1 tablet (100 mg total) by mouth daily. 90 tablet 3  . SYNTHROID 175 MCG tablet Take 1 tablet (175 mcg total) by mouth daily before breakfast. 45 tablet 1  . Cholecalciferol (VITAMIN D) 2000 units CAPS Take 1 capsule (2,000 Units total) by mouth daily. (Patient not taking: Reported on 01/21/2016) 30 capsule   . metFORMIN (GLUCOPHAGE-XR) 750 MG 24 hr tablet TAKE 1 TABLET ORAL, TWO TIMES DAILY (Patient not taking: Reported on 01/21/2016) 60 tablet 1  . omeprazole (PRILOSEC) 20 MG capsule Take 1 capsule (20 mg total) by mouth every other day. (tapering as per Dr. Chinita Greenland last note) (Patient not taking: Reported on 01/21/2016) 15 capsule 0     Results for orders placed or performed during the hospital encounter of 02/16/16 (from the past 48 hour(s))  Glucose, capillary     Status: None   Collection Time: 02-16-16  1:20 PM  Result Value Ref Range   Glucose-Capillary 85 65 - 99 mg/dL   No results found.  ROS  No recent f/c/n/v/wt loss  Blood pressure (!) 133/59, pulse 61, temperature 98.4 F (36.9 C), temperature source Oral, resp. rate 20, height 5\' 3"  (1.6 m), weight (!) 161 kg (355 lb), last menstrual period 01/07/2016, SpO2 95 %. Physical Exam  Obese female in nad.  A and o x 4.  Mood and affect normal.  EOMI.  Resp unlabored.  R achilles with palpable defect at insertion.  Skin healthy and intact.  4/5 strength in PF.  No lymphadenopathy.  Sens to LT intact at the ankle.  2+ dp and pt pulses.  Assessment/Plan R achilles avulsion, Haglund deformity, tight heelcord and chronic tendonopathy - to OR for surgical treatment. The risks and benefits of the alternative treatment options have been discussed in detail.  The patient wishes to proceed with surgery and specifically understands risks of bleeding, infection, nerve damage, blood clots, need for additional surgery, amputation and death.   Wylene Simmer, MD 02/16/2016, 3:05 PM

## 2016-01-29 NOTE — Brief Op Note (Signed)
01/29/2016  5:45 PM  PATIENT:  Linda Welch  35 y.o. female  PRE-OPERATIVE DIAGNOSIS: 1.  Right achilles avulsion      2.  Right haglund deformity      3.  Tight right heelcord  POST-OPERATIVE DIAGNOSIS:   1.  Right achilles rupture      2.  Chronic right achilles tendonopathy      3.  Right haglund deformity      Procedure(s): 1.  Right achilles secondary repair with plantaris autograft 2.  Deep transfer of flexor hallucis tendon to calcaneus 3.  Excision of Haglund deformity  SURGEON:  Wylene Simmer, MD  ASSISTANT: Mechele Claude, PA-C  ANESTHESIA:   General, regional  EBL:  minimal   TOURNIQUET:   Total Tourniquet Time Documented: Thigh (Right) - 68 minutes Total: Thigh (Right) - 68 minutes  COMPLICATIONS:  None apparent  DISPOSITION:  Extubated, awake and stable to recovery.  DICTATION ID:  [id number]

## 2016-01-29 NOTE — Transfer of Care (Signed)
Immediate Anesthesia Transfer of Care Note  Patient: Breah Defusco Worrell  Procedure(s) Performed: Procedure(s): RIGHT ACHILLES  RECONSTRUCTION (N/A) GASTROC RECESSION POSSIBLE FLEXIBLE FLEXOR HALGUS LONGUS TRANSER (N/A)  Patient Location: PACU  Anesthesia Type:General  Level of Consciousness: awake, alert  and oriented  Airway & Oxygen Therapy: Patient Spontanous Breathing and Patient connected to face mask oxygen  Post-op Assessment: Report given to RN, Post -op Vital signs reviewed and stable and Patient moving all extremities  Post vital signs: Reviewed and stable  Last Vitals:  Vitals:   01/29/16 1415 01/29/16 1738  BP: (!) 133/59 132/84  Pulse: 61 (!) 109  Resp: 20 19  Temp:  36.8 C    Last Pain:  Vitals:   01/29/16 1237  TempSrc: Oral      Patients Stated Pain Goal: 0 ([id number])  Complications: No apparent anesthesia complications

## 2016-01-29 NOTE — Anesthesia Preprocedure Evaluation (Addendum)
Anesthesia Evaluation  Patient identified by MRN, date of birth, ID band Patient awake    Reviewed: Allergy & Precautions, NPO status , Patient's Chart, lab work & pertinent test results  History of Anesthesia Complications (+) DIFFICULT AIRWAY and history of anesthetic complications (postoperative myalgias)  Airway Mallampati: III  TM Distance: >3 FB Neck ROM: Full  Mouth opening: Limited Mouth Opening  Dental no notable dental hx. (+) Dental Advisory Given, Teeth Intact, Implants,    Pulmonary sleep apnea and Continuous Positive Airway Pressure Ventilation , former smoker,    Pulmonary exam normal breath sounds clear to auscultation       Cardiovascular Exercise Tolerance: Good negative cardio ROS Normal cardiovascular exam Rhythm:Regular Rate:Normal     Neuro/Psych  Headaches, PSYCHIATRIC DISORDERS Anxiety Depression    GI/Hepatic Neg liver ROS, GERD  Medicated and Controlled,  Endo/Other  Hypothyroidism Morbid obesitySuper morbid obesity   Renal/GU negative Renal ROS  negative genitourinary   Musculoskeletal negative musculoskeletal ROS (+)   Abdominal   Peds negative pediatric ROS (+)  Hematology negative hematology ROS (+)   Anesthesia Other Findings Day of surgery medications reviewed with the patient.  Reproductive/Obstetrics negative OB ROS                           Anesthesia Physical  Anesthesia Plan  ASA: IV  Anesthesia Plan: General and Regional   Post-op Pain Management:  Regional for Post-op pain   Induction: Intravenous  Airway Management Planned: Oral ETT and Video Laryngoscope Planned  Additional Equipment:   Intra-op Plan:   Post-operative Plan: Extubation in OR  Informed Consent: I have reviewed the patients History and Physical, chart, labs and discussed the procedure including the risks, benefits and alternatives for the proposed anesthesia with the  patient or authorized representative who has indicated his/her understanding and acceptance.   Dental advisory given  Plan Discussed with: CRNA  Anesthesia Plan Comments: (Risks/benefits of general anesthesia discussed with patient including risk of damage to teeth, lips, gum, and tongue, nausea/vomiting, allergic reactions to medications, and the possibility of heart attack, stroke and death.  All patient questions answered.  Patient wishes to proceed.)       Anesthesia Quick Evaluation

## 2016-01-29 NOTE — Discharge Instructions (Signed)
John Hewitt, MD °[facility] ° °Please read the following information regarding your care after surgery. ° °Medications  °You only need a prescription for the narcotic pain medicine (ex. oxycodone, Percocet, Norco).  All of the other medicines listed below are available over the counter. °X acetominophen (Tylenol) 650 mg every 4-6 hours as you need for minor pain °X oxycodone as prescribed for moderate to severe pain °  ° °Narcotic pain medicine (ex. oxycodone, Percocet, Vicodin) will cause constipation.  To prevent this problem, take the following medicines while you are taking any pain medicine. °X docusate sodium (Colace) 100 mg twice a day X senna (Senokot) 2 tablets twice a day ° °X To help prevent blood clots, take a baby aspirin (81 mg) twice a day for two weeks after surgery.  You should also get up every hour while you are awake to move around.   ° °Weight Bearing °X Do not bear any weight on the operated leg or foot. ° °Cast / Splint / Dressing °X Keep your splint or cast clean and dry.  Don’t put anything (coat hanger, pencil, etc) down inside of it.  If it gets damp, use a hair dryer on the cool setting to dry it.  If it gets soaked, call the office to schedule an appointment for a cast change. ° ° °After your dressing, cast or splint is removed; you may shower, but do not soak or scrub the wound.  Allow the water to run over it, and then gently pat it dry. ° °Swelling °It is normal for you to have swelling where you had surgery.  To reduce swelling and pain, keep your toes above your nose for at least 3 days after surgery.  It may be necessary to keep your foot or leg elevated for several weeks.  If it hurts, it should be elevated. ° °Follow Up °Call my office at [phone] when you are discharged from the hospital or surgery center to schedule an appointment to be seen two weeks after surgery. ° °Call my office at [phone] if you develop a fever >101.5° F, nausea, vomiting, bleeding  from the surgical site or severe pain.   ° ° °

## 2016-01-29 NOTE — Anesthesia Procedure Notes (Signed)
Procedure Name: Intubation Date/Time: 01/29/2016 3:43 PM Performed by: Jenne Campus Pre-anesthesia Checklist: Patient identified, Emergency Drugs available, Suction available, Patient being monitored and Timeout performed Patient Re-evaluated:Patient Re-evaluated prior to inductionOxygen Delivery Method: Circle system utilized Preoxygenation: Pre-oxygenation with 100% oxygen Intubation Type: IV induction Ventilation: Mask ventilation without difficulty Laryngoscope Size: Glidescope and 3 Grade View: Grade I Tube type: Oral Tube size: 7.0 mm Number of attempts: 1 Airway Equipment and Method: Stylet and Video-laryngoscopy Placement Confirmation: ETT inserted through vocal cords under direct vision,  breath sounds checked- equal and bilateral and CO2 detector Secured at: 22 cm Tube secured with: Tape Dental Injury: Teeth and Oropharynx as per pre-operative assessment  Difficulty Due To: Difficult Airway- due to large tongue, Difficult Airway- due to reduced neck mobility, Difficult Airway- due to limited oral opening and Difficulty was anticipated Future Recommendations: Recommend- induction with short-acting agent, and alternative techniques readily available

## 2016-01-30 NOTE — Op Note (Signed)
NAMEMARSHAWN, ARNER               ACCOUNT NO.:  [id number]  MEDICAL RECORD NO.:  [id number]  LOCATION:  [facility]                         FACILITY:  [facility]  PHYSICIAN:  Wylene Simmer, MD        DATE OF BIRTH:  [birth year]  DATE OF PROCEDURE:  01/29/2016 DATE OF DISCHARGE:  01/29/2016                              OPERATIVE REPORT   PREOPERATIVE DIAGNOSES: 1. Right Achilles tendon evulsion from the calcaneus. 2. Right Haglund deformity. 3. Tight right heel cord.  POSTOPERATIVE DIAGNOSES: 1. Right Achilles tendon rupture. 2. Chronic right Achilles tendinopathy with calcification. 3. Right Haglund deformity.  PROCEDURE: 1. Right Achilles tendon secondary repair with plantaris autograft. 2. Deep transfer of flexor hallucis longus tendon to the calcaneus. 3. Excision of Haglund deformity.  SURGEON:  Wylene Simmer, MD.  ASSISTANT:  Mechele Claude, PA-C.  ANESTHESIA:  General, regional.  ESTIMATED BLOOD LOSS:  Minimal.  TOURNIQUET TIME:  68 minutes at 350 mmHg.  COMPLICATIONS:  None apparent.  DISPOSITION:  Extubated, awake, and stable to recovery.  INDICATIONS FOR PROCEDURE:  The patient is a morbidly obese 35 year old woman who sustained a right ankle injury at work.  She was found in the office to have evidence of Achilles tendon avulsion.  She also had signs and symptoms of chronic Achilles tendinopathy.  She presents now for operative treatment of this injury.  She understands the risks and benefits, the alternative treatment options, and elects surgical treatment.  She specifically understands risks of bleeding, infection, nerve damage, blood clots, need for additional surgery, continued pain, amputation and death.  PROCEDURE IN DETAIL:  After preoperative consent was obtained, the correct operative site was identified, the patient was brought to the operating room  supine on a stretcher.  General anesthesia was induced. Preoperative antibiotics were administered.  Surgical  time-out was taken.  The patient was then turned into prone position on the operating table.  The right lower extremity was then prepped and draped in standard sterile fashion.  The extremity was exsanguinated and tourniquet was inflated to 350 mmHg.  A longitudinal incision was then made over the Achilles tendon insertion.  Sharp dissection was carried down through skin and subcutaneous tissue and paratenon.  The site of the rupture was identified.  It was noted to not be avulsed but rather to have ruptured at the site of a large calcification within the tendon. The tendon distally was intact to the insertion on the calcaneus. Proximally, the stump was identified with large calcification centrally. The calcification was dissected free from the tendon in subperiosteal fashion and removed in its entirety.  A second calcification was removed adjacent to the first.  This left a large oval defect within the tendon at the site of the rupture.  The distal portion of the tendon was then split longitudinally exposing the large Haglund deformity.  The Haglund deformity was removed with a rongeur creating a footprint of healthy bleeding bone for transfer of the flexor hallucis longus tendon.  The deep fascia was incised longitudinally.  The FHL tendon was identified. Tibial nerve and posterior tibial artery were protected and the FHL was released down in the bony tunnel.  A whipstitch was  placed in the tendon ends.  The tendon was measured and a Biomet 5 mm x 20 mm Rattler interference screw was selected.  A 5.7-mm hole was then drilled in the calcaneus at the Haglund footprint.  A Beath pin was used to pull the tendon down into the tunnel and the anchor was inserted securing the flexor hallucis longus to the calcaneus.  The plantaris tendon was then identified.  It was released proximally with an open end tendon stripper.  The Achilles was then repaired on either side with #1  Vicryl figure-of-eight sutures.  The plantaris tendon was then woven from the proximal tendon stump and back down through the distal stump twice.  The tendon repair and plantaris tendon transfer were both then oversewn with a 3-0 Monocryl Silfverskiold suture.  Wound was irrigated copiously. The paratenon was repaired with inverted simple sutures of 2-0 Vicryl. Skin incision was closed with horizontal mattress sutures of 2-0 nylon. Sterile dressings were applied followed by a well-padded short-leg splint.  Tourniquet was released after application of dressings at 68 minutes.  The patient was awakened from anesthesia and transported to the recovery room in stable condition.  FOLLOWUP PLAN:  The patient will be nonweightbearing on the right lower extremity.  She will follow up with me in the office in 2 weeks for suture removal and likely placement of sequential cast to stretch the Achilles back out slowly.  She will take aspirin for DVT prophylaxis.  Mechele Claude, PA-C was present and scrubbed for the duration of the case.  His assistance was essential in positioning the patient, prepping and draping, gaining and maintaining exposure, performing the operation, closing and dressing the wounds, and applying the splint.     Wylene Simmer, MD     JH/MEDQ  D:  01/29/2016  T:  01/30/2016  Job:  [id number]

## 2016-01-31 ENCOUNTER — Encounter (HOSPITAL_COMMUNITY): Payer: Self-pay | Admitting: Orthopedic Surgery

## 2016-02-04 NOTE — Anesthesia Postprocedure Evaluation (Signed)
Anesthesia Post Note  Patient: Teandra Milanowski Sachs  Procedure(s) Performed: Procedure(s) (LRB): RIGHT ACHILLES  RECONSTRUCTION (N/A) GASTROC RECESSION POSSIBLE FLEXIBLE FLEXOR HALGUS LONGUS TRANSER (N/A)  Patient location during evaluation: PACU Anesthesia Type: General and Regional Level of consciousness: awake Pain management: pain level controlled Vital Signs Assessment: post-procedure vital signs reviewed and stable Respiratory status: spontaneous breathing Cardiovascular status: stable Postop Assessment: no signs of nausea or vomiting Anesthetic complications: no    Last Vitals:  Vitals:   01/29/16 1753 01/29/16 1808  BP: 133/90 (!) 145/90  Pulse: 95 97  Resp: 10 14  Temp:      Last Pain:  Vitals:   01/29/16 1237  TempSrc: Oral                 Cruzita Lipa

## 2016-02-11 ENCOUNTER — Other Ambulatory Visit: Payer: Self-pay

## 2016-02-12 MED ORDER — METFORMIN HCL ER 750 MG PO TB24: 750.0000 mg | ORAL_TABLET | Freq: Two times a day (BID) | ORAL | 1 refills | Status: DC

## 2016-02-12 NOTE — Telephone Encounter (Signed)
Metformin glucophage XR is in the med list twice; I need to know if she's taking it once a day or twice a day please; thanks!

## 2016-02-12 NOTE — Telephone Encounter (Signed)
Thank you, I removed the other one; Rx sent

## 2016-02-12 NOTE — Telephone Encounter (Signed)
Last Cr and A1c reviewed Med list says she's not taking this any more Please call patient and update her med list with her See if she is still taking metformin, and if so, how much exactly

## 2016-02-12 NOTE — Telephone Encounter (Signed)
Pt stated she was taking 750mg  2 tablets qd.

## 2016-02-12 NOTE — Telephone Encounter (Signed)
Called clarified with Pt she is still on this medication same dose & directions.

## 2016-02-15 ENCOUNTER — Ambulatory Visit (INDEPENDENT_AMBULATORY_CARE_PROVIDER_SITE_OTHER): Payer: Managed Care, Other (non HMO) | Admitting: Family Medicine

## 2016-02-15 ENCOUNTER — Encounter: Payer: Self-pay | Admitting: Family Medicine

## 2016-02-15 DIAGNOSIS — J019 Acute sinusitis, unspecified: Secondary | ICD-10-CM

## 2016-02-15 MED ORDER — PREDNISONE 10 MG (21) PO TBPK: 10.0000 mg | ORAL_TABLET | Freq: Every day | ORAL | 0 refills | Status: DC

## 2016-02-15 MED ORDER — AZITHROMYCIN 250 MG PO TABS: ORAL_TABLET | ORAL | 0 refills | Status: DC

## 2016-02-15 NOTE — Progress Notes (Signed)
Name: Linda Welch   MRN: [id number]    DOB: Mar 13, 1981   Date:02/15/2016       Progress Note  Subjective  Chief Complaint  Chief Complaint  Patient presents with  . Nasal Congestion   This patient is usually followed by Dr. Sanda Klein, new to me HPI  Pt. Presents for evaluation of chest congestion, head congestion, and ears feeling stopped up. Ear stuffiness started on the left side and went to the right side. She has had no fevers, chills etc. Mild coughing with wheezing. She has finished a 5-day course of Amoxicillin-Clavulanate prescribed by [facility] but is no better.    Past Medical History:  Diagnosis Date  . Allergy   . Anxiety   . Cancer [facility])    "thyroid cancer - thyroidectomy January 2016"  . Complication of anesthesia    "post operative myalgia"  . Depression   . Difficult intubation    "due to limited neck mobility"  . GERD (gastroesophageal reflux disease)    "not taking Prilosec any longer"  . Hashimoto's thyroiditis 2010  . Headache    migraines- usually x1 weekly  . History of bronchitis   . History of kidney stones    lithotripsy  . History of pneumonia   . PCOS (polycystic ovarian syndrome) 2003   takes Metformin for PCOS  . Sleep apnea, obstructive    cpap nightly-settings 16  . Stress incontinence   . Wears glasses     Past Surgical History:  Procedure Laterality Date  . ACHILLES TENDON SURGERY N/A 01/29/2016   Procedure: RIGHT ACHILLES  RECONSTRUCTION;  Surgeon: Wylene Simmer, MD;  Location: [facility];  Service: Orthopedics;  Laterality: N/A;  . EXTRACORPOREAL SHOCK WAVE LITHOTRIPSY Left    left  . GASTROCNEMIUS RECESSION N/A 01/29/2016   Procedure: GASTROC RECESSION POSSIBLE FLEXIBLE FLEXOR HALGUS LONGUS TRANSER;  Surgeon: Wylene Simmer, MD;  Location: [place];  Service: Orthopedics;  Laterality: N/A;  . THYROID LOBECTOMY Right 05/16/2014   Procedure: RIGHT THYROID LOBECTOMY;  Surgeon: Armandina Gemma, MD;  Location: [facility];  Service: General;  Laterality: Right;   . THYROIDECTOMY N/A 06/01/2014   Procedure: COMPLETION THYROIDECTOMY;  Surgeon: Armandina Gemma, MD;  Location: [facility];  Service: General;  Laterality: N/A;  . TONSILECTOMY, ADENOIDECTOMY, BILATERAL MYRINGOTOMY AND TUBES     1984 and 1989  . TONSILLECTOMY      Family History  Problem Relation Age of Onset  . Thyroid disease Mother     thyroid goiter  . Depression Mother   . Hyperlipidemia Mother   . Hypertension Mother   . Heart disease Mother     triple bypass  . Heart disease Father   . Hyperlipidemia Father   . Hypertension Father   . Sleep apnea Father   . Asthma Brother   . Depression Brother   . Heart disease Maternal Grandmother   . Hyperlipidemia Maternal Grandmother   . Heart disease Maternal Grandfather   . Hyperlipidemia Maternal Grandfather   . Heart disease Paternal Grandmother   . Hyperlipidemia Paternal Grandmother   . Heart disease Paternal Grandfather   . Hyperlipidemia Paternal Grandfather   . Diabetes Paternal Grandfather   . Sleep apnea Paternal Grandfather     Social History   Social History  . Marital status: Married    Spouse name: N/A  . Number of children: N/A  . Years of education: N/A   Occupational History  . Not on file.   Social History Main Topics  .  Smoking status: Former Smoker    Packs/day: 0.25    Years: 2.00    Types: Cigarettes    Quit date: 05/26/1998  . Smokeless tobacco: Never Used     Comment: "quit smoking in 2000 - smoked for 2 years"   . Alcohol use No  . Drug use: No  . Sexual activity: Yes   Other Topics Concern  . Not on file   Social History Narrative  . No narrative on file     Current Outpatient Prescriptions:  .  aspirin EC 81 MG tablet, Take 1 tablet (81 mg total) by mouth 2 (two) times daily., Disp: 28 tablet, Rfl: 0 .  cetirizine (ZYRTEC) 10 MG tablet, Take 10 mg by mouth daily. , Disp: , Rfl:  .  Cholecalciferol (VITAMIN D3) 5000 units TABS, Take 5,000 Units by mouth daily., Disp: , Rfl:  .   metFORMIN (GLUCOPHAGE-XR) 750 MG 24 hr tablet, Take 1 tablet (750 mg total) by mouth 2 (two) times daily., Disp: 60 tablet, Rfl: 1 .  montelukast (SINGULAIR) 10 MG tablet, Take 1 tablet (10 mg total) by mouth daily., Disp: 90 tablet, Rfl: 3 .  sertraline (ZOLOFT) 100 MG tablet, Take 1 tablet (100 mg total) by mouth daily., Disp: 90 tablet, Rfl: 3 .  SYNTHROID 175 MCG tablet, Take 1 tablet (175 mcg total) by mouth daily before breakfast., Disp: 45 tablet, Rfl: 1 .  amoxicillin-clavulanate (AUGMENTIN) 875-125 MG tablet, , Disp: , Rfl:  .  Cholecalciferol (VITAMIN D) 2000 units CAPS, Take 1 capsule (2,000 Units total) by mouth daily. (Patient not taking: Reported on 02/15/2016), Disp: 30 capsule, Rfl:  .  docusate sodium (COLACE) 100 MG capsule, Take 1 capsule (100 mg total) by mouth 2 (two) times daily. While taking narcotic pain medicine. (Patient not taking: Reported on 02/15/2016), Disp: 30 capsule, Rfl: 0 .  fluconazole (DIFLUCAN) 150 MG tablet, , Disp: , Rfl:  .  omeprazole (PRILOSEC) 20 MG capsule, Take 1 capsule (20 mg total) by mouth every other day. (tapering as per Dr. Chinita Greenland last note) (Patient not taking: Reported on 02/15/2016), Disp: 15 capsule, Rfl: 0 .  oxyCODONE (ROXICODONE) 5 MG immediate release tablet, Take 1-2 tablets (5-10 mg total) by mouth every 4 (four) hours as needed for moderate pain or severe pain. (Patient not taking: Reported on 02/15/2016), Disp: 30 tablet, Rfl: 0 .  senna (SENOKOT) 8.6 MG TABS tablet, Take 2 tablets (17.2 mg total) by mouth 2 (two) times daily. (Patient not taking: Reported on 02/15/2016), Disp: 30 each, Rfl: 0  Allergies  Allergen Reactions  . Tioconazole Itching, Swelling and Other (See Comments)    Burning (severe)  . Tape Hives and Swelling    Adhesive tapes     Review of Systems  Constitutional: Negative for chills and fever.  HENT: Positive for congestion, ear pain, hearing loss and sore throat.   Respiratory: Positive for cough, sputum  production and wheezing.   Cardiovascular: Negative for chest pain.    Objective  Vitals:   02/15/16 1005  BP: 133/78  Pulse: (!) 115  Resp: 17  Temp: 99.3 F (37.4 C)  TempSrc: Oral  SpO2: 95%  Height: 5\' 3"  (1.6 m)    Physical Exam  Constitutional: She is well-developed, well-nourished, and in no distress.  HENT:  Head: Normocephalic and atraumatic.  Right Ear: No drainage or swelling.  Left Ear: No drainage, swelling or tenderness.  No middle ear effusion.  Nose: Right sinus exhibits no maxillary sinus tenderness and  no frontal sinus tenderness. Left sinus exhibits no maxillary sinus tenderness and no frontal sinus tenderness.  Mouth/Throat: No posterior oropharyngeal erythema.  Right ear canal mildly erythematous, no drainage, cerumen blocks view of TM. Left ear canal normal, TM gray Nasal turbinates inflamed and hypertrophied.    Cardiovascular: Normal rate, regular rhythm, S1 normal and S2 normal.   No murmur heard. Pulmonary/Chest: Effort normal and breath sounds normal. She has no rhonchi.  Psychiatric: Mood, memory, affect and judgment normal.  Nursing note and vitals reviewed.     Assessment & Plan  1. Acute sinusitis with symptoms greater than 10 days Increase antimicrobial coverage with azithromycin, start on prednisone at 60 mg, tapering down to 10 mg and then stopping over 6 days. Asked to contact podiatrist to make sure that it is okay to take prednisone in view of her recent foot surgery. - azithromycin (ZITHROMAX) 250 MG tablet; 2 tabs po day 1, then 1 tab po q day x 4 days  Dispense: 6 tablet; Refill: 0 - predniSONE (STERAPRED UNI-PAK 21 TAB) 10 MG (21) TBPK tablet; Take 1 tablet (10 mg total) by mouth daily. 60 50 40 30 20 10  then STOP  Dispense: 21 tablet; Refill: 0   Adleigh Mcmasters [facility] 02/15/2016 10:22 AM

## 2016-03-23 ENCOUNTER — Other Ambulatory Visit: Payer: Self-pay | Admitting: Internal Medicine

## 2016-04-03 ENCOUNTER — Other Ambulatory Visit: Payer: Self-pay | Admitting: Family Medicine

## 2016-04-13 ENCOUNTER — Other Ambulatory Visit: Payer: Self-pay | Admitting: Family Medicine

## 2016-04-14 NOTE — Telephone Encounter (Signed)
Pt has an appt in about 3.5 weeks Last creatinine reviewed; Rx approved

## 2016-04-21 ENCOUNTER — Other Ambulatory Visit: Payer: Self-pay | Admitting: Internal Medicine

## 2016-05-06 ENCOUNTER — Ambulatory Visit: Payer: Managed Care, Other (non HMO) | Admitting: Family Medicine

## 2016-05-23 ENCOUNTER — Other Ambulatory Visit: Payer: Self-pay | Admitting: Internal Medicine

## 2016-05-23 ENCOUNTER — Other Ambulatory Visit: Payer: Self-pay | Admitting: Family Medicine

## 2016-05-28 ENCOUNTER — Ambulatory Visit: Payer: Managed Care, Other (non HMO) | Admitting: Gastroenterology

## 2016-05-28 NOTE — Telephone Encounter (Signed)
Glitch in our system; I was not receiving electronic refill requests from Dec 13 until yesterday; addressed with IT; addressing now ------------------------------------ Last BMP reviewed Please ask patient to schedule an appt for a follow-up visit with me I sent Rx as requested Thank you

## 2016-05-28 NOTE — Telephone Encounter (Signed)
LMOM to inform pt to call the office °

## 2016-06-23 ENCOUNTER — Encounter: Payer: Self-pay | Admitting: Internal Medicine

## 2016-06-23 ENCOUNTER — Ambulatory Visit (INDEPENDENT_AMBULATORY_CARE_PROVIDER_SITE_OTHER): Payer: Managed Care, Other (non HMO) | Admitting: Internal Medicine

## 2016-06-23 VITALS — BP 128/88 | HR 95 | Ht 63.0 in | Wt 352.0 lb

## 2016-06-23 DIAGNOSIS — E890 Postprocedural hypothyroidism: Secondary | ICD-10-CM

## 2016-06-23 DIAGNOSIS — E559 Vitamin D deficiency, unspecified: Secondary | ICD-10-CM

## 2016-06-23 DIAGNOSIS — C73 Malignant neoplasm of thyroid gland: Secondary | ICD-10-CM | POA: Diagnosis not present

## 2016-06-23 LAB — T4, FREE: FREE T4: 0.91 ng/dL (ref 0.60–1.60)

## 2016-06-23 LAB — TSH: TSH: 1.4 u[IU]/mL (ref 0.35–4.50)

## 2016-06-23 LAB — VITAMIN D 25 HYDROXY (VIT D DEFICIENCY, FRACTURES): VITD: 23.51 ng/mL — ABNORMAL LOW (ref 30.00–100.00)

## 2016-06-23 MED ORDER — SYNTHROID 175 MCG PO TABS
175.0000 ug | ORAL_TABLET | Freq: Every day | ORAL | 3 refills | Status: DC
Start: 2016-06-23 — End: 2016-12-23

## 2016-06-23 NOTE — Patient Instructions (Addendum)
Please stop at the lab.  Please continue Synthroid 175 mcg daily.  .Take the thyroid hormone every day, with water, at least 30 minutes before breakfast, separated by at least 4 hours from: - acid reflux medications - calcium - iron - multivitamins  Please come back for a follow-up appointment in 6 months.

## 2016-06-23 NOTE — Progress Notes (Signed)
Patient ID: Linda Welch, female   DOB: Oct 28, 1980, 36 y.o.   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 She is here with her husband and her son. Last visit 6 mo ago.   She fell at work 12/2015 >> avulsion fxs of heel and Achiles tendon rupture >> she had to have surgery. She is still in PT.   Follicular variant of papillary thyroid cancer: - dx in in 02/2009 >> 3 cm nodule felt by pt >> seen by PCP >> referred to endo >> uptake and scan >> cold nodule >> FNA by Dr Francoise Schaumann: benign (bad experience).  - thyroid U/S 2010: 3.1 x 2.6 x 2.2 cm complex, slightly hypoechoic, nodule, situated in the mid pole of the right thyroid lobe - Thyroid ultrasound 02/06/2014: Large nodule occupying most of the right lobe measures 5.2 x 3.2 x 4.1 cm. There are some internal calcifications. It previously measured 3.1 x 2.6 x 2.2 cm based on the prior report. - FNA 95/62/1308: FOLLICULAR NEOPLASM AND/OR LESION (BETHESDA IV)  - Right lobectomy 05/16/2014 and completion thyroidectomy 06/01/2014 Thyroid, lobectomy, right - FOLLICULAR VARIANT OF PAPILLARY THYROID CARCINOMA WITH CAPSULAR INVASION, 4.2 CM, CONFINED WITHIN THYROIDAL TISSUE. - NO EVIDENCE OF ANGIOLYMPHATIC INVASION IDENTIFIED. - RESECTION MARGINS, NEGATIVE FOR ATYPIA OR MALIGNANCY. Microscopic Comment THYROID Specimen: Right thyroid Procedure: Right hemithyroidectomy Specimen Integrity (intact/fragmented): Intact Tumor focality: Unifocal Maximum tumor size (cm): 4.2 cm, gross description Tumor laterality: Right thyroid gland Histologic type (including subtype and/or unique features as applicable): Follicular variant of papillary thyroid carcinoma Tumor capsule: Present Extrathyroidal extension: No Margins: Negative Lymph - Vascular invasion: Not identified Capsular invasion with degree of invasion if present: Present,  complete Lymph nodes: # examined N/A; # positive; N/A TNM code: pT3 , pNX Non-neoplastic thyroid: Unremarkable. Due to the low risk of her thyroid tumor, I did not recommend I-131 remnant ablation. Component     Latest Ref Rng & Units 12/24/2015  Thyroglobulin     2.8 - 40.9 ng/mL 0.6 (L)  Thyroglobulin Ab     <2 IU/mL <1  01/07/2016: Neck ultrasound: Surgical changes of total thyroidectomy without evidence of residual thyroid tissue, nodularity or lymphadenopathy.  Hypothyroidism: Pt. has been dx with hypothyroidism in 2010, now postoperative hypothyroidism; is on Synthroid 88 >> 100 >> 150 (since 09/2015) >> 175 (since 12/24/2015) mcg, taken: - fasting - with water or black coffee - separated by >30 min from b'fast  - + calcium at night - no iron, multivitamins - + PPIs -  at bedtime  I reviewed pt's thyroid tests: Lab Results  Component Value Date   TSH 7.61 (H) 12/24/2015   TSH 8.12 (H) 10/08/2015   TSH 14.04 (H) 08/13/2015   TSH 20.51 (H) 06/25/2015   TSH 26.78 (H) 12/28/2014   FREET4 0.94 12/24/2015   FREET4 0.84 10/08/2015   FREET4 0.72 08/13/2015   FREET4 0.68 06/25/2015   FREET4 0.66 12/28/2014  03/29/2013: TSH 0.93, fT4 1.03 02/04/2013 (towards end of pregnancy): TSH 0.12, fT4 0.96 >> Synthroid reduced from 100 to 88 mcg 12/2012: TSH 0.07 >> Synthroid reduced from 112 to 100 mcg  Pt describes: - + palpitations - no cold intolerance, but also hot flushes (mainly face) >> always - + weight gain (but wt loss per our scale) - + fatigue - no N/V, + D "all the time" and stomach cramps/+ C - no dry skin - +  hair loss, + itching - + mm aches and joint pain  - + low libido  Pt is not feeling nodules in neck, does have + hoarseness, no dysphagia/odynophagia, no SOB with lying down. She has slight neck discomfort - pulling sensation R neck.  She was diagnosed with vitamin D insufficiency (9) last summer>> started vitamin D 5000 units daily in 10/2015. She would want  me to check her level.  ROS: Constitutional: see HPI Eyes: + Occasional blurry vision, no xerophthalmia ENT: no sore throat, see HPI Cardiovascular: no CP/SOB/+ palpitations/no leg swelling Respiratory: + cough/no SOB Gastrointestinal: no N/V/+ D/+ C Musculoskeletal: + both: muscle/joint aches Skin: no rashes, + hair loss, + itching Neurological: no tremors/numbness/tingling/dizziness, + HAs  I reviewed pt's medications, allergies, PMH, social hx, family hx, and changes were documented in the history of present illness. Otherwise, unchanged from my initial visit note. Started vit D and Calcium.  PE: BP 128/88 (BP Location: Left Arm, Patient Position: Sitting)   Pulse 95   Ht '5\' 3"'  (1.6 m)   Wt (!) 352 lb (159.7 kg)   LMP 06/13/2016   SpO2 98%   BMI 62.35 kg/m  Wt Readings from Last 3 Encounters:  06/23/16 (!) 352 lb (159.7 kg)  01/29/16 (!) 355 lb (161 kg)  01/24/16 (!) 355 lb 4.8 oz (161.2 kg)   Constitutional: obese, in NAD Eyes: PERRLA, EOMI, no exophthalmos ENT: moist mucous membranes, + thyroid scar healed, no neck masses palpated, no cervical lymphadenopathy Cardiovascular: RRR, No MRG Respiratory: CTA B Gastrointestinal: abdomen soft, NT, ND, BS+ Musculoskeletal: no deformities, strength intact in all 4 Skin: moist, warm, no rashes; reddish scar behind right heel Neurological: no tremor with outstretched hands, DTR normal in all 4  ASSESSMENT: 1. Follicular variant of PTC  2. Postsurgical hypothyroidism  3. Vitamin D deficiency  PLAN:  1. PTC - she is healing well her total thyroidectomy- no swelling, pain or erythema, dysesthesia. She does have a pulling sensation on the right, however, she had this at last visit, and her neck ultrasound did not show any pathology. We discussed that this may be due to scar tissue or small nerve damage. - She has the least aggressive type of thyroid cancer, the follicular variant of papillary thyroid cancer. This was  encapsulated, and he did not spread outside the thyroid. Her prognosis is excellent. She is stage 1 TNM cancer (low grade). - no masses felt at neck palpation  - We reviewed together her previous thyroid U/S from 11/2015 >> no recurrence or metastasis - We also reviewed her Tg + ATA from 11/2015. The thyroglobulin is low, but not undetectable, as expected it since she did not have RAI treatment. We discussed that the most important information we will get from the thyroglobulin trend, rather than absolute value. - I will see her in 6 mo. We'll repeat thyroglobulin then.  2. Hypothyroidism Patient with post-op hypothyroidism, on Synthroid d.a.w. therapy. She appears euthyroid, but still has chronic fatigue and her TSH levels were high >> we increased LT4 dose  to 150 mcg daily in 09/2015 and then to 175 g daily in 11/2015 >> continues this now. She did not return for labs as advised in 5-6 weeks after the last dose change. - We discussed about correct intake of levothyroxine, fasting, with water, separated by at least 30 minutes from breakfast, and separated by more than 4 hours from calcium, iron, multivitamins, acid reflux medications (PPIs). She does take it correctly. - we'll check  thyroid tests today: TSH, free T4 - If these are abnormal, she will need to return in 5-6 weeks for repeat labs  - If these are normal, I will see her back in 6 months  3. Vitamin D deficiency - She had a very low vitamin D last summer, after which she was started on vitamin D supplement 5000 units daily - She would like me to recheck her levels today  Needs refills of Synthroid.  Component     Latest Ref Rng & Units 06/23/2016  TSH     0.35 - 4.50 uIU/mL 1.40  T4,Free(Direct)     0.60 - 1.60 ng/dL 0.91  VITD     30.00 - 100.00 ng/mL 23.51 (L)  Thyroid tests are great. We'll continue the current dose of Synthroid. Her dose of vitamin D is not sufficient. I will suggest to increase the dose to 8000 units  daily.  Philemon Kingdom, MD PhD [facility]

## 2016-06-25 ENCOUNTER — Other Ambulatory Visit: Payer: Self-pay | Admitting: Family Medicine

## 2016-06-25 NOTE — Telephone Encounter (Signed)
See 05/23/16 note I approved one month and asked that patient schedule an appointment I'll approved one more month, but please ask patient to schedule an appointment in the next month if we are to continue to prescribe her metformin Thank you

## 2016-06-26 NOTE — Telephone Encounter (Signed)
Pt will call back when she looks at her schedule

## 2016-07-15 ENCOUNTER — Emergency Department
Admission: EM | Admit: 2016-07-15 | Discharge: 2016-07-15 | Disposition: A | Discharge status: Home / Self Care | Payer: Managed Care, Other (non HMO) | Attending: Emergency Medicine | Admitting: Emergency Medicine

## 2016-07-15 ENCOUNTER — Encounter: Payer: Self-pay | Admitting: Internal Medicine

## 2016-07-15 ENCOUNTER — Emergency Department: Payer: Managed Care, Other (non HMO)

## 2016-07-15 DIAGNOSIS — I493 Ventricular premature depolarization: Secondary | ICD-10-CM | POA: Diagnosis not present

## 2016-07-15 DIAGNOSIS — Z79899 Other long term (current) drug therapy: Secondary | ICD-10-CM | POA: Diagnosis not present

## 2016-07-15 DIAGNOSIS — Z7984 Long term (current) use of oral hypoglycemic drugs: Secondary | ICD-10-CM | POA: Diagnosis not present

## 2016-07-15 DIAGNOSIS — Z85850 Personal history of malignant neoplasm of thyroid: Secondary | ICD-10-CM | POA: Diagnosis not present

## 2016-07-15 DIAGNOSIS — R002 Palpitations: Secondary | ICD-10-CM | POA: Diagnosis present

## 2016-07-15 DIAGNOSIS — Z87891 Personal history of nicotine dependence: Secondary | ICD-10-CM | POA: Insufficient documentation

## 2016-07-15 LAB — MAGNESIUM: MAGNESIUM: 2.1 mg/dL (ref 1.7–2.4)

## 2016-07-15 LAB — CBC
HCT: 37.4 % (ref 35.0–47.0)
HEMOGLOBIN: 12.9 g/dL (ref 12.0–16.0)
MCH: 28.7 pg (ref 26.0–34.0)
MCHC: 34.6 g/dL (ref 32.0–36.0)
MCV: 82.8 fL (ref 80.0–100.0)
Platelets: 264 10*3/uL (ref 150–440)
RBC: 4.51 MIL/uL (ref 3.80–5.20)
RDW: 15.1 % — ABNORMAL HIGH (ref 11.5–14.5)
WBC: 8.7 10*3/uL (ref 3.6–11.0)

## 2016-07-15 LAB — BASIC METABOLIC PANEL
ANION GAP: 6 (ref 5–15)
BUN: 12 mg/dL (ref 6–20)
CHLORIDE: 106 mmol/L (ref 101–111)
CO2: 27 mmol/L (ref 22–32)
Calcium: 8.5 mg/dL — ABNORMAL LOW (ref 8.9–10.3)
Creatinine, Ser: 0.78 mg/dL (ref 0.44–1.00)
GFR calc non Af Amer: >60 mL/min (ref >60)
Glucose, Bld: 116 mg/dL — ABNORMAL HIGH (ref 65–99)
POTASSIUM: 4.1 mmol/L (ref 3.5–5.1)
Sodium: 139 mmol/L (ref 135–145)

## 2016-07-15 LAB — TSH: TSH: 4.612 u[IU]/mL — ABNORMAL HIGH (ref 0.350–4.500)

## 2016-07-15 LAB — TROPONIN I: Troponin I: <0.03 ng/mL (ref <0.03)

## 2016-07-15 NOTE — ED Provider Notes (Signed)
[facility] Emergency Department Provider Note   First MD Initiated Contact with Patient 07/15/16 0245     (approximate)  I have reviewed the triage vital signs and the nursing notes.   HISTORY  Chief Complaint Palpitations and Chest Pain    HPI Linda Welch is a 36 y.o. female with below list of chronic medical conditions occluding thyroidectomy secondary to thyroid cancer presents to the emergency department with intermittent palpitation which patient states has been going on for "a while" (weeks)however been persistent today. Patient denies any chest pain no nausea or vomiting. Patient denies any dizziness   Past Medical History:  Diagnosis Date  . Allergy   . Anxiety   . Cancer [facility])    "thyroid cancer - thyroidectomy January 2016"  . Complication of anesthesia    "post operative myalgia"  . Depression   . Difficult intubation    "due to limited neck mobility"  . GERD (gastroesophageal reflux disease)    "not taking Prilosec any longer"  . Hashimoto's thyroiditis 2010  . Headache    migraines- usually x1 weekly  . History of bronchitis   . History of kidney stones    lithotripsy  . History of pneumonia   . PCOS (polycystic ovarian syndrome) 2003   takes Metformin for PCOS  . Sleep apnea, obstructive    cpap nightly-settings 16  . Stress incontinence   . Wears glasses     Patient Active Problem List   Diagnosis Date Noted  . Acute sinusitis with symptoms greater than 10 days 02/15/2016  . Vitamin D deficiency 11/06/2015  . BMI 60.0-69.9, adult ([place]) 11/05/2015  . Allergic rhinitis 11/05/2015  . 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 Diabetes mellitus ([place]) 04/23/2015  . Dysfunction of eustachian tube 11/06/2014  . H/O thyroidectomy 11/06/2014  . Hematuria, microscopic 11/06/2014  . Obstructive apnea 11/06/2014  . Leg pain 11/06/2014  . Papillary carcinoma, follicular variant ([place]) 05/31/2014  . PCOS (polycystic ovarian  syndrome) 07/25/2013  . Depression, postpartum 03/03/2013  . 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 Carrier of disease 08/21/2012  . Migraine 02/18/2010  . Clinical depression 01/16/2010  . Reflux esophagitis 01/16/2010  . Dyssomnia 03/10/2008    Past Surgical History:  Procedure Laterality Date  . ACHILLES TENDON SURGERY N/A 01/29/2016   Procedure: RIGHT ACHILLES  RECONSTRUCTION;  Surgeon: Wylene Simmer, MD;  Location: [place];  Service: Orthopedics;  Laterality: N/A;  . EXTRACORPOREAL SHOCK WAVE LITHOTRIPSY Left    left  . GASTROCNEMIUS RECESSION N/A 01/29/2016   Procedure: GASTROC RECESSION POSSIBLE FLEXIBLE FLEXOR HALGUS LONGUS TRANSER;  Surgeon: Wylene Simmer, MD;  Location: [place];  Service: Orthopedics;  Laterality: N/A;  . THYROID LOBECTOMY Right 05/16/2014   Procedure: RIGHT THYROID LOBECTOMY;  Surgeon: Armandina Gemma, MD;  Location: [facility];  Service: General;  Laterality: Right;  . THYROIDECTOMY N/A 06/01/2014   Procedure: COMPLETION THYROIDECTOMY;  Surgeon: Armandina Gemma, MD;  Location: [facility];  Service: General;  Laterality: N/A;  . TONSILECTOMY, ADENOIDECTOMY, BILATERAL MYRINGOTOMY AND TUBES     1984 and 1989  . TONSILLECTOMY      Prior to Admission medications   Medication Sig Start Date End Date Taking? Authorizing Provider  cetirizine (ZYRTEC) 10 MG tablet Take 10 mg by mouth daily.     Historical Provider, MD  Cholecalciferol (VITAMIN D3) 5000 units TABS Take 5,000 Units by mouth daily.    Historical Provider, MD  metFORMIN (GLUCOPHAGE-XR) 750 MG 24 hr tablet TAKE ONE TABLET BY MOUTH TWICE  A DAY 06/25/16   Arnetha Courser, MD  montelukast (SINGULAIR) 10 MG tablet Take 1 tablet (10 mg total) by mouth daily. 11/05/15   Adline Potter, MD  sertraline (ZOLOFT) 100 MG tablet Take 1 tablet (100 mg total) by mouth daily. 11/05/15   Adline Potter, MD  SYNTHROID 175 MCG tablet Take 1 tablet (175 mcg total) by mouth daily. 06/23/16   Philemon Kingdom, MD    Allergies Tioconazole and Tape  Family  History  Problem Relation Age of Onset  . Thyroid disease Mother     thyroid goiter  . Depression Mother   . Hyperlipidemia Mother   . Hypertension Mother   . Heart disease Mother     triple bypass  . Heart disease Father   . Hyperlipidemia Father   . Hypertension Father   . Sleep apnea Father   . Asthma Brother   . Depression Brother   . Heart disease Maternal Grandmother   . Hyperlipidemia Maternal Grandmother   . Heart disease Maternal Grandfather   . Hyperlipidemia Maternal Grandfather   . Heart disease Paternal Grandmother   . Hyperlipidemia Paternal Grandmother   . Heart disease Paternal Grandfather   . Hyperlipidemia Paternal Grandfather   . Diabetes Paternal Grandfather   . Sleep apnea Paternal Grandfather     Social History Social History  Substance Use Topics  . Smoking status: Former Smoker    Packs/day: 0.25    Years: 2.00    Types: Cigarettes    Quit date: 05/26/1998  . Smokeless tobacco: Never Used     Comment: "quit smoking in 2000 - smoked for 2 years"   . Alcohol use No    Review of Systems Constitutional: No fever/chills Eyes: No visual changes. ENT: No sore throat. Cardiovascular: Denies chest pain.Positive palpitations Respiratory: Denies shortness of breath. Gastrointestinal: No abdominal pain.  No nausea, no vomiting.  No diarrhea.  No constipation. Genitourinary: Negative for dysuria. Musculoskeletal: Negative for back pain. Skin: Negative for rash. Neurological: Negative for headaches, focal weakness or numbness.  10-point ROS otherwise negative.  ____________________________________________   PHYSICAL EXAM:  VITAL SIGNS: ED Triage Vitals  Enc Vitals Group     BP 07/15/16 0016 (!) 143/80     Pulse Rate 07/15/16 0016 83     Resp 07/15/16 0016 16     Temp 07/15/16 0016 98.2 F (36.8 C)     Temp Source 07/15/16 0016 Oral     SpO2 07/15/16 0016 100 %     Weight 07/15/16 0016 (!) 350 lb (158.8 kg)     Height 07/15/16 0016 5\' 3"   (1.6 m)     Head Circumference --      Peak Flow --      Pain Score 07/15/16 0017 4     Pain Loc --      Pain Edu? --      Excl. in [place]? --     Constitutional: Alert and oriented. Well appearing and in no acute distress. Eyes: Conjunctivae are normal. PERRL. EOMI. Head: Atraumatic. Mouth/Throat: Mucous membranes are moist. Oropharynx non-erythematous. Neck: No stridor.  No meningeal signs.  No cervical spine tenderness to palpation. Cardiovascular: Normal rate, regular rhythm. Good peripheral circulation. Grossly normal heart sounds. Multiple intermittent PVCs noted on monitor during evaluation. Respiratory: Normal respiratory effort.  No retractions. Lungs CTAB. Gastrointestinal: Soft and nontender. No distention.  Musculoskeletal: No lower extremity tenderness nor edema. No gross deformities of extremities. Neurologic:  Normal speech and language. No gross focal neurologic  deficits are appreciated.  Skin:  Skin is warm, dry and intact. No rash noted. Psychiatric: Mood and affect are normal. Speech and behavior are normal.  ____________________________________________   LABS (all labs ordered are listed, but only abnormal results are displayed)  Labs Reviewed  BASIC METABOLIC PANEL - Abnormal; Notable for the following:       Result Value   Glucose, Bld 116 (*)    Calcium 8.5 (*)    All other components within normal limits  CBC - Abnormal; Notable for the following:    RDW 15.1 (*)    All other components within normal limits  TSH - Abnormal; Notable for the following:    TSH 4.612 (*)    All other components within normal limits  TROPONIN I  MAGNESIUM   ____________________________________________  EKG  ED ECG REPORT I, Sheldon N Madlyn Crosby, the attending physician, personally viewed and interpreted this ECG.   Date: 07/15/2016  EKG Time: 12:25 AM  Rate: 76  Rhythm: Normal sinus rhythm  Axis: Normal  Intervals: Normal  ST&T Change:  None  ____________________________________________  RADIOLOGY I, Onset N Orazio Weller, personally viewed and evaluated these images (plain radiographs) as part of my medical decision making, as well as reviewing the written report by the radiologist.  Dg Chest 2 View  Result Date: 07/15/2016 CLINICAL DATA:  Palpitations EXAM: CHEST  2 VIEW COMPARISON:  05/11/2014 FINDINGS: The heart size and mediastinal contours are within normal limits. Both lungs are clear. The visualized skeletal structures are unremarkable. IMPRESSION: No active cardiopulmonary disease. Electronically Signed   By: Donavan Foil M.D.   On: 07/15/2016 00:44    ___________  Procedures     INITIAL IMPRESSION / ASSESSMENT AND PLAN / ED COURSE  Pertinent labs & imaging results that were available during my care of the patient were reviewed by me and considered in my medical decision making (see chart for details).  Patient had multiple episodes of PVCs during my evaluation. I spoke with the patient at length regarding this finding. In addition I informed the patient of her increasing TSH level and advised to follow-up with her oncologist.      ____________________________________________  FINAL CLINICAL IMPRESSION(S) / ED DIAGNOSES  Final diagnoses:  PVC (premature ventricular contraction)     MEDICATIONS GIVEN DURING THIS VISIT:  Medications - No data to display   NEW OUTPATIENT MEDICATIONS STARTED DURING THIS VISIT:  New Prescriptions   No medications on file    Modified Medications   No medications on file    Discontinued Medications   No medications on file     Note:  This document was prepared using Dragon voice recognition software and may include unintentional dictation errors.    Gregor Hams, MD 07/15/16 [phone]

## 2016-07-15 NOTE — ED Triage Notes (Signed)
Pt reports for the last few weeks she has been having palpitations - c/o chest pain that radiates into upper arms, head and shoulders (started today) - denies any cardiac history - thyroid removed two years ago - denies nausea/vomiting - c/o pressure in upper mid chest region - denies shortness of breath at this time

## 2016-07-29 ENCOUNTER — Other Ambulatory Visit: Payer: Self-pay

## 2016-07-29 NOTE — Telephone Encounter (Signed)
Pt states she is looking for a new provider.

## 2016-07-29 NOTE — Telephone Encounter (Signed)
See 05/23/16 note and 06/25/16 note I would really like to see the patient if she wishes to have [place] prescribe her metformin If she doesn't want to come here and have Dr. Renne Crigler prescribe it, that's fine Last Cr normal

## 2016-07-30 ENCOUNTER — Emergency Department: Payer: Managed Care, Other (non HMO)

## 2016-07-30 ENCOUNTER — Emergency Department
Admission: EM | Admit: 2016-07-30 | Discharge: 2016-07-30 | Disposition: A | Discharge status: Home / Self Care | Payer: Managed Care, Other (non HMO) | Attending: Emergency Medicine | Admitting: Emergency Medicine

## 2016-07-30 DIAGNOSIS — R0602 Shortness of breath: Secondary | ICD-10-CM | POA: Insufficient documentation

## 2016-07-30 DIAGNOSIS — Z87891 Personal history of nicotine dependence: Secondary | ICD-10-CM | POA: Insufficient documentation

## 2016-07-30 DIAGNOSIS — Z79899 Other long term (current) drug therapy: Secondary | ICD-10-CM | POA: Insufficient documentation

## 2016-07-30 DIAGNOSIS — R002 Palpitations: Secondary | ICD-10-CM | POA: Diagnosis not present

## 2016-07-30 DIAGNOSIS — Z85850 Personal history of malignant neoplasm of thyroid: Secondary | ICD-10-CM | POA: Diagnosis not present

## 2016-07-30 DIAGNOSIS — R000 Tachycardia, unspecified: Secondary | ICD-10-CM | POA: Diagnosis present

## 2016-07-30 DIAGNOSIS — Z7984 Long term (current) use of oral hypoglycemic drugs: Secondary | ICD-10-CM | POA: Insufficient documentation

## 2016-07-30 LAB — BASIC METABOLIC PANEL
Anion gap: 7 (ref 5–15)
BUN: 14 mg/dL (ref 6–20)
CALCIUM: 8.4 mg/dL — AB (ref 8.9–10.3)
CHLORIDE: 105 mmol/L (ref 101–111)
CO2: 26 mmol/L (ref 22–32)
CREATININE: 0.61 mg/dL (ref 0.44–1.00)
Glucose, Bld: 102 mg/dL — ABNORMAL HIGH (ref 65–99)
Potassium: 4 mmol/L (ref 3.5–5.1)
SODIUM: 138 mmol/L (ref 135–145)

## 2016-07-30 LAB — CBC
HCT: 38.5 % (ref 35.0–47.0)
Hemoglobin: 13 g/dL (ref 12.0–16.0)
MCH: 28.1 pg (ref 26.0–34.0)
MCHC: 33.8 g/dL (ref 32.0–36.0)
MCV: 83.2 fL (ref 80.0–100.0)
PLATELETS: 270 10*3/uL (ref 150–440)
RBC: 4.63 MIL/uL (ref 3.80–5.20)
RDW: 15.6 % — AB (ref 11.5–14.5)
WBC: 9 10*3/uL (ref 3.6–11.0)

## 2016-07-30 LAB — TROPONIN I

## 2016-07-30 MED ORDER — METOPROLOL TARTRATE 25 MG PO TABS: 25.0000 mg | ORAL_TABLET | Freq: Two times a day (BID) | ORAL | 0 refills | Status: DC

## 2016-07-30 MED ORDER — IOPAMIDOL (ISOVUE-370) INJECTION 76%
75.0000 mL | Freq: Once | INTRAVENOUS | Status: AC | PRN
  Administered 2016-07-30: 75 mL via INTRAVENOUS

## 2016-07-30 NOTE — ED Provider Notes (Signed)
[facility] Emergency Department Provider Note   ____________________________________________    I have reviewed the triage vital signs and the nursing notes.   HISTORY  Chief Complaint Palpitations and Shortness of Breath     HPI Linda Welch is a 36 y.o. female who presents with complaints of palpitations. Patient reports 3 weeks ago she was seen in the emergency department and diagnosed with PVCs. She was told to return if her symptoms worsened. Today she has felt that she was having PVCs every 10 seconds or so. She reports it made her face flushed. She denies shortness of breath. No nausea or vomiting. No dizziness or syncope. She is quite upset at having to wait in the waiting room. She has a cardiologist appointment in 1.5 weeks. She reports she started having PVCs partially 2 months ago, reports significant lower extremity surgery 6 months ago and no recent travel   Past Medical History:  Diagnosis Date  . Allergy   . Anxiety   . Cancer [facility])    "thyroid cancer - thyroidectomy January 2016"  . Complication of anesthesia    "post operative myalgia"  . Depression   . Difficult intubation    "due to limited neck mobility"  . GERD (gastroesophageal reflux disease)    "not taking Prilosec any longer"  . Hashimoto's thyroiditis 2010  . Headache    migraines- usually x1 weekly  . History of bronchitis   . History of kidney stones    lithotripsy  . History of pneumonia   . PCOS (polycystic ovarian syndrome) 2003   takes Metformin for PCOS  . Sleep apnea, obstructive    cpap nightly-settings 16  . Stress incontinence   . Wears glasses     Patient Active Problem List   Diagnosis Date Noted  . Acute sinusitis with symptoms greater than 10 days 02/15/2016  . Vitamin D deficiency 11/06/2015  . BMI 60.0-69.9, adult ([place]) 11/05/2015  . Allergic rhinitis 11/05/2015  . 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 Diabetes mellitus ([place])  04/23/2015  . Dysfunction of eustachian tube 11/06/2014  . H/O thyroidectomy 11/06/2014  . Hematuria, microscopic 11/06/2014  . Obstructive apnea 11/06/2014  . Leg pain 11/06/2014  . 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 PCOS (polycystic ovarian syndrome) 07/25/2013  . Depression, postpartum 03/03/2013  . 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 Carrier of disease 08/21/2012  . Migraine 02/18/2010  . Clinical depression 01/16/2010  . Reflux esophagitis 01/16/2010  . Dyssomnia 03/10/2008    Past Surgical History:  Procedure Laterality Date  . ACHILLES TENDON SURGERY N/A 01/29/2016   Procedure: RIGHT ACHILLES  RECONSTRUCTION;  Surgeon: Wylene Simmer, MD;  Location: [place];  Service: Orthopedics;  Laterality: N/A;  . EXTRACORPOREAL SHOCK WAVE LITHOTRIPSY Left    left  . GASTROCNEMIUS RECESSION N/A 01/29/2016   Procedure: GASTROC RECESSION POSSIBLE FLEXIBLE FLEXOR HALGUS LONGUS TRANSER;  Surgeon: Wylene Simmer, MD;  Location: [place];  Service: Orthopedics;  Laterality: N/A;  . THYROID LOBECTOMY Right 05/16/2014   Procedure: RIGHT THYROID LOBECTOMY;  Surgeon: Armandina Gemma, MD;  Location: [facility];  Service: General;  Laterality: Right;  . THYROIDECTOMY N/A 06/01/2014   Procedure: COMPLETION THYROIDECTOMY;  Surgeon: Armandina Gemma, MD;  Location: [facility];  Service: General;  Laterality: N/A;  . TONSILECTOMY, ADENOIDECTOMY, BILATERAL MYRINGOTOMY AND TUBES     1984 and 1989  . TONSILLECTOMY      Prior to Admission medications   Medication Sig Start Date End Date Taking? Authorizing Provider  cetirizine (ZYRTEC) 10 MG tablet Take 10 mg by mouth daily.     Historical Provider, MD  Cholecalciferol (VITAMIN D3) 5000 units TABS Take 5,000 Units by mouth daily.    Historical Provider, MD  metFORMIN (GLUCOPHAGE-XR) 750 MG 24 hr tablet TAKE ONE TABLET BY MOUTH TWICE A DAY 06/25/16   Arnetha Courser, MD  metoprolol tartrate (LOPRESSOR) 25 MG tablet Take 1 tablet (25 mg total) by mouth 2 (two) times  daily. 07/30/16 08/29/16  Lavonia Drafts, MD  montelukast (SINGULAIR) 10 MG tablet Take 1 tablet (10 mg total) by mouth daily. 11/05/15   Adline Potter, MD  sertraline (ZOLOFT) 100 MG tablet Take 1 tablet (100 mg total) by mouth daily. 11/05/15   Adline Potter, MD  SYNTHROID 175 MCG tablet Take 1 tablet (175 mcg total) by mouth daily. 06/23/16   Philemon Kingdom, MD     Allergies Tioconazole and Tape  Family History  Problem Relation Age of Onset  . Thyroid disease Mother     thyroid goiter  . Depression Mother   . Hyperlipidemia Mother   . Hypertension Mother   . Heart disease Mother     triple bypass  . Heart disease Father   . Hyperlipidemia Father   . Hypertension Father   . Sleep apnea Father   . Asthma Brother   . Depression Brother   . Heart disease Maternal Grandmother   . Hyperlipidemia Maternal Grandmother   . Heart disease Maternal Grandfather   . Hyperlipidemia Maternal Grandfather   . Heart disease Paternal Grandmother   . Hyperlipidemia Paternal Grandmother   . Heart disease Paternal Grandfather   . Hyperlipidemia Paternal Grandfather   . Diabetes Paternal Grandfather   . Sleep apnea Paternal Grandfather     Social History Social History  Substance Use Topics  . Smoking status: Former Smoker    Packs/day: 0.25    Years: 2.00    Types: Cigarettes    Quit date: 05/26/1998  . Smokeless tobacco: Never Used     Comment: "quit smoking in 2000 - smoked for 2 years"   . Alcohol use No    Review of Systems  Constitutional: No fever/chills Eyes: No visual changes.   Cardiovascular: Palpitations as above, now resolved Respiratory: Denies shortness of breath.  Musculoskeletal: Negative for calf pain Skin: Negative for rash. Neurological: Negative for headaches   10-point ROS otherwise negative.  ____________________________________________   PHYSICAL EXAM:  VITAL SIGNS: ED Triage Vitals  Enc Vitals Group     BP 07/30/16 1018 139/78     Pulse Rate  07/30/16 1018 (!) 107     Resp 07/30/16 1018 18     Temp 07/30/16 1018 98.4 F (36.9 C)     Temp Source 07/30/16 1018 Oral     SpO2 07/30/16 1018 98 %     Weight 07/30/16 1018 (!) 350 lb (158.8 kg)     Height 07/30/16 1018 5\' 3"  (1.6 m)     Head Circumference --      Peak Flow --      Pain Score 07/30/16 1022 3     Pain Loc --      Pain Edu? --      Excl. in [place]? --    Constitutional: Alert and oriented. No acute distress. Angry and upset Eyes: Conjunctivae are normal.  Head: Atraumatic. Nose: No congestion/rhinnorhea. Mouth/Throat: Mucous membranes are moist.    Cardiovascular: Normal rate, regular rhythm. Grossly normal heart sounds.  Good peripheral circulation. Respiratory: Normal  respiratory effort.  No retractions. Lungs CTAB. Gastrointestinal: Soft and nontender. No distention.  No CVA tenderness. Genitourinary: deferred Musculoskeletal: No lower extremity tenderness nor edema.  Warm and well perfused Neurologic:  Normal speech and language. No gross focal neurologic deficits are appreciated.  Skin:  Skin is warm, dry and intact. No rash noted. Psychiatric: Mood and affect are normal. Speech and behavior are normal.  ____________________________________________   LABS (all labs ordered are listed, but only abnormal results are displayed)  Labs Reviewed  BASIC METABOLIC PANEL - Abnormal; Notable for the following:       Result Value   Glucose, Bld 102 (*)    Calcium 8.4 (*)    All other components within normal limits  CBC - Abnormal; Notable for the following:    RDW 15.6 (*)    All other components within normal limits  TROPONIN I   ____________________________________________  EKG  ED ECG REPORT I, Lavonia Drafts, the attending physician, personally viewed and interpreted this ECG.  Date: 07/30/2016 EKG Time: 10:12 AM Rate: 107 Rhythm: Sinus tachycardia QRS Axis: normal Intervals: normal ST/T Wave abnormalities: normal Conduction Disturbances:  none Narrative Interpretation: unremarkable  ____________________________________________  RADIOLOGY  CTA chest negative for PE ____________________________________________   PROCEDURES  Procedure(s) performed: No    Critical Care performed:No ____________________________________________   INITIAL IMPRESSION / ASSESSMENT AND PLAN / ED COURSE  Pertinent labs & imaging results that were available during my care of the patient were reviewed by me and considered in my medical decision making (see chart for details).  Patient well-appearing in no acute distress. She is mildly tachycardic on arrival, likely related to being upset and angry at having to wait. However there is a possibility of an undiagnosed PE causing her tachycardia and symptoms. We will obtain CT angio and reevaluate. It does appear that her PVCs have resolved currently  ----------------------------------------- 2:43 PM on 07/30/2016 -----------------------------------------  CT scan is unremarkable. Discussed with Dr. Ubaldo Glassing who will see the patient in clinic tomorrow. He recommends 25 mg metoprolol BID      ____________________________________________   FINAL CLINICAL IMPRESSION(S) / ED DIAGNOSES  Final diagnoses:  Palpitations      NEW MEDICATIONS STARTED DURING THIS VISIT:  New Prescriptions   METOPROLOL TARTRATE (LOPRESSOR) 25 MG TABLET    Take 1 tablet (25 mg total) by mouth 2 (two) times daily.     Note:  This document was prepared using Dragon voice recognition software and may include unintentional dictation errors.    Lavonia Drafts, MD 07/30/16 [phone]

## 2016-07-30 NOTE — ED Notes (Signed)
AAOx3.  Skin warm and dry.  No SOB/ DOE.  NAD 

## 2016-07-30 NOTE — ED Triage Notes (Signed)
Pt c/o having frequency "PVC's" states she was seen here last week for the same but states today she is having SOB with it.

## 2016-08-06 ENCOUNTER — Other Ambulatory Visit: Payer: Self-pay

## 2016-08-06 MED ORDER — METFORMIN HCL ER 750 MG PO TB24: 750.0000 mg | ORAL_TABLET | Freq: Two times a day (BID) | ORAL | 2 refills | Status: DC

## 2016-08-06 NOTE — Telephone Encounter (Signed)
I spoke with patient; she is going to see a new doctor; appt is in June I sent refills to last through then She says nothing wrong in particular with this practice, she just prefers a female doctor

## 2016-08-11 ENCOUNTER — Ambulatory Visit: Payer: Managed Care, Other (non HMO) | Admitting: Physician Assistant

## 2016-12-05 ENCOUNTER — Other Ambulatory Visit: Payer: Self-pay

## 2016-12-09 ENCOUNTER — Ambulatory Visit (INDEPENDENT_AMBULATORY_CARE_PROVIDER_SITE_OTHER): Payer: Managed Care, Other (non HMO) | Admitting: Family Medicine

## 2016-12-09 ENCOUNTER — Encounter: Payer: Self-pay | Admitting: Family Medicine

## 2016-12-09 DIAGNOSIS — G4733 Obstructive sleep apnea (adult) (pediatric): Secondary | ICD-10-CM | POA: Diagnosis not present

## 2016-12-09 DIAGNOSIS — Z6841 Body Mass Index (BMI) 40.0 and over, adult: Secondary | ICD-10-CM | POA: Diagnosis not present

## 2016-12-09 DIAGNOSIS — E282 Polycystic ovarian syndrome: Secondary | ICD-10-CM

## 2016-12-09 DIAGNOSIS — J309 Allergic rhinitis, unspecified: Secondary | ICD-10-CM | POA: Diagnosis not present

## 2016-12-09 DIAGNOSIS — E890 Postprocedural hypothyroidism: Secondary | ICD-10-CM

## 2016-12-09 DIAGNOSIS — Z9989 Dependence on other enabling machines and devices: Secondary | ICD-10-CM | POA: Diagnosis not present

## 2016-12-09 DIAGNOSIS — F331 Major depressive disorder, recurrent, moderate: Secondary | ICD-10-CM | POA: Diagnosis not present

## 2016-12-09 MED ORDER — AZELASTINE HCL 0.1 % NA SOLN: 1.0000 | Freq: Two times a day (BID) | NASAL | 6 refills | Status: DC

## 2016-12-09 MED ORDER — SERTRALINE HCL 50 MG PO TABS: 50.0000 mg | ORAL_TABLET | Freq: Every day | ORAL | 11 refills | Status: DC

## 2016-12-09 NOTE — Assessment & Plan Note (Signed)
On CPAP therapy  

## 2016-12-09 NOTE — Progress Notes (Signed)
BP 126/72   Pulse 82   Temp 98.5 F (36.9 C) (Oral)   Ht 5\' 2"  (1.575 m)   Wt (!) 361 lb (163.7 kg)   LMP 11/05/2016   SpO2 97%   BMI 66.03 kg/m    CC: new pt to establish care Subjective:    Patient ID: Linda Welch, female    DOB: May 29, 1980, 36 y.o.   MRN: [id number]  HPI: Linda Welch is a 36 y.o. female presenting on 12/09/2016 for Establish Care   Prior saw Dr Starleen Blue - who just retired 04/2016.  2 children at home (2006 and 2014) - oldest with asperger syndrome.  Divorced.   H/o recurrent sinus infections and sinus congestion/allergic rhinitis. Over last 3 months noticing increasing sinus pressure, congestion, PNdrip. This is despite singulair and zyrtec. Nasal steroids have caused nose bleeds. Has had allergy testing, allergic to mold.   Postpartum depression - started on zoloft 4 yrs ago. She did see post partum psychologist. Ran out of zoloft 100mg  for the past week. Interested in possible taper off.   PCOS - on metformin for menstrual cycles. This has helped regularize cycles (Q45d). H/o gestational diabetes.  Lab Results  Component Value Date   HGBA1C 5.8 08/13/2015    Hypothyroidism after hashimoto thyroiditis - on synthroid 114mcg - palpitations (PAC/PVCs) since increased dose. 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   Chronic ear pain - L>R due to chronic ear infections as child.   Preventative: GYN - Dr Kenton Kingfisher at [facility].  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 Interim medical history since our last visit reviewed. Allergies and medications reviewed and updated. Outpatient Medications Prior to Visit  Medication Sig Dispense Refill  . cetirizine (ZYRTEC) 10 MG tablet Take 10 mg by mouth daily.      . Cholecalciferol (VITAMIN D3) 5000 units TABS Take 5,000 Units by mouth daily.    . metFORMIN (GLUCOPHAGE-XR) 750 MG 24 hr tablet Take 1 tablet (750 mg total) by mouth 2 (two) times daily. 60 tablet 2  . montelukast (SINGULAIR) 10 MG tablet Take 1 tablet (10 mg total) by mouth daily. 90 tablet 3  . SYNTHROID 175 MCG tablet Take 1 tablet (175 mcg total) by mouth daily. 90 tablet 3  . metoprolol tartrate (LOPRESSOR) 25 MG tablet Take 1 tablet (25 mg total) by mouth 2 (two) times daily. (Patient taking differently: Take 50 mg by mouth 2 (two) times daily. ) 60 tablet 0  . sertraline (ZOLOFT) 100 MG tablet Take 1 tablet (100 mg total) by mouth daily. (Patient not taking: Reported on 12/09/2016) 90 tablet 3   No facility-administered medications prior to visit.      Per HPI unless specifically indicated in ROS section below Review of Systems     Objective:    BP 126/72   Pulse 82   Temp 98.5 F (36.9 C) (Oral)   Ht 5\' 2"  (1.575 m)   Wt (!) 361 lb (163.7 kg)   LMP 11/05/2016   SpO2 97%   BMI 66.03 kg/m   Wt Readings from Last 3 Encounters:  12/09/16 (!) 361 lb (163.7 kg)  07/30/16 (!) 350 lb (158.8 kg)  07/15/16 (!) 350 lb (158.8 kg)    Physical Exam  Constitutional: She appears  well-developed and well-nourished. No distress.  HENT:  Head: Normocephalic and atraumatic.  Mouth/Throat: Oropharynx is clear and moist. No oropharyngeal exudate.  Eyes: Pupils are equal, round, and reactive to light. Conjunctivae and EOM are normal.  Neck: Normal range of motion. Neck supple. No thyromegaly present.  Cardiovascular: Normal rate, regular rhythm, normal heart sounds and intact distal pulses.   No murmur heard. Pulmonary/Chest: Effort normal and breath sounds normal. No respiratory distress. She has no wheezes. She has no rales.  Musculoskeletal: She exhibits no edema.  Skin: Skin is warm and dry. No rash noted.  Psychiatric: She has a normal mood and affect.  Nursing note and vitals  reviewed.  Results for orders placed or performed during the hospital encounter of 02/77/41  Basic metabolic panel  Result Value Ref Range   Sodium 138 135 - 145 mmol/L   Potassium 4.0 3.5 - 5.1 mmol/L   Chloride 105 101 - 111 mmol/L   CO2 26 22 - 32 mmol/L   Glucose, Bld 102 (H) 65 - 99 mg/dL   BUN 14 6 - 20 mg/dL   Creatinine, Ser 0.61 0.44 - 1.00 mg/dL   Calcium 8.4 (L) 8.9 - 10.3 mg/dL   GFR calc non Af Amer >60 >60 mL/min   GFR calc Af Amer >60 >60 mL/min   Anion gap 7 5 - 15  CBC  Result Value Ref Range   WBC 9.0 3.6 - 11.0 K/uL   RBC 4.63 3.80 - 5.20 MIL/uL   Hemoglobin 13.0 12.0 - 16.0 g/dL   HCT 38.5 35.0 - 47.0 %   MCV 83.2 80.0 - 100.0 fL   MCH 28.1 26.0 - 34.0 pg   MCHC 33.8 32.0 - 36.0 g/dL   RDW 15.6 (H) 11.5 - 14.5 %   Platelets 270 150 - 440 K/uL  Troponin I  Result Value Ref Range   Troponin I <0.03 <0.03 ng/mL      Assessment & Plan:  I asked patient to return for CPE.  Problem List Items Addressed This Visit    BMI 60.0-69.9, adult ([place])    Discussed healthy diet and lifestyle changes to affect sustainable weight loss. Reviewed building on small sustainable healthy changes at a time. Recommend she start with increased water intake.  Will reassess at next visit.       Chronic allergic rhinitis    Chronic issue, despite daily antihistamine and singulair. Recommend switch in oral antihistamine. Will also trial astelin nasal spray. Pt states intranasal steroid leads to epistaxis. Reviewed allergen avoidance measures. 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 Treated with zoloft long term. Interested in taper off med. Out of med x 1 wk, some discontinuation sxs (agitation, tremors). Will start 50mg  dose and pt will update me with effect, and continue attempts at  tapering.       Relevant Medications   sertraline (ZOLOFT) 50 MG tablet   OSA on CPAP    On CPAP therapy      PCOS (polycystic ovarian syndrome)    Followed by endo.      Postsurgical hypothyroidism    Appreciate endo care - regularly sees Dr Cruzita Lederer.           Follow up plan: Return in about 3 months (around 03/11/2017) for annual exam, prior fasting  for blood work.  Ria Bush, MD

## 2016-12-09 NOTE — Patient Instructions (Addendum)
Good to meet you today! Switch oral antihistamine to see if more effective. Continue singulair. Trial astelin nasal spray. Continue nasal saline throughout the day. Work on allergen avoidance measures as well.  Work on increased water intake.  Return at your convenience for physical, prior fasting for labs.

## 2016-12-09 NOTE — Assessment & Plan Note (Signed)
Appreciate endo care - regularly sees Dr Cruzita Lederer.

## 2016-12-09 NOTE — Assessment & Plan Note (Signed)
Initially dx with postpartum depression, recently doing well. Treated with zoloft long term. Interested in taper off med. Out of med x 1 wk, some discontinuation sxs (agitation, tremors). Will start 50mg  dose and pt will update me with effect, and continue attempts at tapering.

## 2016-12-09 NOTE — Assessment & Plan Note (Signed)
Chronic issue, despite daily antihistamine and singulair. Recommend switch in oral antihistamine. Will also trial astelin nasal spray. Pt states intranasal steroid leads to epistaxis. Reviewed allergen avoidance measures. 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

## 2016-12-09 NOTE — Assessment & Plan Note (Addendum)
Discussed healthy diet and lifestyle changes to affect sustainable weight loss. Reviewed building on small sustainable healthy changes at a time. Recommend she start with increased water intake.  Will reassess at next visit.

## 2016-12-09 NOTE — Assessment & Plan Note (Signed)
Followed by endo.  

## 2016-12-22 ENCOUNTER — Encounter: Payer: Self-pay | Admitting: Internal Medicine

## 2016-12-22 ENCOUNTER — Ambulatory Visit (INDEPENDENT_AMBULATORY_CARE_PROVIDER_SITE_OTHER): Payer: Managed Care, Other (non HMO) | Admitting: Internal Medicine

## 2016-12-22 VITALS — BP 124/84 | HR 82 | Wt 361.0 lb

## 2016-12-22 DIAGNOSIS — C73 Malignant neoplasm of thyroid gland: Secondary | ICD-10-CM

## 2016-12-22 DIAGNOSIS — Z9009 Acquired absence of other part of head and neck: Secondary | ICD-10-CM

## 2016-12-22 DIAGNOSIS — E890 Postprocedural hypothyroidism: Secondary | ICD-10-CM

## 2016-12-22 DIAGNOSIS — E559 Vitamin D deficiency, unspecified: Secondary | ICD-10-CM

## 2016-12-22 LAB — VITAMIN D 25 HYDROXY (VIT D DEFICIENCY, FRACTURES): VITD: 34.67 ng/mL (ref 30.00–100.00)

## 2016-12-22 LAB — TSH: TSH: 2.99 u[IU]/mL (ref 0.35–4.50)

## 2016-12-22 LAB — T4, FREE: Free T4: 0.94 ng/dL (ref 0.60–1.60)

## 2016-12-22 NOTE — Progress Notes (Signed)
Patient ID: Linda Welch, female   DOB: Dec 24, 1980, 36 y.o.   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  Last visit 6 mo ago.  She is having palpitations >> started fall 2017 after we last increased her LT4, but exacerbated 06/2016. She had full cardiac investigation including a Holter monitor >> PVC and PACs (217 events per 48h). She has no coffee, chocolate, sugar. On Metoprolol 2x  A day.  Reviewed hx of her Follicular variant of papillary thyroid cancer: - dx in in 02/2009 >> 3 cm nodule felt by pt >> seen by PCP >> referred to endo >> uptake and scan >> cold nodule >> FNA by Dr Linda Welch: benign (bad experience).  - thyroid U/S 2010: 3.1 x 2.6 x 2.2 cm complex, slightly hypoechoic, nodule, situated in the mid pole of the right thyroid lobe - Thyroid ultrasound 02/06/2014: Large nodule occupying most of the right lobe measures 5.2 x 3.2 x 4.1 cm. There are some internal calcifications. It previously measured 3.1 x 2.6 x 2.2 cm based on the prior report. - FNA 61/60/7371: FOLLICULAR NEOPLASM AND/OR LESION (BETHESDA IV)  - Right lobectomy 05/16/2014 and completion thyroidectomy 06/01/2014 Thyroid, lobectomy, right - FOLLICULAR VARIANT OF PAPILLARY THYROID CARCINOMA WITH CAPSULAR INVASION, 4.2 CM, CONFINED WITHIN THYROIDAL TISSUE. - NO EVIDENCE OF ANGIOLYMPHATIC INVASION IDENTIFIED. - RESECTION MARGINS, NEGATIVE FOR ATYPIA OR MALIGNANCY. Microscopic Comment THYROID Specimen: Right thyroid Procedure: Right hemithyroidectomy Specimen Integrity (intact/fragmented): Intact Tumor focality: Unifocal Maximum tumor size (cm): 4.2 cm, gross description Tumor laterality: Right thyroid gland Histologic type (including subtype and/or unique features as applicable): Follicular variant of papillary thyroid carcinoma Tumor capsule: Present Extrathyroidal extension: No Margins:  Negative Lymph - Vascular invasion: Not identified Capsular invasion with degree of invasion if present: Present, complete Lymph nodes: # examined N/A; # positive; N/A TNM code: pT3 , pNX Non-neoplastic thyroid: Unremarkable. Due to the low risk of her thyroid tumor, I did not recommend I-131 remnant ablation. Component     Latest Ref Rng & Units 12/24/2015  Thyroglobulin     2.8 - 40.9 ng/mL 0.6 (L)  Thyroglobulin Ab     <2 IU/mL <1  01/07/2016: Neck ultrasound: Surgical changes of total thyroidectomy without evidence of residual thyroid tissue, nodularity or lymphadenopathy.  Hypothyroidism: Pt. has been dx with hypothyroidism in 2010, now postoperative hypothyroidism; is on Synthroid 88 >> 100 >> 150 (since 09/2015) >> 175 (since 12/24/2015) mcg, taken: - in am - fasting - at least 30 min from b'fast - + Ca, PPIs at night - no Fe, MVI,  - not on Biotin  I reviewed pt's thyroid tests: Lab Results  Component Value Date   TSH 4.612 (H) 07/15/2016   TSH 1.40 06/23/2016   TSH 7.61 (H) 12/24/2015   TSH 8.12 (H) 10/08/2015   TSH 14.04 (H) 08/13/2015   FREET4 0.91 06/23/2016   FREET4 0.94 12/24/2015   FREET4 0.84 10/08/2015   FREET4 0.72 08/13/2015   FREET4 0.68 06/25/2015  03/29/2013: TSH 0.93, fT4 1.03 02/04/2013 (towards end of pregnancy): TSH 0.12, fT4 0.96 >> Synthroid reduced from 100 to 88 mcg 12/2012: TSH 0.07 >> Synthroid reduced from 112 to 100 mcg  Pt denies: - feeling nodules in neck - hoarseness - dysphagia - choking - SOB with lying down  She was diagnosed with vitamin D insufficiency (9) >> started vitamin D 5000 units daily in 10/2015. At last visit,  I advised her to increase to 8000 units daily. She forgot to do this....  Lab Results  Component Value Date   VD25OH 23.51 (L) 06/23/2016   VD25OH 9.6 (L) 11/05/2015   ROS: Constitutional: no weight gain/no weight loss, no fatigue, no subjective hyperthermia, no subjective hypothermia Eyes: no blurry  vision, no xerophthalmia ENT: no sore throat, no nodules palpated in throat, no dysphagia, no odynophagia, no hoarseness Cardiovascular: no CP/no SOB/+ palpitations/no leg swelling Respiratory: no cough/no SOB/no wheezing Gastrointestinal: no N/no V/no D/no C/no acid reflux Musculoskeletal: no muscle aches/no joint aches Skin: no rashes, no hair loss Neurological: no tremors/no numbness/no tingling/no dizziness  I reviewed pt's medications, allergies, PMH, social hx, family hx, and changes were documented in the history of present illness. Otherwise, unchanged from my initial visit note.  PE: BP 124/84 (BP Location: Left Arm, Patient Position: Sitting)   Pulse 82   Wt (!) 361 lb (163.7 kg)   LMP 11/05/2016   SpO2 97%   BMI 66.03 kg/m  Wt Readings from Last 3 Encounters:  12/22/16 (!) 361 lb (163.7 kg)  12/09/16 (!) 361 lb (163.7 kg)  07/30/16 (!) 350 lb (158.8 kg)   Constitutional: overweight, in NAD Eyes: PERRLA, EOMI, no exophthalmos ENT: moist mucous membranes, thyroidectomy scar healed, no neck masses, no cervical lymphadenopathy Cardiovascular: RRR, No MRG Respiratory: CTA B Gastrointestinal: abdomen soft, NT, ND, BS+ Musculoskeletal: no deformities, strength intact in all 4 Skin: moist, warm, no rashes Neurological: no tremor with outstretched hands, DTR normal in all 4  ASSESSMENT: 1. Follicular variant of PTC  2. Postsurgical hypothyroidism  3. Vitamin D deficiency  PLAN:  1. PTC - Patient with follicular variant of Papillary thyroid cancer thyroid cancer which is the least aggressive type cancer. This was encapsulated and it did not spread outside the thyroid. She is stage I TNM and her prognosis is excellent.  - she feels no masses in her neck  - latest thyroid   U/S  Report from 11/2015 was reviewed >> no recurrence or metastasis - We also reviewed her Tg + ATA from 11/2015. The thyroglobulin is low, but not undetectable, as expected it since she did not have  RAI treatment. We again discussed that the trend in thyroglobulin and not absolute value is the most - we will recheck these labs today  2. Hypothyroidism - latest thyroid labs reviewed with pt >> TSH was slightly high  - she continues on LT4 DAW 175 mcg daily >> had palpitations after increasing the dose 1 year ago. Investigation showed PVCs and PACs. The TSH obtained at that time was a little high, though, not low. We continues the same dose. - pt feels good on this dose. - we discussed about taking the thyroid hormone every day, with water, >30 minutes before breakfast, separated by >4 hours from acid reflux medications, calcium, iron, multivitamins. Pt. is taking it correctly - will check thyroid tests today: TSH and fT4 - If labs are abnormal, she will need to return for repeat TFTs in 1.5 months - OTW, RTC in 6 mo  3. Vitamin D deficiency - Patient has a very low vitamin D for which is over-the-counter vitamin D 5000 units daily. She forgot to increase to 8000 IU daily. - will recheck level today.   Needs refills. We may need to decrease the dose if TSH allows due to her palpitations. Component     Latest Ref Rng & Units 12/22/2016  TSH     0.35 -  4.50 uIU/mL 2.99  T4,Free(Direct)     0.60 - 1.60 ng/dL 0.94  Thyroglobulin     ng/mL 0.3 (L)  Thyroglobulin Ab     <2 IU/mL <1  VITD     30.00 - 100.00 ng/mL 34.67   Tests are normal. Thyroglobulin has improved. TSH is not too low, so at this point I would not recommend to decrease the dose of levothyroxine. Since vitamin D is normal, we can continue with 5000 units vitamin D daily.  Philemon Kingdom, MD PhD [facility] Endocrinology

## 2016-12-22 NOTE — Patient Instructions (Signed)
Please stop at the lab.  Please continue Synthroid 175 mcg daily.  .Take the thyroid hormone every day, with water, at least 30 minutes before breakfast, separated by at least 4 hours from: - acid reflux medications - calcium - iron - multivitamins  For now, continue 5000 units daily.  Please come back for a follow-up appointment in 6 months.

## 2016-12-23 LAB — THYROGLOBULIN LEVEL: THYROGLOBULIN: 0.3 ng/mL — AB

## 2016-12-23 LAB — THYROGLOBULIN ANTIBODY

## 2016-12-23 MED ORDER — SYNTHROID 175 MCG PO TABS: 175.0000 ug | ORAL_TABLET | Freq: Every day | ORAL | 3 refills | Status: DC

## 2017-01-01 ENCOUNTER — Other Ambulatory Visit: Payer: Self-pay | Admitting: Family Medicine

## 2017-01-01 ENCOUNTER — Other Ambulatory Visit (INDEPENDENT_AMBULATORY_CARE_PROVIDER_SITE_OTHER): Payer: Managed Care, Other (non HMO)

## 2017-01-01 DIAGNOSIS — Z148 Genetic carrier of other disease: Secondary | ICD-10-CM

## 2017-01-01 DIAGNOSIS — Z6841 Body Mass Index (BMI) 40.0 and over, adult: Secondary | ICD-10-CM | POA: Diagnosis not present

## 2017-01-01 LAB — COMPREHENSIVE METABOLIC PANEL
ALK PHOS: 77 U/L (ref 39–117)
ALT: 13 U/L (ref 0–35)
AST: 14 U/L (ref 0–37)
Albumin: 4 g/dL (ref 3.5–5.2)
BILIRUBIN TOTAL: 0.3 mg/dL (ref 0.2–1.2)
BUN: 10 mg/dL (ref 6–23)
CO2: 29 mEq/L (ref 19–32)
Calcium: 8.6 mg/dL (ref 8.4–10.5)
Chloride: 103 mEq/L (ref 96–112)
Creatinine, Ser: 0.71 mg/dL (ref 0.40–1.20)
GFR: 97.86 mL/min (ref >60.00)
GLUCOSE: 101 mg/dL — AB (ref 70–99)
Potassium: 4.1 mEq/L (ref 3.5–5.1)
Sodium: 138 mEq/L (ref 135–145)
TOTAL PROTEIN: 7.2 g/dL (ref 6.0–8.3)

## 2017-01-01 LAB — LIPID PANEL
Cholesterol: 235 mg/dL — ABNORMAL HIGH (ref 0–200)
HDL: 40.5 mg/dL (ref >39.00)
LDL Cholesterol: 159 mg/dL — ABNORMAL HIGH (ref 0–99)
NONHDL: 194.11
Total CHOL/HDL Ratio: 6
Triglycerides: 178 mg/dL — ABNORMAL HIGH (ref 0.0–149.0)
VLDL: 35.6 mg/dL (ref 0.0–40.0)

## 2017-01-06 ENCOUNTER — Encounter: Payer: Self-pay | Admitting: Family Medicine

## 2017-01-06 ENCOUNTER — Ambulatory Visit (INDEPENDENT_AMBULATORY_CARE_PROVIDER_SITE_OTHER): Payer: Managed Care, Other (non HMO) | Admitting: Family Medicine

## 2017-01-06 VITALS — BP 130/66 | HR 75 | Temp 98.1°F | Ht 62.25 in | Wt 359.0 lb

## 2017-01-06 DIAGNOSIS — Z0000 Encounter for general adult medical examination without abnormal findings: Secondary | ICD-10-CM | POA: Diagnosis not present

## 2017-01-06 DIAGNOSIS — E282 Polycystic ovarian syndrome: Secondary | ICD-10-CM

## 2017-01-06 DIAGNOSIS — E559 Vitamin D deficiency, unspecified: Secondary | ICD-10-CM | POA: Diagnosis not present

## 2017-01-06 DIAGNOSIS — Z6841 Body Mass Index (BMI) 40.0 and over, adult: Secondary | ICD-10-CM | POA: Diagnosis not present

## 2017-01-06 DIAGNOSIS — E785 Hyperlipidemia, unspecified: Secondary | ICD-10-CM | POA: Diagnosis not present

## 2017-01-06 DIAGNOSIS — F331 Major depressive disorder, recurrent, moderate: Secondary | ICD-10-CM | POA: Diagnosis not present

## 2017-01-06 DIAGNOSIS — E890 Postprocedural hypothyroidism: Secondary | ICD-10-CM | POA: Diagnosis not present

## 2017-01-06 DIAGNOSIS — Z0001 Encounter for general adult medical examination with abnormal findings: Secondary | ICD-10-CM | POA: Insufficient documentation

## 2017-01-06 NOTE — Progress Notes (Signed)
BP 130/66   Pulse 75   Temp 98.1 F (36.7 C) (Oral)   Ht 5' 2.25" (1.581 m)   Wt (!) 359 lb (162.8 kg)   LMP 12/31/2016   SpO2 97%   BMI 65.14 kg/m    CC: CPE Subjective:    Patient ID: Linda Welch, female    DOB: Jun 03, 1980, 36 y.o.   MRN: [id number]  HPI: Linda Welch is a 36 y.o. female presenting on 01/06/2017 for Annual Exam   Postsurgical hypothyroidism - sees Dr Cruzita Lederer Pt states she takes metformin 750mg  once daily.  Just completed 23 and me - 2 genetic variants for celiac disease.   Preventative: GYN - [facility] Kenton Kingfisher at [facility]. Due for this. All normal pap smears.  Flu shot yearly Tdap 2013, 2014 Prevnar 2015 Seat belt use discussed Sunscreen use discussed. fmhx skin cancer.  Ex smoker - quit 2000 Alcohol - none  Divorced  Lives with 2nd husband and 2 boys (2006, 2014) Occ: Psychiatric nurse for [employer] Edu: some college Activity: no regular exercise outside of work  Diet: good water, vegetables daily   Relevant past medical, surgical, family and social history reviewed and updated as indicated. Interim medical history since our last visit reviewed. Allergies and medications reviewed and updated. Outpatient Medications Prior to Visit  Medication Sig Dispense Refill  . azelastine (ASTELIN) 0.1 % nasal spray Place 1 spray into both nostrils 2 (two) times daily. Use in each nostril as directed 30 mL 6  . cetirizine (ZYRTEC) 10 MG tablet Take 10 mg by mouth daily.     . Cholecalciferol (VITAMIN D3) 5000 units TABS Take 5,000 Units by mouth daily.    . montelukast (SINGULAIR) 10 MG tablet Take 1 tablet (10 mg total) by mouth daily. 90 tablet 3  . sertraline (ZOLOFT) 50 MG tablet Take 1 tablet (50 mg total) by mouth daily. 30 tablet 11  . SYNTHROID 175 MCG tablet Take 1 tablet (175 mcg total) by mouth daily. 90 tablet 3  . metFORMIN (GLUCOPHAGE-XR) 750 MG 24 hr tablet Take 1 tablet (750 mg total) by mouth 2 (two) times daily. 60 tablet 2  . metoprolol tartrate  (LOPRESSOR) 25 MG tablet Take 1 tablet (25 mg total) by mouth 2 (two) times daily. (Patient taking differently: Take 50 mg by mouth 2 (two) times daily. ) 60 tablet 0   No facility-administered medications prior to visit.      Per HPI unless specifically indicated in ROS section below Review of Systems  Constitutional: Negative for activity change, appetite change, chills, fatigue, fever and unexpected weight change.  HENT: Negative for hearing loss.   Eyes: Negative for visual disturbance.  Respiratory: Negative for cough, chest tightness, shortness of breath and wheezing.   Cardiovascular: Positive for palpitations (PVCs). Negative for chest pain and leg swelling.  Gastrointestinal: Negative for abdominal distention, abdominal pain, blood in stool, constipation, diarrhea, nausea and vomiting.  Genitourinary: Negative for difficulty urinating and hematuria.  Musculoskeletal: Negative for arthralgias, myalgias and neck pain.  Skin: Negative for rash.  Neurological: Positive for headaches (tension). Negative for dizziness, seizures and syncope.  Hematological: Negative for adenopathy. Does not bruise/bleed easily.  Psychiatric/Behavioral: Negative for dysphoric mood. The patient is not nervous/anxious.        Objective:    BP 130/66   Pulse 75   Temp 98.1 F (36.7 C) (Oral)   Ht 5' 2.25" (1.581 m)   Wt (!) 359 lb (162.8 kg)   LMP 12/31/2016  SpO2 97%   BMI 65.14 kg/m   Wt Readings from Last 3 Encounters:  01/06/17 (!) 359 lb (162.8 kg)  12/22/16 (!) 361 lb (163.7 kg)  12/09/16 (!) 361 lb (163.7 kg)    Physical Exam  Constitutional: She is oriented to person, place, and time. She appears well-developed and well-nourished. No distress.  HENT:  Head: Normocephalic and atraumatic.  Right Ear: Hearing, tympanic membrane, external ear and ear canal normal.  Left Ear: Hearing, tympanic membrane, external ear and ear canal normal.  Nose: Nose normal.  Mouth/Throat: Uvula is  midline, oropharynx is clear and moist and mucous membranes are normal. No oropharyngeal exudate, posterior oropharyngeal edema or posterior oropharyngeal erythema.  Eyes: Pupils are equal, round, and reactive to light. Conjunctivae and EOM are normal. No scleral icterus.  Neck: Normal range of motion. Neck supple. No thyromegaly present.  Cardiovascular: Normal rate, regular rhythm, normal heart sounds and intact distal pulses.   No murmur heard. Pulses:      Radial pulses are 2+ on the right side, and 2+ on the left side.  Pulmonary/Chest: Effort normal and breath sounds normal. No respiratory distress. She has no wheezes. She has no rales.  Abdominal: Soft. Bowel sounds are normal. She exhibits no distension and no mass. There is no tenderness. There is no rebound and no guarding.  Musculoskeletal: Normal range of motion. She exhibits no edema.  Lymphadenopathy:    She has no cervical adenopathy.  Neurological: She is alert and oriented to person, place, and time.  CN grossly intact, station and gait intact  Skin: Skin is warm and dry. No rash noted.  Psychiatric: She has a normal mood and affect. Her behavior is normal. Judgment and thought content normal.  Nursing note and vitals reviewed.  Results for orders placed or performed in visit on 01/01/17  Comprehensive metabolic panel  Result Value Ref Range   Sodium 138 135 - 145 mEq/L   Potassium 4.1 3.5 - 5.1 mEq/L   Chloride 103 96 - 112 mEq/L   CO2 29 19 - 32 mEq/L   Glucose, Bld 101 (H) 70 - 99 mg/dL   BUN 10 6 - 23 mg/dL   Creatinine, Ser 0.71 0.40 - 1.20 mg/dL   Total Bilirubin 0.3 0.2 - 1.2 mg/dL   Alkaline Phosphatase 77 39 - 117 U/L   AST 14 0 - 37 U/L   ALT 13 0 - 35 U/L   Total Protein 7.2 6.0 - 8.3 g/dL   Albumin 4.0 3.5 - 5.2 g/dL   Calcium 8.6 8.4 - 10.5 mg/dL   GFR 97.86 >60.00 mL/min  Lipid panel  Result Value Ref Range   Cholesterol 235 (H) 0 - 200 mg/dL   Triglycerides 178.0 (H) 0.0 - 149.0 mg/dL   HDL  40.50 >39.00 mg/dL   VLDL 35.6 0.0 - 40.0 mg/dL   LDL Cholesterol 159 (H) 0 - 99 mg/dL   Total CHOL/HDL Ratio 6    NonHDL 194.11       Assessment & Plan:   Problem List Items Addressed This Visit    BMI 60.0-69.9, adult ([place])    Reviewed healthy diet and lifestyle changes to affect sustainable weight loss.       Relevant Medications   metFORMIN (GLUCOPHAGE-XR) 750 MG 24 hr tablet   Health maintenance examination - Primary    Preventative protocols reviewed and updated unless pt declined. Discussed healthy diet and lifestyle.       Hyperlipidemia    Reviewed chol  levels, discussed healthy diet and lifestyle changes to improve lipid control.  The ASCVD Risk score Mikey Bussing DC Jr., et al., 2013) failed to calculate for the following reasons:   The 2013 ASCVD risk score is only valid for ages 42 to 73       MDD (major depressive disorder), recurrent episode, moderate (HCC)    Stable period on lower zoloft dose. Pt may try full taper off SSRI.       PCOS (polycystic ovarian syndrome)    Appreciate endo care.       Postsurgical hypothyroidism    Appreciate endo care.       Vitamin D deficiency    Levels now normalized on current regimen of 5000 IU daily.           Follow up plan: Return in about 6 months (around 07/09/2017) for follow up visit.  Ria Bush, MD

## 2017-01-06 NOTE — Assessment & Plan Note (Signed)
Appreciate endo care.  

## 2017-01-06 NOTE — Assessment & Plan Note (Signed)
Reviewed healthy diet and lifestyle changes to affect sustainable weight loss.  

## 2017-01-06 NOTE — Assessment & Plan Note (Signed)
Preventative protocols reviewed and updated unless pt declined. Discussed healthy diet and lifestyle.  

## 2017-01-06 NOTE — Assessment & Plan Note (Signed)
Levels now normalized on current regimen of 5000 IU daily.

## 2017-01-06 NOTE — Assessment & Plan Note (Signed)
Reviewed chol levels, discussed healthy diet and lifestyle changes to improve lipid control.  The ASCVD Risk score Linda Bussing DC Jr., et al., 2013) failed to calculate for the following reasons:   The 2013 ASCVD risk score is only valid for ages 35 to 36

## 2017-01-06 NOTE — Assessment & Plan Note (Signed)
Stable period on lower zoloft dose. Pt may try full taper off SSRI.

## 2017-01-06 NOTE — Patient Instructions (Addendum)
You are doing well today. Continue current medicines.  Low cholesterol diet handout provided today. Work on regular activity, work on more fatty fish in diet, and increase in legumes to help cholesterol levels.  Return as needed or in 6 months for follow up visit   Health Maintenance, Female Adopting a healthy lifestyle and getting preventive care can go a long way to promote health and wellness. Talk with your health care provider about what schedule of regular examinations is right for you. This is a good chance for you to check in with your provider about disease prevention and staying healthy. In between checkups, there are plenty of things you can do on your own. Experts have done a lot of research about which lifestyle changes and preventive measures are most likely to keep you healthy. Ask your health care provider for more information. Weight and diet Eat a healthy diet  Be sure to include plenty of vegetables, fruits, low-fat dairy products, and lean protein.  Do not eat a lot of foods high in solid fats, added sugars, or salt.  Get regular exercise. This is one of the most important things you can do for your health. ? Most adults should exercise for at least 150 minutes each week. The exercise should increase your heart rate and make you sweat (moderate-intensity exercise). ? Most adults should also do strengthening exercises at least twice a week. This is in addition to the moderate-intensity exercise.  Maintain a healthy weight  Body mass index (BMI) is a measurement that can be used to identify possible weight problems. It estimates body fat based on height and weight. Your health care provider can help determine your BMI and help you achieve or maintain a healthy weight.  For females 81 years of age and older: ? A BMI below 18.5 is considered underweight. ? A BMI of 18.5 to 24.9 is normal. ? A BMI of 25 to 29.9 is considered overweight. ? A BMI of 30 and above is considered  obese.  Watch levels of cholesterol and blood lipids  You should start having your blood tested for lipids and cholesterol at 38 years of age, then have this test every 5 years.  You may need to have your cholesterol levels checked more often if: ? Your lipid or cholesterol levels are high. ? You are older than 37 years of age. ? You are at high risk for heart disease.  Cancer screening Lung Cancer  Lung cancer screening is recommended for adults 7-61 years old who are at high risk for lung cancer because of a history of smoking.  A yearly low-dose CT scan of the lungs is recommended for people who: ? Currently smoke. ? Have quit within the past 15 years. ? Have at least a 30-pack-year history of smoking. A pack year is smoking an average of one pack of cigarettes a day for 1 year.  Yearly screening should continue until it has been 15 years since you quit.  Yearly screening should stop if you develop a health problem that would prevent you from having lung cancer treatment.  Breast Cancer  Practice breast self-awareness. This means understanding how your breasts normally appear and feel.  It also means doing regular breast self-exams. Let your health care provider know about any changes, no matter how small.  If you are in your 20s or 30s, you should have a clinical breast exam (CBE) by a health care provider every 1-3 years as part of a regular health  exam.  If you are 40 or older, have a CBE every year. Also consider having a breast X-ray (mammogram) every year.  If you have a family history of breast cancer, talk to your health care provider about genetic screening.  If you are at high risk for breast cancer, talk to your health care provider about having an MRI and a mammogram every year.  Breast cancer gene (BRCA) assessment is recommended for women who have family members with BRCA-related cancers. BRCA-related cancers  include: ? Breast. ? Ovarian. ? Tubal. ? Peritoneal cancers.  Results of the assessment will determine the need for genetic counseling and BRCA1 and BRCA2 testing.  Cervical Cancer Your health care provider may recommend that you be screened regularly for cancer of the pelvic organs (ovaries, uterus, and vagina). This screening involves a pelvic examination, including checking for microscopic changes to the surface of your cervix (Pap test). You may be encouraged to have this screening done every 3 years, beginning at age 55.  For women ages 59-65, health care providers may recommend pelvic exams and Pap testing every 3 years, or they may recommend the Pap and pelvic exam, combined with testing for human papilloma virus (HPV), every 5 years. Some types of HPV increase your risk of cervical cancer. Testing for HPV may also be done on women of any age with unclear Pap test results.  Other health care providers may not recommend any screening for nonpregnant women who are considered low risk for pelvic cancer and who do not have symptoms. Ask your health care provider if a screening pelvic exam is right for you.  If you have had past treatment for cervical cancer or a condition that could lead to cancer, you need Pap tests and screening for cancer for at least 20 years after your treatment. If Pap tests have been discontinued, your risk factors (such as having a new sexual partner) need to be reassessed to determine if screening should resume. Some women have medical problems that increase the chance of getting cervical cancer. In these cases, your health care provider may recommend more frequent screening and Pap tests.  Colorectal Cancer  This type of cancer can be detected and often prevented.  Routine colorectal cancer screening usually begins at 38 years of age and continues through 38 years of age.  Your health care provider may recommend screening at an earlier age if you have risk factors  for colon cancer.  Your health care provider may also recommend using home test kits to check for hidden blood in the stool.  A small camera at the end of a tube can be used to examine your colon directly (sigmoidoscopy or colonoscopy). This is done to check for the earliest forms of colorectal cancer.  Routine screening usually begins at age 75.  Direct examination of the colon should be repeated every 5-10 years through 38 years of age. However, you may need to be screened more often if early forms of precancerous polyps or small growths are found.  Skin Cancer  Check your skin from head to toe regularly.  Tell your health care provider about any new moles or changes in moles, especially if there is a change in a mole's shape or color.  Also tell your health care provider if you have a mole that is larger than the size of a pencil eraser.  Always use sunscreen. Apply sunscreen liberally and repeatedly throughout the day.  Protect yourself by wearing long sleeves, pants, a wide-brimmed  hat, and sunglasses whenever you are outside.  Heart disease, diabetes, and high blood pressure  High blood pressure causes heart disease and increases the risk of stroke. High blood pressure is more likely to develop in: ? People who have blood pressure in the high end of the normal range (130-139/85-89 mm Hg). ? People who are overweight or obese. ? People who are African American.  If you are 77-97 years of age, have your blood pressure checked every 3-5 years. If you are 30 years of age or older, have your blood pressure checked every year. You should have your blood pressure measured twice-once when you are at a hospital or clinic, and once when you are not at a hospital or clinic. Record the average of the two measurements. To check your blood pressure when you are not at a hospital or clinic, you can use: ? An automated blood pressure machine at a pharmacy. ? A home blood pressure monitor.  If  you are between 7 years and 48 years old, ask your health care provider if you should take aspirin to prevent strokes.  Have regular diabetes screenings. This involves taking a blood sample to check your fasting blood sugar level. ? If you are at a normal weight and have a low risk for diabetes, have this test once every three years after 38 years of age. ? If you are overweight and have a high risk for diabetes, consider being tested at a younger age or more often. Preventing infection Hepatitis B  If you have a higher risk for hepatitis B, you should be screened for this virus. You are considered at high risk for hepatitis B if: ? You were born in a country where hepatitis B is common. Ask your health care provider which countries are considered high risk. ? Your parents were born in a high-risk country, and you have not been immunized against hepatitis B (hepatitis B vaccine). ? You have HIV or AIDS. ? You use needles to inject street drugs. ? You live with someone who has hepatitis B. ? You have had sex with someone who has hepatitis B. ? You get hemodialysis treatment. ? You take certain medicines for conditions, including cancer, organ transplantation, and autoimmune conditions.  Hepatitis C  Blood testing is recommended for: ? Everyone born from [birth year] through 1967. ? Anyone with known risk factors for hepatitis C.  Sexually transmitted infections (STIs)  You should be screened for sexually transmitted infections (STIs) including gonorrhea and chlamydia if: ? You are sexually active and are younger than 38 years of age. ? You are older than 38 years of age and your health care provider tells you that you are at risk for this type of infection. ? Your sexual activity has changed since you were last screened and you are at an increased risk for chlamydia or gonorrhea. Ask your health care provider if you are at risk.  If you do not have HIV, but are at risk, it may be recommended  that you take a prescription medicine daily to prevent HIV infection. This is called pre-exposure prophylaxis (PrEP). You are considered at risk if: ? You are sexually active and do not regularly use condoms or know the HIV status of your partner(s). ? You take drugs by injection. ? You are sexually active with a partner who has HIV.  Talk with your health care provider about whether you are at high risk of being infected with HIV. If you choose to begin  PrEP, you should first be tested for HIV. You should then be tested every 3 months for as long as you are taking PrEP. Pregnancy  If you are premenopausal and you may become pregnant, ask your health care provider about preconception counseling.  If you may become pregnant, take 400 to 800 micrograms (mcg) of folic acid every day.  If you want to prevent pregnancy, talk to your health care provider about birth control (contraception). Osteoporosis and menopause  Osteoporosis is a disease in which the bones lose minerals and strength with aging. This can result in serious bone fractures. Your risk for osteoporosis can be identified using a bone density scan.  If you are 22 years of age or older, or if you are at risk for osteoporosis and fractures, ask your health care provider if you should be screened.  Ask your health care provider whether you should take a calcium or vitamin D supplement to lower your risk for osteoporosis.  Menopause may have certain physical symptoms and risks.  Hormone replacement therapy may reduce some of these symptoms and risks. Talk to your health care provider about whether hormone replacement therapy is right for you. Follow these instructions at home:  Schedule regular health, dental, and eye exams.  Stay current with your immunizations.  Do not use any tobacco products including cigarettes, chewing tobacco, or electronic cigarettes.  If you are pregnant, do not drink alcohol.  If you are  breastfeeding, limit how much and how often you drink alcohol.  Limit alcohol intake to no more than 1 drink per day for nonpregnant women. One drink equals 12 ounces of beer, 5 ounces of wine, or 1 ounces of hard liquor.  Do not use street drugs.  Do not share needles.  Ask your health care provider for help if you need support or information about quitting drugs.  Tell your health care provider if you often feel depressed.  Tell your health care provider if you have ever been abused or do not feel safe at home. This information is not intended to replace advice given to you by your health care provider. Make sure you discuss any questions you have with your health care provider. Document Released: 11/25/2010 Document Revised: 10/18/2015 Document Reviewed: 02/13/2015 Elsevier Interactive Patient Education  [employer].

## 2017-01-12 ENCOUNTER — Other Ambulatory Visit: Payer: Self-pay | Admitting: *Deleted

## 2017-01-12 ENCOUNTER — Other Ambulatory Visit: Payer: Self-pay | Admitting: Family Medicine

## 2017-01-12 MED ORDER — MONTELUKAST SODIUM 10 MG PO TABS: 10.0000 mg | ORAL_TABLET | Freq: Every day | ORAL | 1 refills | Status: DC

## 2017-04-02 ENCOUNTER — Encounter: Payer: Self-pay | Admitting: Family Medicine

## 2017-04-06 MED ORDER — SERTRALINE HCL 100 MG PO TABS: 100.0000 mg | ORAL_TABLET | Freq: Every day | ORAL | 11 refills | Status: DC

## 2017-06-19 ENCOUNTER — Other Ambulatory Visit: Payer: Self-pay | Admitting: Family Medicine

## 2017-06-22 ENCOUNTER — Encounter: Payer: Self-pay | Admitting: Internal Medicine

## 2017-06-22 ENCOUNTER — Ambulatory Visit (INDEPENDENT_AMBULATORY_CARE_PROVIDER_SITE_OTHER): Payer: Managed Care, Other (non HMO) | Admitting: Internal Medicine

## 2017-06-22 VITALS — BP 138/82 | HR 81 | Ht 62.0 in | Wt 365.2 lb

## 2017-06-22 DIAGNOSIS — E890 Postprocedural hypothyroidism: Secondary | ICD-10-CM | POA: Diagnosis not present

## 2017-06-22 DIAGNOSIS — E559 Vitamin D deficiency, unspecified: Secondary | ICD-10-CM | POA: Diagnosis not present

## 2017-06-22 DIAGNOSIS — C73 Malignant neoplasm of thyroid gland: Secondary | ICD-10-CM

## 2017-06-22 LAB — T4, FREE: Free T4: 1.01 ng/dL (ref 0.60–1.60)

## 2017-06-22 LAB — T3, FREE: T3 FREE: 3.7 pg/mL (ref 2.3–4.2)

## 2017-06-22 LAB — VITAMIN D 25 HYDROXY (VIT D DEFICIENCY, FRACTURES): VITD: 29.92 ng/mL — ABNORMAL LOW (ref 30.00–100.00)

## 2017-06-22 LAB — TSH: TSH: 2.22 u[IU]/mL (ref 0.35–4.50)

## 2017-06-22 MED ORDER — METFORMIN HCL ER 750 MG PO TB24: 750.0000 mg | ORAL_TABLET | Freq: Every day | ORAL | 3 refills | Status: DC

## 2017-06-22 NOTE — Patient Instructions (Signed)
Please stop at the lab.  Please continue Synthroid 175 mcg daily.  .Take the thyroid hormone every day, with water, at least 30 minutes before breakfast, separated by at least 4 hours from: - acid reflux medications - calcium - iron - multivitamins  Continue 5000 units daily.  Please come back for a follow-up appointment in 1 year.

## 2017-06-22 NOTE — Progress Notes (Signed)
Patient ID: Linda Welch, female   DOB: Jan 26, 1981, 37 y.o.   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  Last visit 6 months ago.  She had flu A over Christmas.   Reviewed her cancer history: - dx in in 02/2009 >> 3 cm nodule felt by pt >> seen by PCP >> referred to endo >> uptake and scan >> cold nodule >> FNA by Dr Francoise Schaumann: benign (bad experience).  - thyroid U/S 2010: 3.1 x 2.6 x 2.2 cm complex, slightly hypoechoic, nodule, situated in the mid pole of the right thyroid lobe - Thyroid ultrasound 02/06/2014: Large nodule occupying most of the right lobe measures 5.2 x 3.2 x 4.1 cm. There are some internal calcifications. It previously measured 3.1 x 2.6 x 2.2 cm based on the prior report. - FNA 00/37/0488: FOLLICULAR NEOPLASM AND/OR LESION (BETHESDA IV)  - Right lobectomy 05/16/2014 and completion thyroidectomy 06/01/2014 Thyroid, lobectomy, right - FOLLICULAR VARIANT OF PAPILLARY THYROID CARCINOMA WITH CAPSULAR INVASION, 4.2 CM, CONFINED WITHIN THYROIDAL TISSUE. - NO EVIDENCE OF ANGIOLYMPHATIC INVASION IDENTIFIED. - RESECTION MARGINS, NEGATIVE FOR ATYPIA OR MALIGNANCY. Microscopic Comment THYROID Specimen: Right thyroid Procedure: Right hemithyroidectomy Specimen Integrity (intact/fragmented): Intact Tumor focality: Unifocal Maximum tumor size (cm): 4.2 cm, gross description Tumor laterality: Right thyroid gland Histologic type (including subtype and/or unique features as applicable): Follicular variant of papillary thyroid carcinoma Tumor capsule: Present Extrathyroidal extension: No Margins: Negative Lymph - Vascular invasion: Not identified Capsular invasion with degree of invasion if present: Present, complete Lymph nodes: # examined N/A; # positive; N/A TNM code: pT3 , pNX Non-neoplastic thyroid: Unremarkable. Due to the low risk of her  thyroid tumor, I did not recommend I-131 remnant ablation.  01/07/2016: Neck ultrasound: Surgical changes of total thyroidectomy without evidence of residual thyroid tissue, nodularity or lymphadenopathy.  Hypothyroidism: Pt. has been dx with hypothyroidism in 2010, now postoperative hypothyroidism; is on Synthroid DAW.  Pt is on Synthroid 175 mcg daily, taken: - in am - fasting - at least 30 min from b'fast - Off Ca, PPIs  - no Fe, MVI  - not on Biotin  I reviewed pt's thyroid tests: Lab Results  Component Value Date   TSH 2.99 12/22/2016   TSH 4.612 (H) 07/15/2016   TSH 1.40 06/23/2016   TSH 7.61 (H) 12/24/2015   TSH 8.12 (H) 10/08/2015   FREET4 0.94 12/22/2016   FREET4 0.91 06/23/2016   FREET4 0.94 12/24/2015   FREET4 0.84 10/08/2015   FREET4 0.72 08/13/2015  03/29/2013: TSH 0.93, fT4 1.03 02/04/2013 (towards end of pregnancy): TSH 0.12, fT4 0.96 >> Synthroid reduced from 100 to 88 mcg 12/2012: TSH 0.07 >> Synthroid reduced from 112 to 100 mcg  Thyroglobulin levels decreasing: Lab Results  Component Value Date   THYROGLB 0.3 (L) 12/22/2016   THYROGLB 0.6 (L) 12/24/2015   THGAB <1 12/22/2016   THGAB <1 12/24/2015   Pt denies: - feeling nodules in neck - dysphagia - choking - SOB with lying down + hoarseness 2/2 flu   She was diagnosed with vitamin D insufficiency (9)  - Continues on 5000 units vitamin D daily  Lab Results  Component Value Date   VD25OH 34.67 12/22/2016   VD25OH 23.51 (L) 06/23/2016   VD25OH 9.6 (L) 11/05/2015    She is having palpitations >> started fall 2017 after we last increased her LT4, but exacerbated 06/2016. She had full cardiac  investigation including a Holter monitor >> PVC and PACs (217 events per 48h). On Metoprolol 1-2x  A day.  + PCOS. Last A1c: Lab Results  Component Value Date   HGBA1C 5.8 08/13/2015    ROS: Constitutional: + weight gain/no weight loss, + fatigue, + subjective hyperthermia, no subjective  hypothermia Eyes: no blurry vision, no xerophthalmia ENT: no sore throat,+ see HPI Cardiovascular: no CP/no SOB/+ palpitations/no leg swelling Respiratory: + cough/no SOB/no wheezing Gastrointestinal: no N/no V/+ D/no C/+ acid reflux Musculoskeletal: + muscle aches/+ joint aches Skin: no rashes, + hair loss Neurological: no tremors/no numbness/no tingling/no dizziness, + HA  I reviewed pt's medications, allergies, PMH, social hx, family hx, and changes were documented in the history of present illness. Otherwise, unchanged from my initial visit note.  PE: BP 138/82   Pulse 81   Ht '5\' 2"'  (1.575 m)   Wt (!) 365 lb 3.2 oz (165.7 kg)   LMP 05/29/2017   SpO2 97%   BMI 66.80 kg/m  Wt Readings from Last 3 Encounters:  06/22/17 (!) 365 lb 3.2 oz (165.7 kg)  01/06/17 (!) 359 lb (162.8 kg)  12/22/16 (!) 361 lb (163.7 kg)   Constitutional: overweight, in NAD Eyes: PERRLA, EOMI, no exophthalmos ENT: moist mucous membranes, no neck masses palpated, no cervical lymphadenopathy Cardiovascular: RRR, No MRG Respiratory: CTA B Gastrointestinal: abdomen soft, NT, ND, BS+ Musculoskeletal: no deformities, strength intact in all 4 Skin: moist, warm, no rashes Neurological: no tremor with outstretched hands, DTR normal in all 4  ASSESSMENT: 1. Follicular variant of PTC  2. Postsurgical hypothyroidism  3. Vitamin D deficiency  PLAN:  1. PTC - Patient with follicular variant of papillary thyroid cancer, the least aggressive type of cancer.  This was encapsulated and it did not spread outside the thyroid.  She is stage I TNM and her prognosis is excellent. - She feels no masses in her neck, no problems swallowing, no hoarseness, no shortness of breath with laying down - Reviewed latest ultrasound report from 11/2015: No recurrence or metastasis - Also reviewed her thyroglobulin and ATA from 11/2016: Thyroglobulin was low, but not undetectable, as expected, since she did not have RAI treatment  due to low risk.  We again discussed that the trend in thyroglobulin and not the absolute value is the most important parameter during and following her cancer. - We will recheck her thyroglobulin and thyroglobulin antibodies today  2. Hypothyroidism - latest thyroid labs reviewed with pt >> normal  - she continues on Synthroid d.a.w. 175 Mcg daily - pt feels good on this dose. - we discussed about taking the thyroid hormone every day, with water, >30 minutes before breakfast, separated by >4 hours from acid reflux medications, calcium, iron, multivitamins. Pt. is taking it correctly - will check thyroid tests today: TSH and fT4 - If labs are abnormal, she will need to return for repeat TFTs in 1.5 months - OTW, RTC in 1 year  3. Vitamin D deficiency - latest level from last visit reviewed >> normal - Continues on 5000 units daily - We will recheck her level today  Needs refills LT4.  Component     Latest Ref Rng & Units 06/22/2017  Thyroglobulin     ng/mL 0.2 (L)  Comment        TSH     0.35 - 4.50 uIU/mL 2.22  T4,Free(Direct)     0.60 - 1.60 ng/dL 1.01  Thyroglobulin Ab     < or = 1 IU/mL <  1  VITD     30.00 - 100.00 ng/mL 29.92 (L)  Triiodothyronine,Free,Serum     2.3 - 4.2 pg/mL 3.7  TFTs normal, Tg decreasing. Vit D a little low >> need to take the supplement consistently.   Philemon Kingdom, MD PhD [facility]

## 2017-06-23 LAB — THYROGLOBULIN LEVEL: Thyroglobulin: 0.2 ng/mL — ABNORMAL LOW

## 2017-06-23 LAB — THYROGLOBULIN ANTIBODY

## 2017-06-23 MED ORDER — SYNTHROID 175 MCG PO TABS: 175.0000 ug | ORAL_TABLET | Freq: Every day | ORAL | 3 refills | Status: DC

## 2017-07-11 ENCOUNTER — Other Ambulatory Visit: Payer: Self-pay | Admitting: Family Medicine

## 2017-07-13 ENCOUNTER — Encounter: Payer: Self-pay | Admitting: Family Medicine

## 2017-07-13 ENCOUNTER — Ambulatory Visit: Payer: Managed Care, Other (non HMO) | Admitting: Family Medicine

## 2017-07-13 VITALS — BP 126/88 | HR 72 | Temp 98.4°F | Wt 359.0 lb

## 2017-07-13 DIAGNOSIS — F331 Major depressive disorder, recurrent, moderate: Secondary | ICD-10-CM

## 2017-07-13 DIAGNOSIS — G44209 Tension-type headache, unspecified, not intractable: Secondary | ICD-10-CM | POA: Insufficient documentation

## 2017-07-13 DIAGNOSIS — G4733 Obstructive sleep apnea (adult) (pediatric): Secondary | ICD-10-CM

## 2017-07-13 DIAGNOSIS — Z9989 Dependence on other enabling machines and devices: Secondary | ICD-10-CM

## 2017-07-13 DIAGNOSIS — G44219 Episodic tension-type headache, not intractable: Secondary | ICD-10-CM

## 2017-07-13 DIAGNOSIS — Z23 Encounter for immunization: Secondary | ICD-10-CM

## 2017-07-13 DIAGNOSIS — M25562 Pain in left knee: Secondary | ICD-10-CM

## 2017-07-13 MED ORDER — NYSTATIN 100000 UNIT/GM EX POWD: Freq: Four times a day (QID) | CUTANEOUS | 1 refills | Status: DC

## 2017-07-13 MED ORDER — MONTELUKAST SODIUM 10 MG PO TABS: 10.0000 mg | ORAL_TABLET | Freq: Every day | ORAL | 3 refills | Status: DC

## 2017-07-13 MED ORDER — SERTRALINE HCL 100 MG PO TABS: 100.0000 mg | ORAL_TABLET | Freq: Every day | ORAL | 3 refills | Status: DC

## 2017-07-13 NOTE — Progress Notes (Signed)
BP 126/88 (BP Location: Left Arm, Patient Position: Sitting, Cuff Size: Large)   Pulse 72   Temp 98.4 F (36.9 C) (Oral)   Wt (!) 359 lb (162.8 kg)   LMP 07/12/2017   SpO2 95%   BMI 65.66 kg/m    CC: 6 mo f/u visit Subjective:    Patient ID: Linda Welch, female    DOB: [birth year], 37 y.o.   MRN: [id number]  HPI: Linda Welch is a 37 y.o. 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   She is back up to zoloft 100mg  daily.   Headache - endorses worsening and more frequent daily tension headache (20/30 days per month). This started after she contracted the flu 04/2017. Denies inciting trauma/injury. Describes pain in neck, shoulders and band like pain throughout head. Wakes up with headaches but they do not awaken her. She has changed pillow without significant improvement. Treats with ibuprofen without relief. No vision changes, photo/phonophobia, or aura. Previously had similar episode 2006 treated with flexeril - but she is worried about its effect on sleep apnea - she uses CPAP regularly. She is stretching neck. H/o migraines - this is different.   Intermittent L posterior knee pain, fullness, and tightness. Describes burning pain. Denies inciting trauma/injury to knee. Comes and goes.   Rashes under breast dx as intertrigo before - she requests refill of nystatin cream/powder.   Relevant past medical, surgical, family and social history reviewed and updated as indicated. Interim medical history since our last visit reviewed. Allergies and medications reviewed and updated. Outpatient Medications Prior to Visit  Medication Sig Dispense Refill  . cetirizine (ZYRTEC) 10 MG tablet Take 10 mg by mouth daily.     . Cholecalciferol (VITAMIN D3) 5000 units TABS Take 5,000 Units by mouth daily.    . metFORMIN (GLUCOPHAGE-XR) 750 MG 24 hr tablet Take 1 tablet (750 mg total) by mouth at  bedtime. 90 tablet 3  . SYNTHROID 175 MCG tablet Take 1 tablet (175 mcg total) by mouth daily. 90 tablet 3  . azelastine (ASTELIN) 0.1 % nasal spray Place 1 spray into both nostrils 2 (two) times daily. Use in each nostril as directed 30 mL 6  . montelukast (SINGULAIR) 10 MG tablet Take 1 tablet (10 mg total) by mouth daily. 90 tablet 1  . sertraline (ZOLOFT) 100 MG tablet Take 1 tablet (100 mg total) daily by mouth. 30 tablet 11  . metoprolol tartrate (LOPRESSOR) 50 MG tablet Take 1 tablet (50 mg total) by mouth 2 (two) times daily as needed (palpitations).    . metoprolol tartrate (LOPRESSOR) 25 MG tablet Take 1 tablet (25 mg total) by mouth 2 (two) times daily. (Patient taking differently: Take 50 mg by mouth 2 (two) times daily. ) 60 tablet 0   No facility-administered medications prior to visit.      Per HPI unless specifically indicated in ROS section below Review of Systems     Objective:    BP 126/88 (BP Location: Left Arm, Patient Position: Sitting, Cuff Size: Large)   Pulse 72   Temp 98.4 F (36.9 C) (Oral)   Wt (!) 359 lb (162.8 kg)   LMP 07/12/2017   SpO2 95%   BMI 65.66 kg/m   Wt Readings from Last 3 Encounters:  07/13/17 (!) 359 lb (162.8 kg)  06/22/17 (!) 365 lb 3.2 oz (165.7 kg)  01/06/17 (!) 359 lb (162.8  kg)    Physical Exam  Constitutional: She is oriented to person, place, and time. She appears well-developed and well-nourished. No distress.  HENT:  Mouth/Throat: Oropharynx is clear and moist. No oropharyngeal exudate.  Eyes: Conjunctivae and EOM are normal. Pupils are equal, round, and reactive to light. No scleral icterus.  Neck: Normal range of motion. Neck supple.  FROM at neck  Cardiovascular: Normal rate, regular rhythm, normal heart sounds and intact distal pulses.  No murmur heard. Pulmonary/Chest: Effort normal and breath sounds normal. No respiratory distress. She has no wheezes.  Musculoskeletal: She exhibits no edema.  No midline cervical  spine tenderness + tender to palpation bilat trapezius mm R knee WNL L knee - mild tenderness/fullness to palpation at popliteal region FROM knees with some crepitus bilaterally  Lymphadenopathy:    She has no cervical adenopathy.  Neurological: She is alert and oriented to person, place, and time. No cranial nerve deficit. Coordination normal.  CN 2-12 intact Station and gait intact FTN intact EOMI  Skin: Skin is warm and dry. No rash noted. No erythema.  Psychiatric: She has a normal mood and affect.  Nursing note and vitals reviewed.  Results for orders placed or performed in visit on 06/22/17  T4, free  Result Value Ref Range   Free T4 1.01 0.60 - 1.60 ng/dL  T3, free  Result Value Ref Range   T3, Free 3.7 2.3 - 4.2 pg/mL  TSH  Result Value Ref Range   TSH 2.22 0.35 - 4.50 uIU/mL  Vitamin D, 25-hydroxy  Result Value Ref Range   VITD 29.92 (L) 30.00 - 100.00 ng/mL  Thyroglobulin antibody  Result Value Ref Range   Thyroglobulin Ab <1 < or = 1 IU/mL  Thyroglobulin Level  Result Value Ref Range   Thyroglobulin 0.2 (L) ng/mL   Comment        Assessment & Plan:   Problem List Items Addressed This Visit    MDD (major depressive disorder), recurrent episode, moderate (HCC)    Stable on zoloft 100mg  daily. Continue.       Relevant Medications   sertraline (ZOLOFT) 100 MG tablet   OSA on CPAP    Continue CPAP. Caution with sedating medication.       Posterior left knee pain    Anticipate baker's cyst. Reviewed with patient, handout provided. rec PRN NSAID, start using knee sleeve. Update with effect.       TTH (tension-type headache) - Primary    Story/ exam consistent with frequent TTH. Non focal neurological exam. Encouraged gentle stretching to neck. Rx trial flexeril. Update with effect.       Relevant Medications   sertraline (ZOLOFT) 100 MG tablet   metoprolol tartrate (LOPRESSOR) 50 MG tablet       Meds ordered this encounter  Medications  .  montelukast (SINGULAIR) 10 MG tablet    Sig: Take 1 tablet (10 mg total) by mouth daily.    Dispense:  90 tablet    Refill:  3  . sertraline (ZOLOFT) 100 MG tablet    Sig: Take 1 tablet (100 mg total) by mouth daily.    Dispense:  90 tablet    Refill:  3  . nystatin (MYCOSTATIN/NYSTOP) powder    Sig: Apply topically 4 (four) times daily.    Dispense:  60 g    Refill:  1   No orders of the defined types were placed in this encounter.   Follow up plan: Return in about 6 months (  around 01/10/2018), or if symptoms worsen or fail to improve, for annual exam, prior fasting for blood work.  Ria Bush, MD

## 2017-07-13 NOTE — Addendum Note (Signed)
Addended by: Brenton Grills on: 0/47/9987 21:58 AM   Modules accepted: Orders

## 2017-07-13 NOTE — Patient Instructions (Addendum)
For tension headaches - re trial flexeril muscle relaxant 5-10 mg (1/2-1 tab) as needed at night time (sedation precautions). If not improving, let me know to consider daily prevention medicine.  I think your knee pain is coming from baker's cyst. Look at handout provided today.

## 2017-07-13 NOTE — Assessment & Plan Note (Addendum)
Story/ exam consistent with frequent TTH. Non focal neurological exam. Encouraged gentle stretching to neck. Rx trial flexeril. Update with effect.

## 2017-07-13 NOTE — Assessment & Plan Note (Signed)
Anticipate baker's cyst. Reviewed with patient, handout provided. rec PRN NSAID, start using knee sleeve. Update with effect.

## 2017-07-13 NOTE — Assessment & Plan Note (Signed)
Stable on zoloft 100mg  daily. Continue.

## 2017-07-13 NOTE — Assessment & Plan Note (Signed)
Continue CPAP. Caution with sedating medication.

## 2017-07-16 ENCOUNTER — Encounter: Payer: Self-pay | Admitting: Family Medicine

## 2017-07-16 MED ORDER — CYCLOBENZAPRINE HCL 10 MG PO TABS: 5.0000 mg | ORAL_TABLET | Freq: Two times a day (BID) | ORAL | 0 refills | Status: DC | PRN

## 2017-07-20 ENCOUNTER — Ambulatory Visit: Payer: Managed Care, Other (non HMO) | Admitting: Internal Medicine

## 2017-07-20 ENCOUNTER — Encounter: Payer: Self-pay | Admitting: Internal Medicine

## 2017-07-20 VITALS — BP 128/80 | HR 95 | Temp 98.8°F | Ht 62.0 in | Wt 361.4 lb

## 2017-07-20 DIAGNOSIS — H8110 Benign paroxysmal vertigo, unspecified ear: Secondary | ICD-10-CM

## 2017-07-20 DIAGNOSIS — H6983 Other specified disorders of Eustachian tube, bilateral: Secondary | ICD-10-CM | POA: Diagnosis not present

## 2017-07-20 MED ORDER — MECLIZINE HCL 25 MG PO TABS: 25.0000 mg | ORAL_TABLET | Freq: Three times a day (TID) | ORAL | 0 refills | Status: DC | PRN

## 2017-07-20 NOTE — Progress Notes (Signed)
Subjective:    Patient ID: Linda Welch, female    DOB: 1981/04/15, 37 y.o.   MRN: [id number]  HPI  Pt presents to the clinic today with c/o ear pain and dizziness. This started 3 days ago. She reports the ear pain is on the left side. She describes it as a constant ache. She denies decreased hearing. The dizziness she describes as a sensation that the room is spinning. It only occurs with changes in head position, or rolling over in bed. She denies visual changes or near syncope. She has not tried anything OTC for her symptoms.  Review of Systems      Past Medical History:  Diagnosis Date  . Allergy   . Anxiety   . Cancer [facility])    "thyroid cancer - thyroidectomy January 2016"  . Complication of anesthesia    "post operative myalgia"  . Depression   . Difficult intubation    "due to limited neck mobility"  . GERD (gastroesophageal reflux disease)    "not taking Prilosec any longer"  . Hashimoto's thyroiditis 2010  . History of bronchitis   . History of influenza pneumonia 2012  . History of kidney stones    lithotripsy  . Migraine    migraines- usually x1 weekly  . OSA on CPAP    cpap nightly-settings 16  . 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 PCOS (polycystic ovarian syndrome) 2003   takes Metformin for PCOS  . Stress incontinence   . Wears glasses     Current Outpatient Medications  Medication Sig Dispense Refill  . cetirizine (ZYRTEC) 10 MG tablet Take 10 mg by mouth daily.     . Cholecalciferol (VITAMIN D3) 5000 units TABS Take 5,000 Units by mouth daily.    . cyclobenzaprine (FLEXERIL) 10 MG tablet Take 0.5-1 tablets (5-10 mg total) by mouth 2 (two) times daily as needed for muscle spasms (tension headache). 40 tablet 0  . metFORMIN (GLUCOPHAGE-XR) 750 MG 24 hr tablet Take 1 tablet (750 mg total) by mouth at bedtime. 90 tablet 3  . metoprolol tartrate (LOPRESSOR) 50 MG tablet Take 1 tablet (50 mg total) by mouth 2 (two) times daily as  needed (palpitations).    . montelukast (SINGULAIR) 10 MG tablet TAKE ONE TABLET BY MOUTH DAILY 90 tablet 1  . montelukast (SINGULAIR) 10 MG tablet Take 1 tablet (10 mg total) by mouth daily. 90 tablet 3  . nystatin (MYCOSTATIN/NYSTOP) powder Apply topically 4 (four) times daily. 60 g 1  . sertraline (ZOLOFT) 100 MG tablet Take 1 tablet (100 mg total) by mouth daily. 90 tablet 3  . SYNTHROID 175 MCG tablet Take 1 tablet (175 mcg total) by mouth daily. 90 tablet 3  . meclizine (ANTIVERT) 25 MG tablet Take 1 tablet (25 mg total) by mouth 3 (three) times daily as needed for dizziness. 30 tablet 0   No current facility-administered medications for this visit.     Allergies  Allergen Reactions  . Tioconazole Itching, Swelling and Other (See Comments)    Burning (severe)  . Tape Hives and Swelling    Adhesive tapes    Family History  Problem Relation Age of Onset  . Thyroid disease Mother        thyroid goiter  . Depression Mother   . Hyperlipidemia Mother   . Hypertension Mother   . CAD Mother 78       triple bypass  . Hyperlipidemia Father   . Hypertension Father   .  Sleep apnea Father   . CAD Father 32       MI, stent  . Asthma Brother   . Depression Brother   . Heart disease Maternal Grandmother   . Hyperlipidemia Maternal Grandmother   . Heart disease Maternal Grandfather   . Hyperlipidemia Maternal Grandfather   . Heart disease Paternal Grandmother   . Hyperlipidemia Paternal Grandmother   . Heart disease Paternal Grandfather   . Hyperlipidemia Paternal Grandfather   . Diabetes Paternal Grandfather   . Sleep apnea Paternal Grandfather   . Cancer Neg Hx     Social History   Socioeconomic History  . Marital status: Married    Spouse name: Not on file  . Number of children: Not on file  . Years of education: Not on file  . Highest education level: Not on file  Social Needs  . Financial resource strain: Not on file  . Food insecurity - worry: Not on file  . Food  insecurity - inability: Not on file  . Transportation needs - medical: Not on file  . Transportation needs - non-medical: Not on file  Occupational History  . Not on file  Tobacco Use  . Smoking status: Former Smoker    Packs/day: 0.25    Years: 2.00    Pack years: 0.50    Types: Cigarettes    Last attempt to quit: 05/26/1998    Years since quitting: 19.1  . Smokeless tobacco: Never Used  . Tobacco comment: "quit smoking in 2000 - smoked for 2 years"   Substance and Sexual Activity  . Alcohol use: No    Alcohol/week: 0.0 oz  . Drug use: No  . Sexual activity: Yes  Other Topics Concern  . Not on file  Social History Narrative   Divorced    Lives with 2nd husband and 2 boys (2006, 2014)   Occ: florist for [employer]   Edu: some college     Constitutional: Denies fever, malaise, fatigue, headache or abrupt weight changes.  HEENT: Pt reports left ear pain. Denies eye pain, eye redness, ringing in the ears, wax buildup, runny nose, nasal congestion, bloody nose, or sore throat. Neurological: Pt reports dizziness. Denies difficulty with memory, difficulty with speech or problems with balance and coordination.    No other specific complaints in a complete review of systems (except as listed in HPI above).  Objective:   Physical Exam   BP 128/80 (BP Location: Left Arm, Patient Position: Sitting, Cuff Size: Large)   Pulse 95   Temp 98.8 F (37.1 C) (Oral)   Ht 5\' 2"  (1.575 m)   Wt (!) 361 lb 6.4 oz (163.9 kg)   LMP 07/12/2017   SpO2 97%   BMI 66.10 kg/m  Wt Readings from Last 3 Encounters:  07/20/17 (!) 361 lb 6.4 oz (163.9 kg)  07/13/17 (!) 359 lb (162.8 kg)  06/22/17 (!) 365 lb 3.2 oz (165.7 kg)    General: Appears her stated age, well developed, well nourished in NAD. HEENT: Head: normal shape and size; Ears: Tm's gray and intact, normal light reflex, + serous effusion bilaterally;   Cardiovascular: Normal rate and rhythm.  Pulmonary/Chest: Normal effort and  positive vesicular breath sounds. No respiratory distress. No wheezes, rales or ronchi noted.  Neurological: Alert and oriented. Coordination normal.    BMET    Component Value Date/Time   NA 138 01/01/2017 0827   NA 141 04/23/2015 1202   K 4.1 01/01/2017 0827   CL 103 01/01/2017  0827   CO2 29 01/01/2017 0827   GLUCOSE 101 (H) 01/01/2017 0827   BUN 10 01/01/2017 0827   BUN 10 04/23/2015 1202   CREATININE 0.71 01/01/2017 0827   CALCIUM 8.6 01/01/2017 0827   GFRNONAA >60 07/30/2016 1022   GFRAA >60 07/30/2016 1022    Lipid Panel     Component Value Date/Time   CHOL 235 (H) 01/01/2017 0827   TRIG 178.0 (H) 01/01/2017 0827   HDL 40.50 01/01/2017 0827   CHOLHDL 6 01/01/2017 0827   VLDL 35.6 01/01/2017 0827   LDLCALC 159 (H) 01/01/2017 0827    CBC    Component Value Date/Time   WBC 9.0 07/30/2016 1022   RBC 4.63 07/30/2016 1022   HGB 13.0 07/30/2016 1022   HGB 13.3 04/23/2015 1202   HCT 38.5 07/30/2016 1022   HCT 40.1 04/23/2015 1202   PLT 270 07/30/2016 1022   PLT 278 04/23/2015 1202   MCV 83.2 07/30/2016 1022   MCV 88 04/23/2015 1202   MCV 88 10/01/2012 1539   MCH 28.1 07/30/2016 1022   MCHC 33.8 07/30/2016 1022   RDW 15.6 (H) 07/30/2016 1022   RDW 14.5 04/23/2015 1202   RDW 14.1 10/01/2012 1539    Hgb A1C Lab Results  Component Value Date   HGBA1C 5.8 08/13/2015          Assessment & Plan:   ETD, Bilateral, BPPV:  Discussed the Epley Maneuver, watch the youtube video Start Flonase OTC eRx for Meclizine 25 mg TID prn  Return precautions discussed Webb Silversmith, NP

## 2017-07-20 NOTE — Patient Instructions (Signed)

## 2017-12-30 IMAGING — CR DG TIBIA/FIBULA 2V*R*
1 series · 4 of 4 positions shown · non-contrast
Comparison: None.

CLINICAL DATA: Patient status post fall down the stairs. Lower
ankle and lower extremity pain. Initial encounter.

EXAM:
RIGHT TIBIA AND FIBULA - 2 VIEW

[Series 1: dg tibia/fibula right · 0.14mm/px · 4 of 4 slices shown]
[im 1/4]
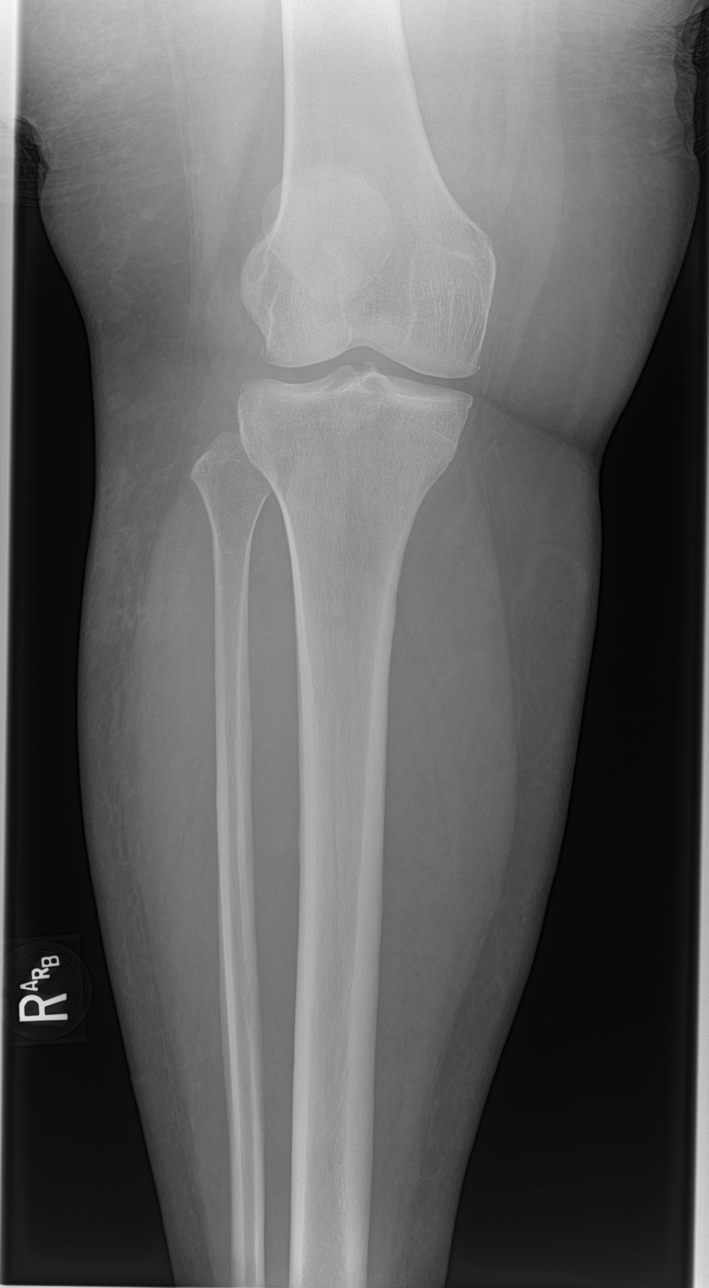
[im 2/4]
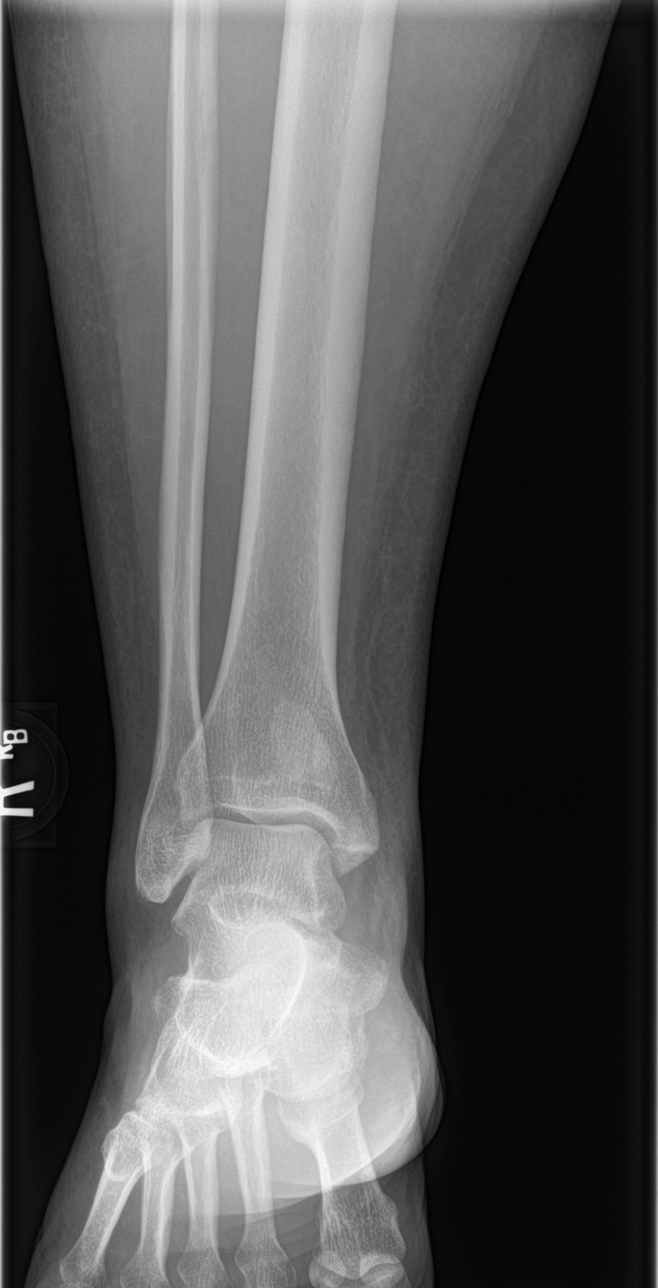
[im 3/4]
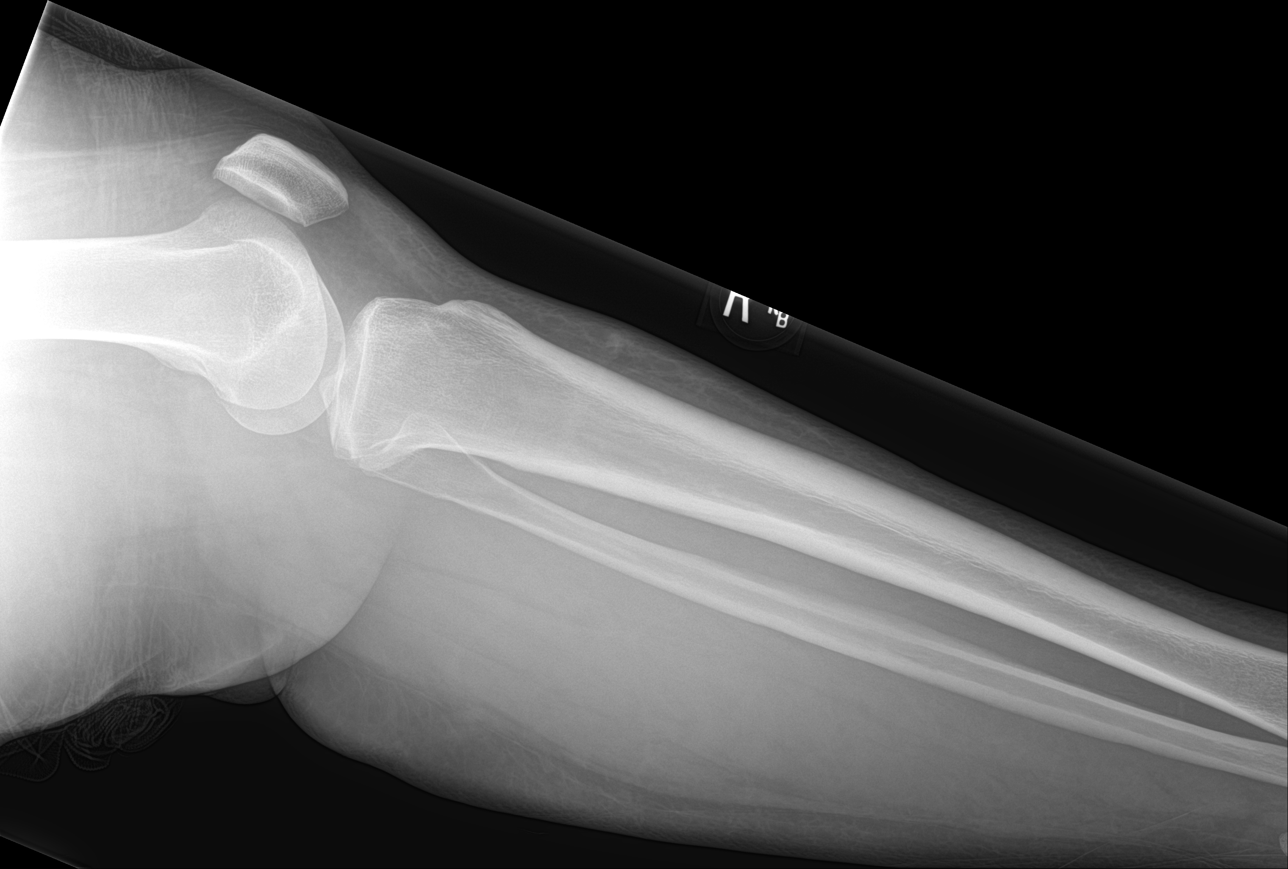
[im 4/4]
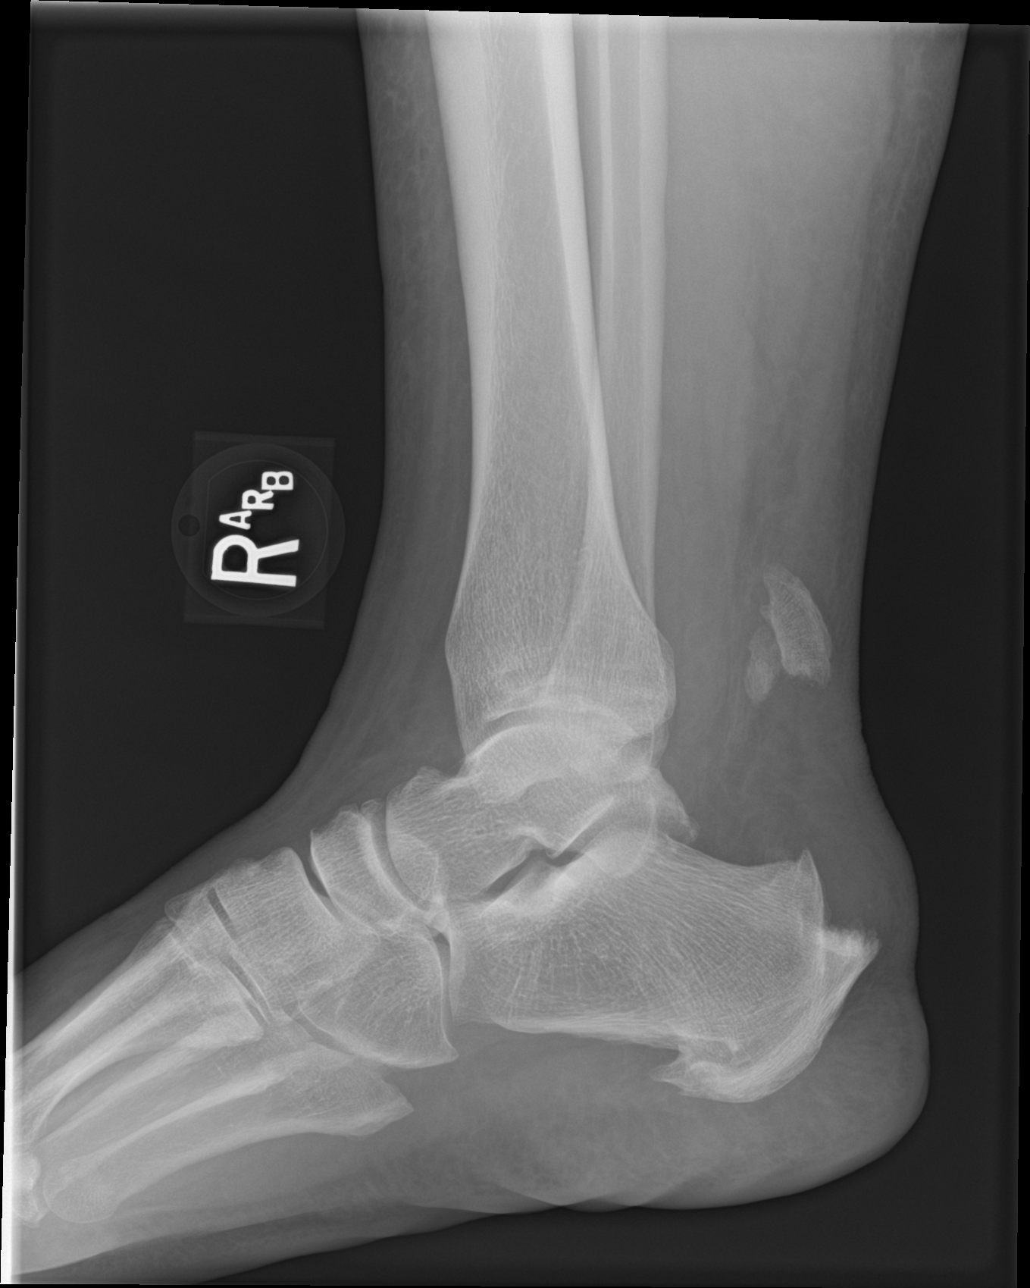

[4 of 4 positions shown; findings below may reference images not displayed]

FINDINGS: Findings compatible with acute avulsion fracture along the posterior
aspect of the calcaneus at the Achilles tendon insertion with
superior displacement of approximately 4.8 cm. Overlying soft tissue
swelling. Plantar calcaneal spurring. Remainder of the tibia and
fibula are intact. Midfoot degenerative changes.
IMPRESSION: Avulsion fracture along the posterior aspect of the calcaneus with
superior displacement of the fracture fragment and Achilles tendon
approximately 4.8 cm.

## 2018-01-01 IMAGING — US US SOFT TISSUE HEAD/NECK
1 series · 14 of 22 positions shown · non-contrast
Comparison: Prior thyroid ultrasound 02/06/2014

CLINICAL DATA: 35-year-old female with a history of papillary
thyroid cancer status post total thyroidectomy.

EXAM:
THYROID ULTRASOUND
TECHNIQUE: Ultrasound examination of the thyroid gland and adjacent soft
tissues was performed.

[Series 1: us soft tissue head/neck · 0.08mm/px · 14 of 22 slices shown]
[im 1/22]
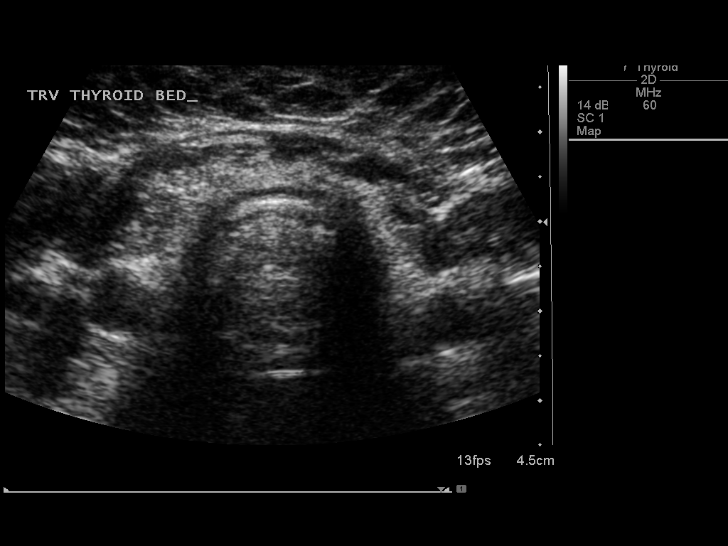
[im 3/22]
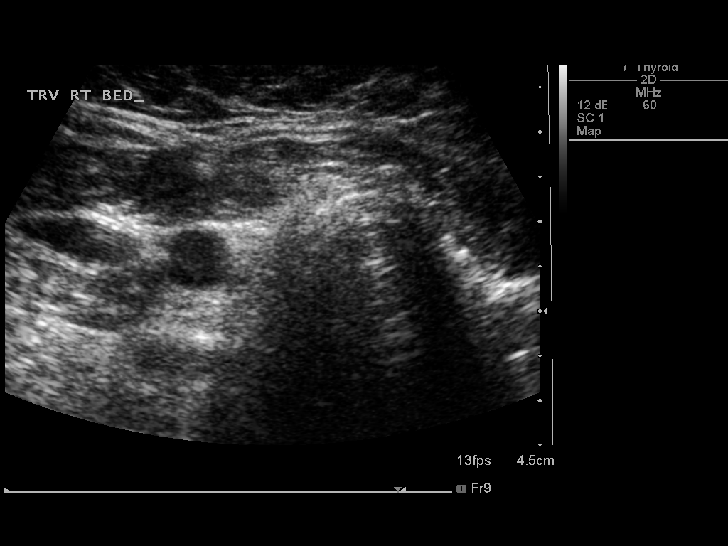
[im 4/22]
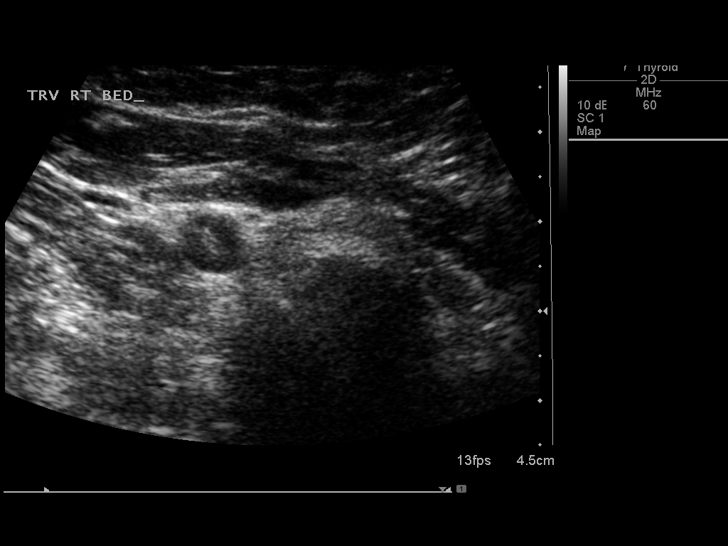
[im 6/22]
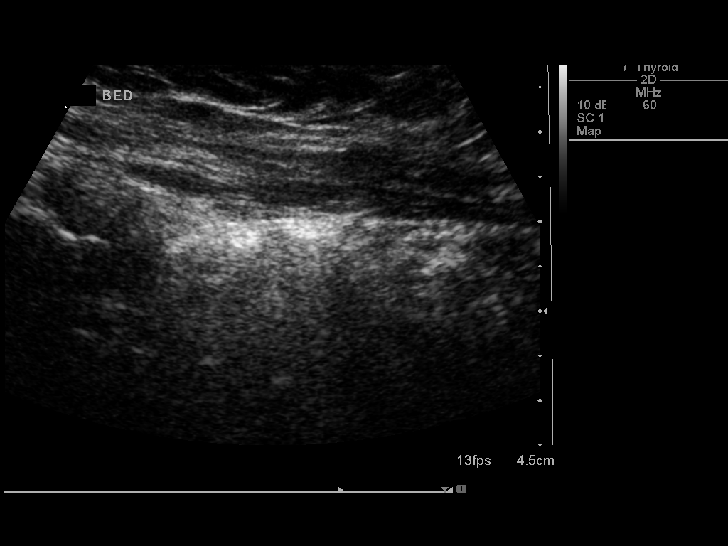
[im 8/22]
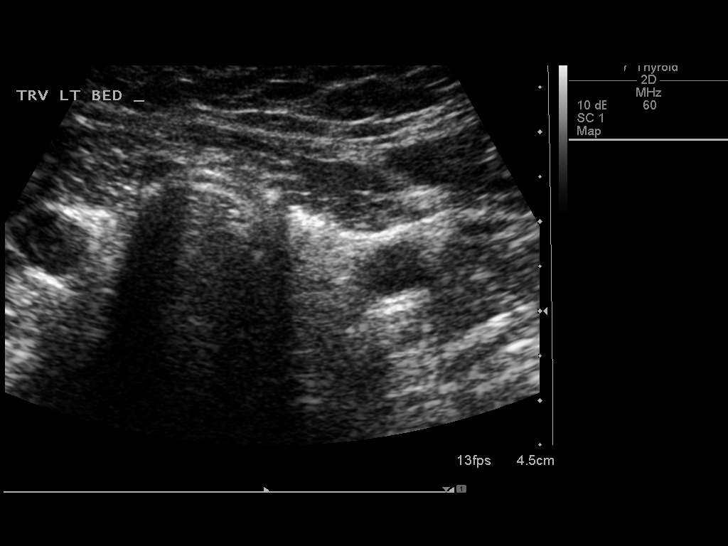
[im 9/22]
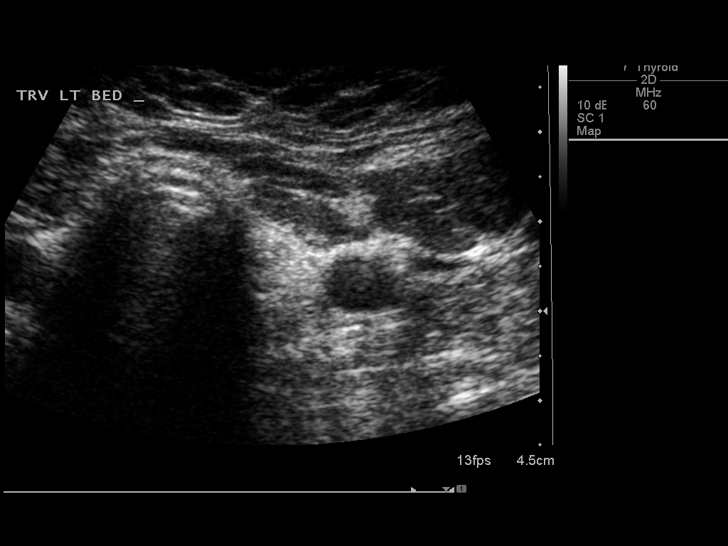
[im 11/22]
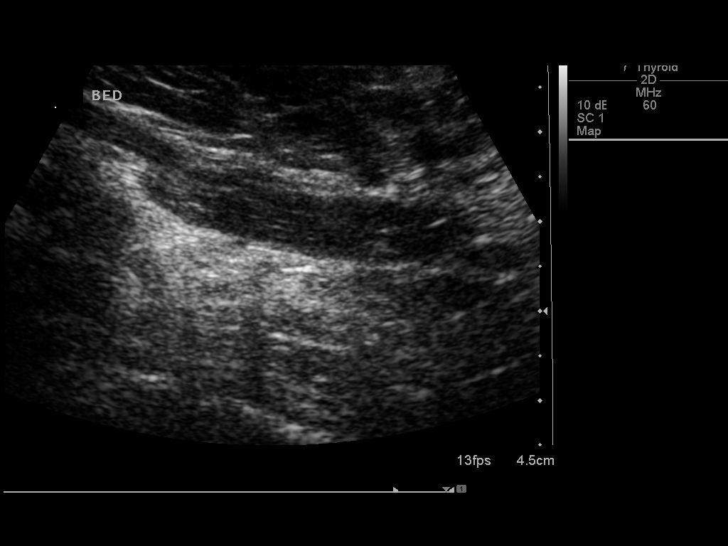
[im 12/22]
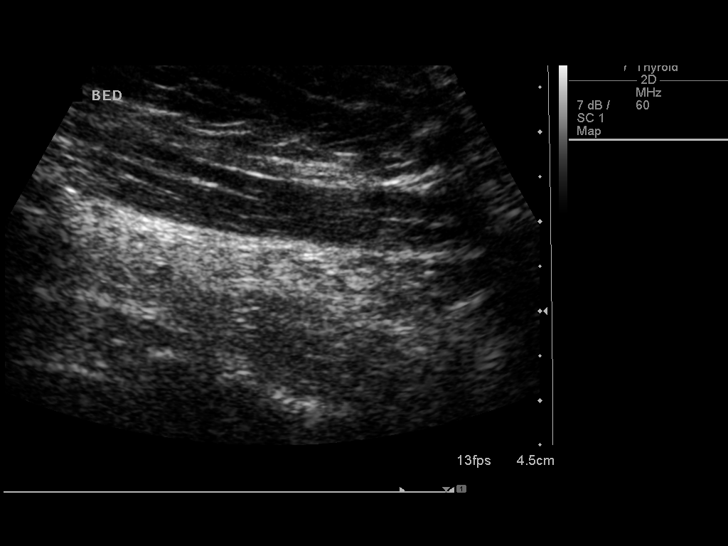
[im 14/22]
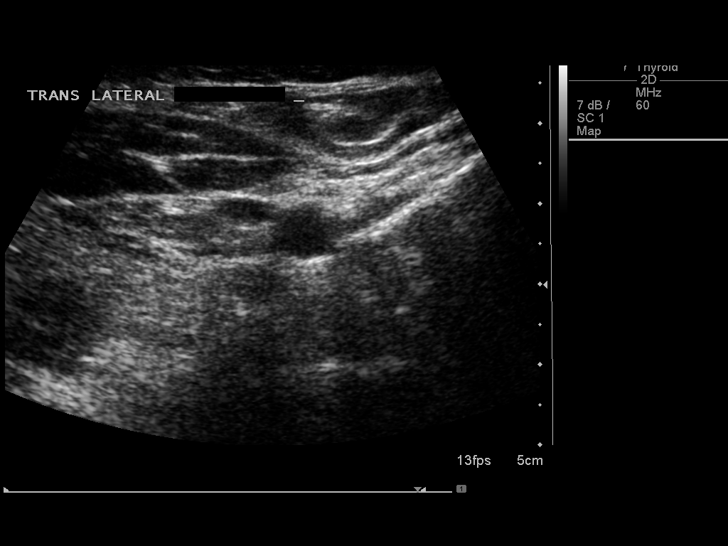
[im 15/22]
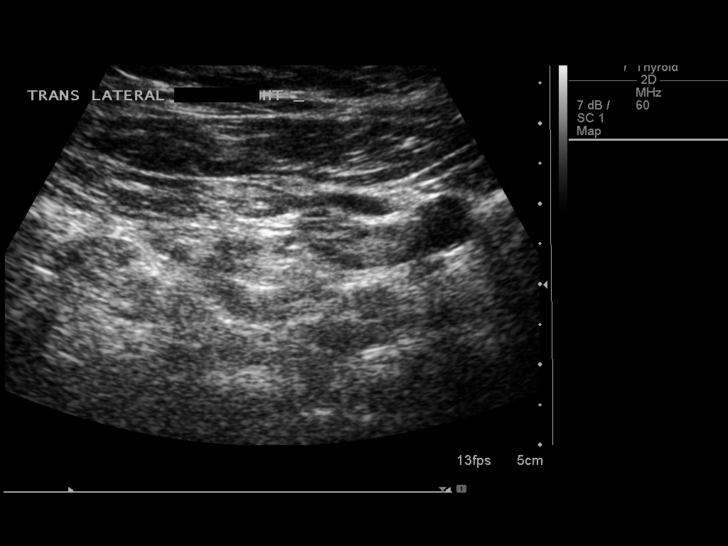
[im 17/22]
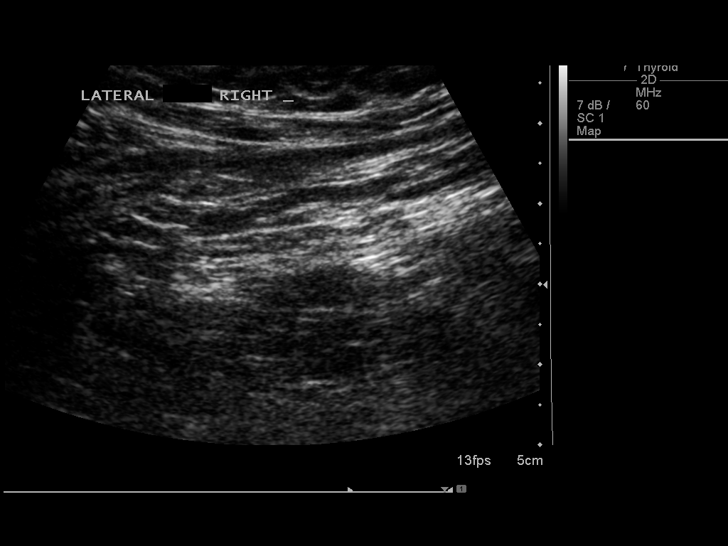
[im 19/22]
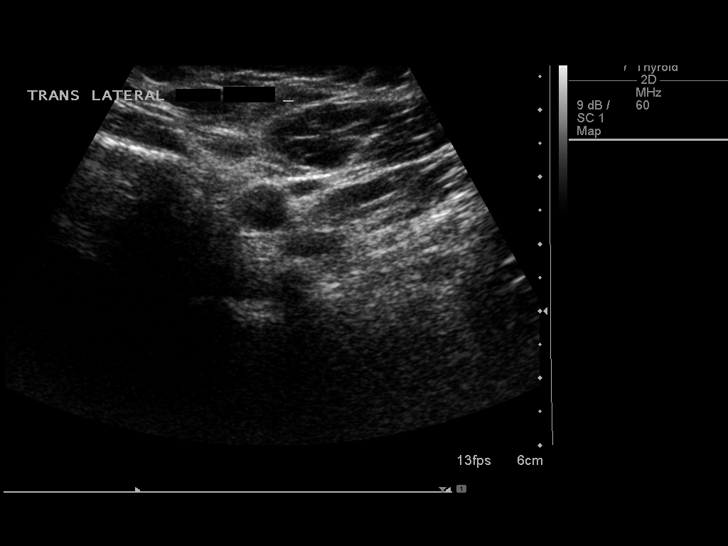
[im 20/22]
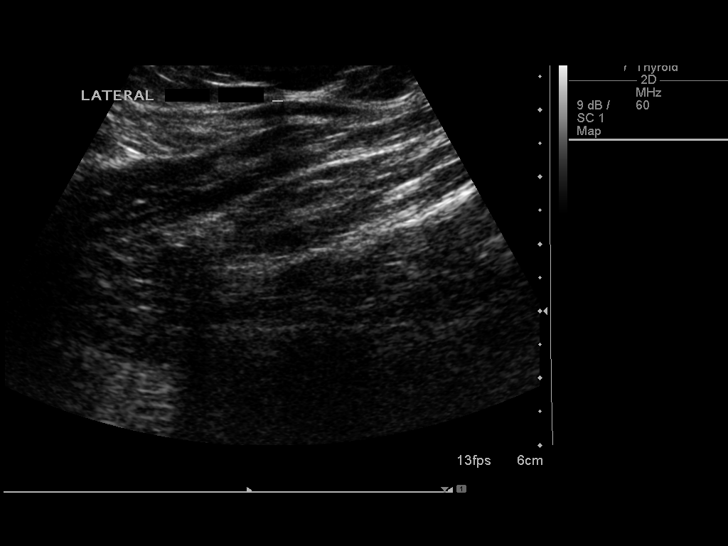
[im 22/22]
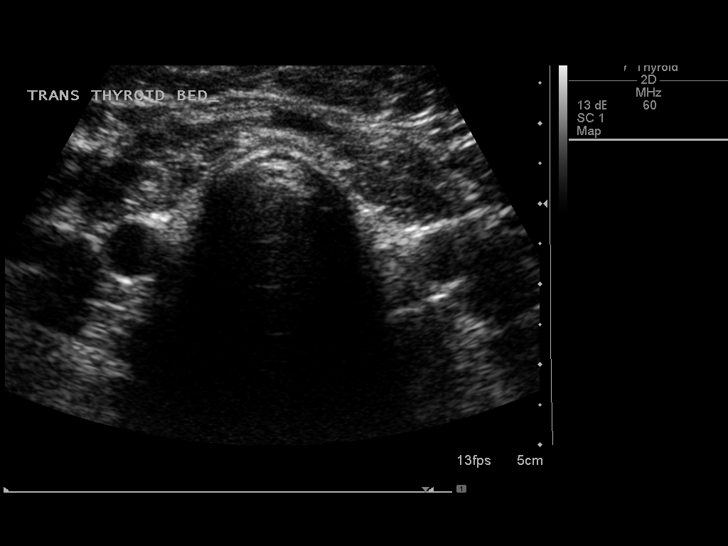

[14 of 22 positions shown; findings below may reference images not displayed]

FINDINGS: Right thyroid lobe

Surgically absent. No evidence of residual or recurrent thyroid
tissue.

Left thyroid lobe

Surgically absent. No evidence of residual or recurrent thyroid
tissue.

Lymphadenopathy

None visualized.
IMPRESSION: Surgical changes of total thyroidectomy without evidence of residual
thyroid tissue, nodularity or lymphadenopathy.

## 2018-01-10 ENCOUNTER — Other Ambulatory Visit: Payer: Self-pay | Admitting: Family Medicine

## 2018-01-10 DIAGNOSIS — E282 Polycystic ovarian syndrome: Secondary | ICD-10-CM

## 2018-01-10 DIAGNOSIS — Z148 Genetic carrier of other disease: Secondary | ICD-10-CM

## 2018-01-10 DIAGNOSIS — E559 Vitamin D deficiency, unspecified: Secondary | ICD-10-CM

## 2018-01-10 DIAGNOSIS — E890 Postprocedural hypothyroidism: Secondary | ICD-10-CM

## 2018-01-10 DIAGNOSIS — E785 Hyperlipidemia, unspecified: Secondary | ICD-10-CM

## 2018-01-11 ENCOUNTER — Other Ambulatory Visit (INDEPENDENT_AMBULATORY_CARE_PROVIDER_SITE_OTHER): Payer: Managed Care, Other (non HMO)

## 2018-01-11 DIAGNOSIS — Z148 Genetic carrier of other disease: Secondary | ICD-10-CM | POA: Diagnosis not present

## 2018-01-11 DIAGNOSIS — E282 Polycystic ovarian syndrome: Secondary | ICD-10-CM

## 2018-01-11 DIAGNOSIS — E559 Vitamin D deficiency, unspecified: Secondary | ICD-10-CM

## 2018-01-11 DIAGNOSIS — E890 Postprocedural hypothyroidism: Secondary | ICD-10-CM | POA: Diagnosis not present

## 2018-01-11 DIAGNOSIS — E785 Hyperlipidemia, unspecified: Secondary | ICD-10-CM

## 2018-01-11 LAB — LIPID PANEL
Cholesterol: 246 mg/dL — ABNORMAL HIGH (ref 0–200)
HDL: 50 mg/dL (ref >39.00)
LDL Cholesterol: 165 mg/dL — ABNORMAL HIGH (ref 0–99)
NONHDL: 195.53
Total CHOL/HDL Ratio: 5
Triglycerides: 151 mg/dL — ABNORMAL HIGH (ref 0.0–149.0)
VLDL: 30.2 mg/dL (ref 0.0–40.0)

## 2018-01-11 LAB — CBC WITH DIFFERENTIAL/PLATELET
BASOS ABS: 0 10*3/uL (ref 0.0–0.1)
Basophils Relative: 0.3 % (ref 0.0–3.0)
Eosinophils Absolute: 0.1 10*3/uL (ref 0.0–0.7)
Eosinophils Relative: 1.3 % (ref 0.0–5.0)
HEMATOCRIT: 39.9 % (ref 36.0–46.0)
HEMOGLOBIN: 13.3 g/dL (ref 12.0–15.0)
Lymphocytes Relative: 33.4 % (ref 12.0–46.0)
Lymphs Abs: 2.4 10*3/uL (ref 0.7–4.0)
MCHC: 33.3 g/dL (ref 30.0–36.0)
MCV: 88.3 fl (ref 78.0–100.0)
MONOS PCT: 6.2 % (ref 3.0–12.0)
Monocytes Absolute: 0.4 10*3/uL (ref 0.1–1.0)
Neutro Abs: 4.2 10*3/uL (ref 1.4–7.7)
Neutrophils Relative %: 58.8 % (ref 43.0–77.0)
Platelets: 255 10*3/uL (ref 150.0–400.0)
RBC: 4.52 Mil/uL (ref 3.87–5.11)
RDW: 15.5 % (ref 11.5–15.5)
WBC: 7.1 10*3/uL (ref 4.0–10.5)

## 2018-01-11 LAB — COMPREHENSIVE METABOLIC PANEL
ALT: 17 U/L (ref 0–35)
AST: 17 U/L (ref 0–37)
Albumin: 4.1 g/dL (ref 3.5–5.2)
Alkaline Phosphatase: 84 U/L (ref 39–117)
BILIRUBIN TOTAL: 0.3 mg/dL (ref 0.2–1.2)
BUN: 14 mg/dL (ref 6–23)
CALCIUM: 9 mg/dL (ref 8.4–10.5)
CO2: 28 mEq/L (ref 19–32)
Chloride: 103 mEq/L (ref 96–112)
Creatinine, Ser: 0.74 mg/dL (ref 0.40–1.20)
GFR: 92.8 mL/min (ref >60.00)
Glucose, Bld: 109 mg/dL — ABNORMAL HIGH (ref 70–99)
Potassium: 4.3 mEq/L (ref 3.5–5.1)
Sodium: 138 mEq/L (ref 135–145)
TOTAL PROTEIN: 7.4 g/dL (ref 6.0–8.3)

## 2018-01-11 LAB — VITAMIN D 25 HYDROXY (VIT D DEFICIENCY, FRACTURES): VITD: 32.43 ng/mL (ref 30.00–100.00)

## 2018-01-11 LAB — TSH: TSH: 4.45 u[IU]/mL (ref 0.35–4.50)

## 2018-01-11 LAB — T4, FREE: Free T4: 0.75 ng/dL (ref 0.60–1.60)

## 2018-01-18 ENCOUNTER — Encounter: Payer: Managed Care, Other (non HMO) | Admitting: Family Medicine

## 2018-04-07 ENCOUNTER — Encounter: Payer: Self-pay | Admitting: Family Medicine

## 2018-04-07 ENCOUNTER — Ambulatory Visit (INDEPENDENT_AMBULATORY_CARE_PROVIDER_SITE_OTHER): Payer: Managed Care, Other (non HMO) | Admitting: Family Medicine

## 2018-04-07 VITALS — BP 122/78 | HR 93 | Temp 98.4°F | Ht 62.0 in | Wt 358.0 lb

## 2018-04-07 DIAGNOSIS — Z0000 Encounter for general adult medical examination without abnormal findings: Secondary | ICD-10-CM | POA: Diagnosis not present

## 2018-04-07 DIAGNOSIS — Z6841 Body Mass Index (BMI) 40.0 and over, adult: Secondary | ICD-10-CM

## 2018-04-07 DIAGNOSIS — Z6379 Other stressful life events affecting family and household: Secondary | ICD-10-CM

## 2018-04-07 DIAGNOSIS — R739 Hyperglycemia, unspecified: Secondary | ICD-10-CM

## 2018-04-07 DIAGNOSIS — E890 Postprocedural hypothyroidism: Secondary | ICD-10-CM

## 2018-04-07 DIAGNOSIS — R7303 Prediabetes: Secondary | ICD-10-CM | POA: Insufficient documentation

## 2018-04-07 DIAGNOSIS — F331 Major depressive disorder, recurrent, moderate: Secondary | ICD-10-CM

## 2018-04-07 DIAGNOSIS — E559 Vitamin D deficiency, unspecified: Secondary | ICD-10-CM

## 2018-04-07 DIAGNOSIS — J309 Allergic rhinitis, unspecified: Secondary | ICD-10-CM

## 2018-04-07 DIAGNOSIS — E785 Hyperlipidemia, unspecified: Secondary | ICD-10-CM

## 2018-04-07 DIAGNOSIS — E282 Polycystic ovarian syndrome: Secondary | ICD-10-CM

## 2018-04-07 DIAGNOSIS — E1169 Type 2 diabetes mellitus with other specified complication: Secondary | ICD-10-CM | POA: Insufficient documentation

## 2018-04-07 LAB — POCT GLYCOSYLATED HEMOGLOBIN (HGB A1C): Hemoglobin A1C: 5.4 % (ref 4.0–5.6)

## 2018-04-07 MED ORDER — SERTRALINE HCL 100 MG PO TABS: 100.0000 mg | ORAL_TABLET | Freq: Every day | ORAL | 3 refills | Status: DC

## 2018-04-07 MED ORDER — NYSTATIN 100000 UNIT/GM EX POWD: Freq: Four times a day (QID) | CUTANEOUS | 1 refills | Status: DC

## 2018-04-07 MED ORDER — BUSPIRONE HCL 5 MG PO TABS: 5.0000 mg | ORAL_TABLET | Freq: Two times a day (BID) | ORAL | 3 refills | Status: DC

## 2018-04-07 MED ORDER — FLUTICASONE PROPIONATE 50 MCG/ACT NA SUSP: 2.0000 | Freq: Every day | NASAL | Status: DC

## 2018-04-07 NOTE — Assessment & Plan Note (Addendum)
Continue metformin. Continue f/u with endo.

## 2018-04-07 NOTE — Assessment & Plan Note (Addendum)
Check A1c - reassuring. Endorses h/o insulin resistance.

## 2018-04-07 NOTE — Assessment & Plan Note (Addendum)
Stable period. zoloft 100mg  helps. See below for further plan.

## 2018-04-07 NOTE — Assessment & Plan Note (Signed)
TSH borderline elevated however she has difficulty tolerating levothyroxine dose changes - she will f/u with endo 05/2018.

## 2018-04-07 NOTE — Assessment & Plan Note (Signed)
Husband and mother have had significant medical issues this past year. Discussed importance of self care to prevent caregiver burnout. Worsening anxiety reflected by GAD7 score of 17/21. Pt not interested in counseling. Will trial buspar anti anxiety medication 5mg  BID. Discussed mechanism of action as well as possible side effects.

## 2018-04-07 NOTE — Assessment & Plan Note (Addendum)
Maintaining levels on 5000 IU daily.

## 2018-04-07 NOTE — Assessment & Plan Note (Signed)
Continues daily singulair, cetirizine, and PRN flonase.

## 2018-04-07 NOTE — Assessment & Plan Note (Signed)
Encouraged ongoing efforts at healthy diet and lifestyle chagnes for sustainable weight loss. Briefly mentioned bariatric medication. She is not good saxenda candidate due to personal h/o thyroid cancer. Would consider contrave vs belviq.

## 2018-04-07 NOTE — Progress Notes (Signed)
BP 122/78 (BP Location: Left Arm, Patient Position: Sitting, Cuff Size: Large)   Pulse 93   Temp 98.4 F (36.9 C) (Oral)   Ht 5\' 2"  (1.575 m)   Wt (!) 358 lb (162.4 kg)   LMP 03/27/2018   SpO2 96%   BMI 65.48 kg/m    CC: CPE  Subjective:    Patient ID: Linda Welch, female    DOB: [birth year], 37 y.o.   MRN: [id number]  HPI: Linda Welch is a 37 y.o. female presenting on 04/07/2018 for Annual Exam; Back Pain (C/o left side low back pain. Pain feels like stabbing pain and occurs when standing. Started about 5 wks ago. ); Leg Pain (C/o pain in posterior left leg behind the knee. Pain is burning and constant. ); and Abdominal Pain (C/o abd pain in epigastric area for some wks. Pain is becoming more frequent. )   Pt will return for separate visit for above concerns.  Husband type 1 diabetic, struggling with health problems this year.  Cardiologist - Dr Ubaldo Glassing Endocrinologist - Dr Cruzita Lederer (seeing once yearly) for PCOS and h/o thyroid cancer s/p thyroidectomy.  Preventative: GYN - Dr Kenton Kingfisher at [facility]. All normal pap smears in the past. Due for f/u.  Flu shot yearly Tdap 2013, 2014  Prevnar 2015 Seat belt use discussed.  Sunscreen use discussed. Changing mole on left shoulder. fmhx skin cancer.  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 Interim medical history since our last visit reviewed. Allergies and medications reviewed and updated. Outpatient Medications Prior to Visit  Medication Sig Dispense Refill  . cetirizine (ZYRTEC) 10 MG tablet Take 10 mg by mouth daily.     . Cholecalciferol (VITAMIN D3) 5000 units TABS  Take 5,000 Units by mouth daily.    . metFORMIN (GLUCOPHAGE-XR) 750 MG 24 hr tablet Take 1 tablet (750 mg total) by mouth at bedtime. 90 tablet 3  . metoprolol succinate (TOPROL-XL) 100 MG 24 hr tablet Take 100 mg by mouth daily. Take with or immediately following a meal.    . montelukast (SINGULAIR) 10 MG tablet Take 1 tablet (10 mg total) by mouth daily. 90 tablet 3  . SYNTHROID 175 MCG tablet Take 1 tablet (175 mcg total) by mouth daily. 90 tablet 3  . nystatin (MYCOSTATIN/NYSTOP) powder Apply topically 4 (four) times daily. 60 g 1  . sertraline (ZOLOFT) 100 MG tablet Take 1 tablet (100 mg total) by mouth daily. 90 tablet 3  . montelukast (SINGULAIR) 10 MG tablet TAKE ONE TABLET BY MOUTH DAILY 90 tablet 1  . cyclobenzaprine (FLEXERIL) 10 MG tablet Take 0.5-1 tablets (5-10 mg total) by mouth 2 (two) times daily as needed for muscle spasms (tension headache). 40 tablet 0  . meclizine (ANTIVERT) 25 MG tablet Take 1 tablet (25 mg total) by mouth 3 (three) times daily as needed for dizziness. 30 tablet 0  . metoprolol tartrate (LOPRESSOR) 50 MG tablet Take 1 tablet (50 mg total) by mouth 2 (two) times daily as needed (palpitations).     No facility-administered medications prior to visit.      Per HPI unless specifically indicated in ROS section  below Review of Systems  Constitutional: Negative for activity change, appetite change, chills, fatigue, fever and unexpected weight change.  HENT: Negative for hearing loss.   Eyes: Negative for visual disturbance.  Respiratory: Negative for cough, chest tightness, shortness of breath and wheezing.   Cardiovascular: Positive for chest pain (has been seeing her cardiologist - normal stress test) and palpitations. Negative for leg swelling.  Gastrointestinal: Positive for abdominal pain (pressure discomfort) and diarrhea. Negative for abdominal distention, blood in stool, constipation, nausea and vomiting.  Genitourinary: Negative for difficulty urinating  and hematuria.  Musculoskeletal: Negative for arthralgias, myalgias and neck pain.  Skin: Negative for rash.  Neurological: Positive for headaches (tension, migraines). Negative for dizziness, seizures and syncope.  Hematological: Negative for adenopathy. Does not bruise/bleed easily.  Psychiatric/Behavioral: Negative for dysphoric mood. The patient is not nervous/anxious.        Objective:    BP 122/78 (BP Location: Left Arm, Patient Position: Sitting, Cuff Size: Large)   Pulse 93   Temp 98.4 F (36.9 C) (Oral)   Ht 5\' 2"  (1.575 m)   Wt (!) 358 lb (162.4 kg)   LMP 03/27/2018   SpO2 96%   BMI 65.48 kg/m   Wt Readings from Last 3 Encounters:  04/07/18 (!) 358 lb (162.4 kg)  07/20/17 (!) 361 lb 6.4 oz (163.9 kg)  07/13/17 (!) 359 lb (162.8 kg)    Physical Exam  Constitutional: She is oriented to person, place, and time. She appears well-developed and well-nourished. No distress.  HENT:  Head: Normocephalic and atraumatic.  Right Ear: Hearing, tympanic membrane, external ear and ear canal normal.  Left Ear: Hearing, tympanic membrane, external ear and ear canal normal.  Nose: Nose normal.  Mouth/Throat: Uvula is midline, oropharynx is clear and moist and mucous membranes are normal. No oropharyngeal exudate, posterior oropharyngeal edema or posterior oropharyngeal erythema.  Eyes: Pupils are equal, round, and reactive to light. Conjunctivae and EOM are normal. No scleral icterus.  Neck: Normal range of motion. Neck supple. No thyromegaly present.  Cardiovascular: Normal rate, regular rhythm, normal heart sounds and intact distal pulses.  No murmur heard. Pulses:      Radial pulses are 2+ on the right side, and 2+ on the left side.  Pulmonary/Chest: Effort normal and breath sounds normal. No respiratory distress. She has no wheezes. She has no rales.  Abdominal: Soft. Bowel sounds are normal. She exhibits no distension and no mass. There is no tenderness. There is no rebound and  no guarding.  Musculoskeletal: Normal range of motion. She exhibits no edema.  Lymphadenopathy:    She has no cervical adenopathy.  Neurological: She is alert and oriented to person, place, and time.  CN grossly intact, station and gait intact  Skin: Skin is warm and dry. No rash noted.  Psychiatric: She has a normal mood and affect. Her behavior is normal. Judgment and thought content normal.  Nursing note and vitals reviewed.  Results for orders placed or performed in visit on 04/07/18  POCT glycosylated hemoglobin (Hb A1C)  Result Value Ref Range   Hemoglobin A1C 5.4 4.0 - 5.6 %   HbA1c POC (<> result, manual entry)     HbA1c, POC (prediabetic range)     HbA1c, POC (controlled diabetic range)     Depression screen [facility] 2/9 04/07/2018 04/07/2018 02/15/2016  Decreased Interest 0 0 0  Down, Depressed, Hopeless 1 - 0  PHQ - 2 Score 1 0 0  Altered sleeping 2 - -  Tired,  decreased energy 3 - -  Change in appetite 1 - -  Feeling bad or failure about yourself  0 - -  Trouble concentrating 3 - -  Moving slowly or fidgety/restless 1 - -  Suicidal thoughts 0 - -  PHQ-9 Score 11 - -    GAD 7 : Generalized Anxiety Score 04/07/2018  Nervous, Anxious, on Edge 3  Control/stop worrying 2  Worry too much - different things 3  Trouble relaxing 2  Restless 2  Easily annoyed or irritable 2  Afraid - awful might happen 3  Total GAD 7 Score 17        Assessment & Plan:   Problem List Items Addressed This Visit    Vitamin D deficiency    Maintaining levels on 5000 IU daily.      Stress due to illness of family member    Husband and mother have had significant medical issues this past year. Discussed importance of self care to prevent caregiver burnout. Worsening anxiety reflected by GAD7 score of 17/21. Pt not interested in counseling. Will trial buspar anti anxiety medication 5mg  BID. Discussed mechanism of action as well as possible side effects.       Postsurgical hypothyroidism     TSH borderline elevated however she has difficulty tolerating levothyroxine dose changes - she will f/u with endo 05/2018.      Relevant Medications   metoprolol succinate (TOPROL-XL) 100 MG 24 hr tablet   PCOS (polycystic ovarian syndrome)    Continue metformin. Continue f/u with endo.       MDD (major depressive disorder), recurrent episode, moderate (HCC)    Stable period. zoloft 100mg  helps. See below for further plan.       Relevant Medications   sertraline (ZOLOFT) 100 MG tablet   busPIRone (BUSPAR) 5 MG tablet   Hyperlipidemia    Chronically elevated LDL off medication. +fmhx. Consider Lipoprotein (a) measurement next labs for further risk assessment.  The ASCVD Risk score Mikey Bussing DC Jr., et al., 2013) failed to calculate for the following reasons:   The 2013 ASCVD risk score is only valid for ages 1 to 71       Relevant Medications   metoprolol succinate (TOPROL-XL) 100 MG 24 hr tablet   Hyperglycemia    Check A1c - reassuring. Endorses h/o insulin resistance.       Relevant Orders   POCT glycosylated hemoglobin (Hb A1C) (Completed)   Health maintenance examination - Primary    Preventative protocols reviewed and updated unless pt declined. Discussed healthy diet and lifestyle.       Chronic allergic rhinitis    Continues daily singulair, cetirizine, and PRN flonase.       BMI 60.0-69.9, adult ([place])    Encouraged ongoing efforts at healthy diet and lifestyle chagnes for sustainable weight loss. Briefly mentioned bariatric medication. She is not good saxenda candidate due to personal h/o thyroid cancer. Would consider contrave vs belviq.           Meds ordered this encounter  Medications  . sertraline (ZOLOFT) 100 MG tablet    Sig: Take 1 tablet (100 mg total) by mouth daily.    Dispense:  90 tablet    Refill:  3  . nystatin (MYCOSTATIN/NYSTOP) powder    Sig: Apply topically 4 (four) times daily.    Dispense:  60 g    Refill:  1  . fluticasone (FLONASE) 50  MCG/ACT nasal spray    Sig: Place 2 sprays into both  nostrils daily.  . busPIRone (BUSPAR) 5 MG tablet    Sig: Take 1 tablet (5 mg total) by mouth 2 (two) times daily.    Dispense:  60 tablet    Refill:  3   Orders Placed This Encounter  Procedures  . POCT glycosylated hemoglobin (Hb A1C)    Follow up plan: Return in about 1 year (around 04/08/2019) for annual exam, prior fasting for blood work.  Ria Bush, MD

## 2018-04-07 NOTE — Assessment & Plan Note (Signed)
Preventative protocols reviewed and updated unless pt declined. Discussed healthy diet and lifestyle.  

## 2018-04-07 NOTE — Assessment & Plan Note (Signed)
Chronically elevated LDL off medication. +fmhx. Consider Lipoprotein (a) measurement next labs for further risk assessment.  The ASCVD Risk score Mikey Bussing DC Jr., et al., 2013) failed to calculate for the following reasons:   The 2013 ASCVD risk score is only valid for ages 62 to 72

## 2018-04-07 NOTE — Patient Instructions (Addendum)
Continue zoloft '100mg'$ , may add buspar '5mg'$  nightly for 4 days then twice daily (for anxiety).  Fingerstick A1c today.  Return as needed or in 1 year for next physical.   Health Maintenance, Female Adopting a healthy lifestyle and getting preventive care can go a long way to promote health and wellness. Talk with your health care provider about what schedule of regular examinations is right for you. This is a good chance for you to check in with your provider about disease prevention and staying healthy. In between checkups, there are plenty of things you can do on your own. Experts have done a lot of research about which lifestyle changes and preventive measures are most likely to keep you healthy. Ask your health care provider for more information. Weight and diet Eat a healthy diet  Be sure to include plenty of vegetables, fruits, low-fat dairy products, and lean protein.  Do not eat a lot of foods high in solid fats, added sugars, or salt.  Get regular exercise. This is one of the most important things you can do for your health. ? Most adults should exercise for at least 150 minutes each week. The exercise should increase your heart rate and make you sweat (moderate-intensity exercise). ? Most adults should also do strengthening exercises at least twice a week. This is in addition to the moderate-intensity exercise.  Maintain a healthy weight  Body mass index (BMI) is a measurement that can be used to identify possible weight problems. It estimates body fat based on height and weight. Your health care provider can help determine your BMI and help you achieve or maintain a healthy weight.  For females 11 years of age and older: ? A BMI below 18.5 is considered underweight. ? A BMI of 18.5 to 24.9 is normal. ? A BMI of 25 to 29.9 is considered overweight. ? A BMI of 30 and above is considered obese.  Watch levels of cholesterol and blood lipids  You should start having your blood  tested for lipids and cholesterol at 39 years of age, then have this test every 5 years.  You may need to have your cholesterol levels checked more often if: ? Your lipid or cholesterol levels are high. ? You are older than 39 years of age. ? You are at high risk for heart disease.  Cancer screening Lung Cancer  Lung cancer screening is recommended for adults 42-44 years old who are at high risk for lung cancer because of a history of smoking.  A yearly low-dose CT scan of the lungs is recommended for people who: ? Currently smoke. ? Have quit within the past 15 years. ? Have at least a 30-pack-year history of smoking. A pack year is smoking an average of one pack of cigarettes a day for 1 year.  Yearly screening should continue until it has been 15 years since you quit.  Yearly screening should stop if you develop a health problem that would prevent you from having lung cancer treatment.  Breast Cancer  Practice breast self-awareness. This means understanding how your breasts normally appear and feel.  It also means doing regular breast self-exams. Let your health care provider know about any changes, no matter how small.  If you are in your 20s or 30s, you should have a clinical breast exam (CBE) by a health care provider every 1-3 years as part of a regular health exam.  If you are 65 or older, have a CBE every year. Also consider  having a breast X-ray (mammogram) every year.  If you have a family history of breast cancer, talk to your health care provider about genetic screening.  If you are at high risk for breast cancer, talk to your health care provider about having an MRI and a mammogram every year.  Breast cancer gene (BRCA) assessment is recommended for women who have family members with BRCA-related cancers. BRCA-related cancers include: ? Breast. ? Ovarian. ? Tubal. ? Peritoneal cancers.  Results of the assessment will determine the need for genetic counseling and  BRCA1 and BRCA2 testing.  Cervical Cancer Your health care provider may recommend that you be screened regularly for cancer of the pelvic organs (ovaries, uterus, and vagina). This screening involves a pelvic examination, including checking for microscopic changes to the surface of your cervix (Pap test). You may be encouraged to have this screening done every 3 years, beginning at age 33.  For women ages 82-65, health care providers may recommend pelvic exams and Pap testing every 3 years, or they may recommend the Pap and pelvic exam, combined with testing for human papilloma virus (HPV), every 5 years. Some types of HPV increase your risk of cervical cancer. Testing for HPV may also be done on women of any age with unclear Pap test results.  Other health care providers may not recommend any screening for nonpregnant women who are considered low risk for pelvic cancer and who do not have symptoms. Ask your health care provider if a screening pelvic exam is right for you.  If you have had past treatment for cervical cancer or a condition that could lead to cancer, you need Pap tests and screening for cancer for at least 20 years after your treatment. If Pap tests have been discontinued, your risk factors (such as having a new sexual partner) need to be reassessed to determine if screening should resume. Some women have medical problems that increase the chance of getting cervical cancer. In these cases, your health care provider may recommend more frequent screening and Pap tests.  Colorectal Cancer  This type of cancer can be detected and often prevented.  Routine colorectal cancer screening usually begins at 39 years of age and continues through 39 years of age.  Your health care provider may recommend screening at an earlier age if you have risk factors for colon cancer.  Your health care provider may also recommend using home test kits to check for hidden blood in the stool.  A small camera  at the end of a tube can be used to examine your colon directly (sigmoidoscopy or colonoscopy). This is done to check for the earliest forms of colorectal cancer.  Routine screening usually begins at age 61.  Direct examination of the colon should be repeated every 5-10 years through 39 years of age. However, you may need to be screened more often if early forms of precancerous polyps or small growths are found.  Skin Cancer  Check your skin from head to toe regularly.  Tell your health care provider about any new moles or changes in moles, especially if there is a change in a mole's shape or color.  Also tell your health care provider if you have a mole that is larger than the size of a pencil eraser.  Always use sunscreen. Apply sunscreen liberally and repeatedly throughout the day.  Protect yourself by wearing long sleeves, pants, a wide-brimmed hat, and sunglasses whenever you are outside.  Heart disease, diabetes, and high blood pressure  High blood pressure causes heart disease and increases the risk of stroke. High blood pressure is more likely to develop in: ? People who have blood pressure in the high end of the normal range (130-139/85-89 mm Hg). ? People who are overweight or obese. ? People who are African American.  If you are 32-81 years of age, have your blood pressure checked every 3-5 years. If you are 37 years of age or older, have your blood pressure checked every year. You should have your blood pressure measured twice-once when you are at a hospital or clinic, and once when you are not at a hospital or clinic. Record the average of the two measurements. To check your blood pressure when you are not at a hospital or clinic, you can use: ? An automated blood pressure machine at a pharmacy. ? A home blood pressure monitor.  If you are between 27 years and 26 years old, ask your health care provider if you should take aspirin to prevent strokes.  Have regular diabetes  screenings. This involves taking a blood sample to check your fasting blood sugar level. ? If you are at a normal weight and have a low risk for diabetes, have this test once every three years after 39 years of age. ? If you are overweight and have a high risk for diabetes, consider being tested at a younger age or more often. Preventing infection Hepatitis B  If you have a higher risk for hepatitis B, you should be screened for this virus. You are considered at high risk for hepatitis B if: ? You were born in a country where hepatitis B is common. Ask your health care provider which countries are considered high risk. ? Your parents were born in a high-risk country, and you have not been immunized against hepatitis B (hepatitis B vaccine). ? You have HIV or AIDS. ? You use needles to inject street drugs. ? You live with someone who has hepatitis B. ? You have had sex with someone who has hepatitis B. ? You get hemodialysis treatment. ? You take certain medicines for conditions, including cancer, organ transplantation, and autoimmune conditions.  Hepatitis C  Blood testing is recommended for: ? Everyone born from [birth year] through 1967. ? Anyone with known risk factors for hepatitis C.  Sexually transmitted infections (STIs)  You should be screened for sexually transmitted infections (STIs) including gonorrhea and chlamydia if: ? You are sexually active and are younger than 39 years of age. ? You are older than 39 years of age and your health care provider tells you that you are at risk for this type of infection. ? Your sexual activity has changed since you were last screened and you are at an increased risk for chlamydia or gonorrhea. Ask your health care provider if you are at risk.  If you do not have HIV, but are at risk, it may be recommended that you take a prescription medicine daily to prevent HIV infection. This is called pre-exposure prophylaxis (PrEP). You are considered at risk  if: ? You are sexually active and do not regularly use condoms or know the HIV status of your partner(s). ? You take drugs by injection. ? You are sexually active with a partner who has HIV.  Talk with your health care provider about whether you are at high risk of being infected with HIV. If you choose to begin PrEP, you should first be tested for HIV. You should then be tested every 3 months  for as long as you are taking PrEP. Pregnancy  If you are premenopausal and you may become pregnant, ask your health care provider about preconception counseling.  If you may become pregnant, take 400 to 800 micrograms (mcg) of folic acid every day.  If you want to prevent pregnancy, talk to your health care provider about birth control (contraception). Osteoporosis and menopause  Osteoporosis is a disease in which the bones lose minerals and strength with aging. This can result in serious bone fractures. Your risk for osteoporosis can be identified using a bone density scan.  If you are 65 years of age or older, or if you are at risk for osteoporosis and fractures, ask your health care provider if you should be screened.  Ask your health care provider whether you should take a calcium or vitamin D supplement to lower your risk for osteoporosis.  Menopause may have certain physical symptoms and risks.  Hormone replacement therapy may reduce some of these symptoms and risks. Talk to your health care provider about whether hormone replacement therapy is right for you. Follow these instructions at home:  Schedule regular health, dental, and eye exams.  Stay current with your immunizations.  Do not use any tobacco products including cigarettes, chewing tobacco, or electronic cigarettes.  If you are pregnant, do not drink alcohol.  If you are breastfeeding, limit how much and how often you drink alcohol.  Limit alcohol intake to no more than 1 drink per day for nonpregnant women. One drink  equals 12 ounces of beer, 5 ounces of wine, or 1 ounces of hard liquor.  Do not use street drugs.  Do not share needles.  Ask your health care provider for help if you need support or information about quitting drugs.  Tell your health care provider if you often feel depressed.  Tell your health care provider if you have ever been abused or do not feel safe at home. This information is not intended to replace advice given to you by your health care provider. Make sure you discuss any questions you have with your health care provider. Document Released: 11/25/2010 Document Revised: 10/18/2015 Document Reviewed: 02/13/2015 [employer]vier Interactive Patient Education  2018 [employer]  

## 2018-04-11 ENCOUNTER — Encounter: Payer: Self-pay | Admitting: Family Medicine

## 2018-04-11 NOTE — Progress Notes (Deleted)
   LMP 03/27/2018    CC: back pain Subjective:    Patient ID: [name], female    DOB: 1981/02/14, 37 y.o.   MRN: [id number]  HPI: ROZINA POINTER is a 37 y.o. female presenting on 04/12/2018 for No chief complaint on file.   ***  Relevant past medical, surgical, family and social history reviewed and updated as indicated. Interim medical history since our last visit reviewed. Allergies and medications reviewed and updated. Outpatient Medications Prior to Visit  Medication Sig Dispense Refill  . busPIRone (BUSPAR) 5 MG tablet Take 1 tablet (5 mg total) by mouth 2 (two) times daily. 60 tablet 3  . cetirizine (ZYRTEC) 10 MG tablet Take 10 mg by mouth daily.     . Cholecalciferol (VITAMIN D3) 5000 units TABS Take 5,000 Units by mouth daily.    . fluticasone (FLONASE) 50 MCG/ACT nasal spray Place 2 sprays into both nostrils daily.    . metFORMIN (GLUCOPHAGE-XR) 750 MG 24 hr tablet Take 1 tablet (750 mg total) by mouth at bedtime. 90 tablet 3  . metoprolol succinate (TOPROL-XL) 100 MG 24 hr tablet Take 100 mg by mouth daily. Take with or immediately following a meal.    . montelukast (SINGULAIR) 10 MG tablet TAKE ONE TABLET BY MOUTH DAILY 90 tablet 1  . montelukast (SINGULAIR) 10 MG tablet Take 1 tablet (10 mg total) by mouth daily. 90 tablet 3  . nystatin (MYCOSTATIN/NYSTOP) powder Apply topically 4 (four) times daily. 60 g 1  . sertraline (ZOLOFT) 100 MG tablet Take 1 tablet (100 mg total) by mouth daily. 90 tablet 3  . SYNTHROID 175 MCG tablet Take 1 tablet (175 mcg total) by mouth daily. 90 tablet 3   No facility-administered medications prior to visit.      Per HPI unless specifically indicated in ROS section below Review of Systems     Objective:    LMP 03/27/2018   Wt Readings from Last 3 Encounters:  04/07/18 (!) 358 lb (162.4 kg)  07/20/17 (!) 361 lb 6.4 oz (163.9 kg)  07/13/17 (!) 359 lb (162.8 kg)    Physical Exam Results for orders placed or performed in visit  on 04/07/18  POCT glycosylated hemoglobin (Hb A1C)  Result Value Ref Range   Hemoglobin A1C 5.4 4.0 - 5.6 %   HbA1c POC (<> result, manual entry)     HbA1c, POC (prediabetic range)     HbA1c, POC (controlled diabetic range)        Assessment & Plan:   Problem List Items Addressed This Visit    None       No orders of the defined types were placed in this encounter.  No orders of the defined types were placed in this encounter.   Follow up plan: No follow-ups on file.  Ria Bush, MD

## 2018-04-12 ENCOUNTER — Ambulatory Visit: Payer: Managed Care, Other (non HMO) | Admitting: Family Medicine

## 2018-04-14 ENCOUNTER — Encounter: Payer: Self-pay | Admitting: Family Medicine

## 2018-06-16 ENCOUNTER — Other Ambulatory Visit: Payer: Self-pay | Admitting: Internal Medicine

## 2018-06-16 ENCOUNTER — Encounter: Payer: Self-pay | Admitting: Internal Medicine

## 2018-06-16 DIAGNOSIS — E890 Postprocedural hypothyroidism: Secondary | ICD-10-CM

## 2018-06-21 ENCOUNTER — Other Ambulatory Visit: Payer: Self-pay | Admitting: Internal Medicine

## 2018-06-21 ENCOUNTER — Ambulatory Visit: Payer: Self-pay | Admitting: Internal Medicine

## 2018-06-21 ENCOUNTER — Other Ambulatory Visit (INDEPENDENT_AMBULATORY_CARE_PROVIDER_SITE_OTHER): Payer: Managed Care, Other (non HMO)

## 2018-06-21 DIAGNOSIS — E890 Postprocedural hypothyroidism: Secondary | ICD-10-CM

## 2018-06-21 LAB — T4, FREE: FREE T4: 1.26 ng/dL (ref 0.60–1.60)

## 2018-06-21 LAB — TSH: TSH: 1.37 u[IU]/mL (ref 0.35–4.50)

## 2018-06-21 NOTE — Telephone Encounter (Signed)
Ok but needs appt within the next 3 mo

## 2018-06-21 NOTE — Telephone Encounter (Signed)
Last OV 06/22/17 refill or refuse please advise

## 2018-07-10 IMAGING — CR DG CHEST 2V
2 series · 2 of 2 positions shown · non-contrast
Comparison: 05/11/2014

CLINICAL DATA: Palpitations

EXAM:
CHEST  2 VIEW

[chest pa]
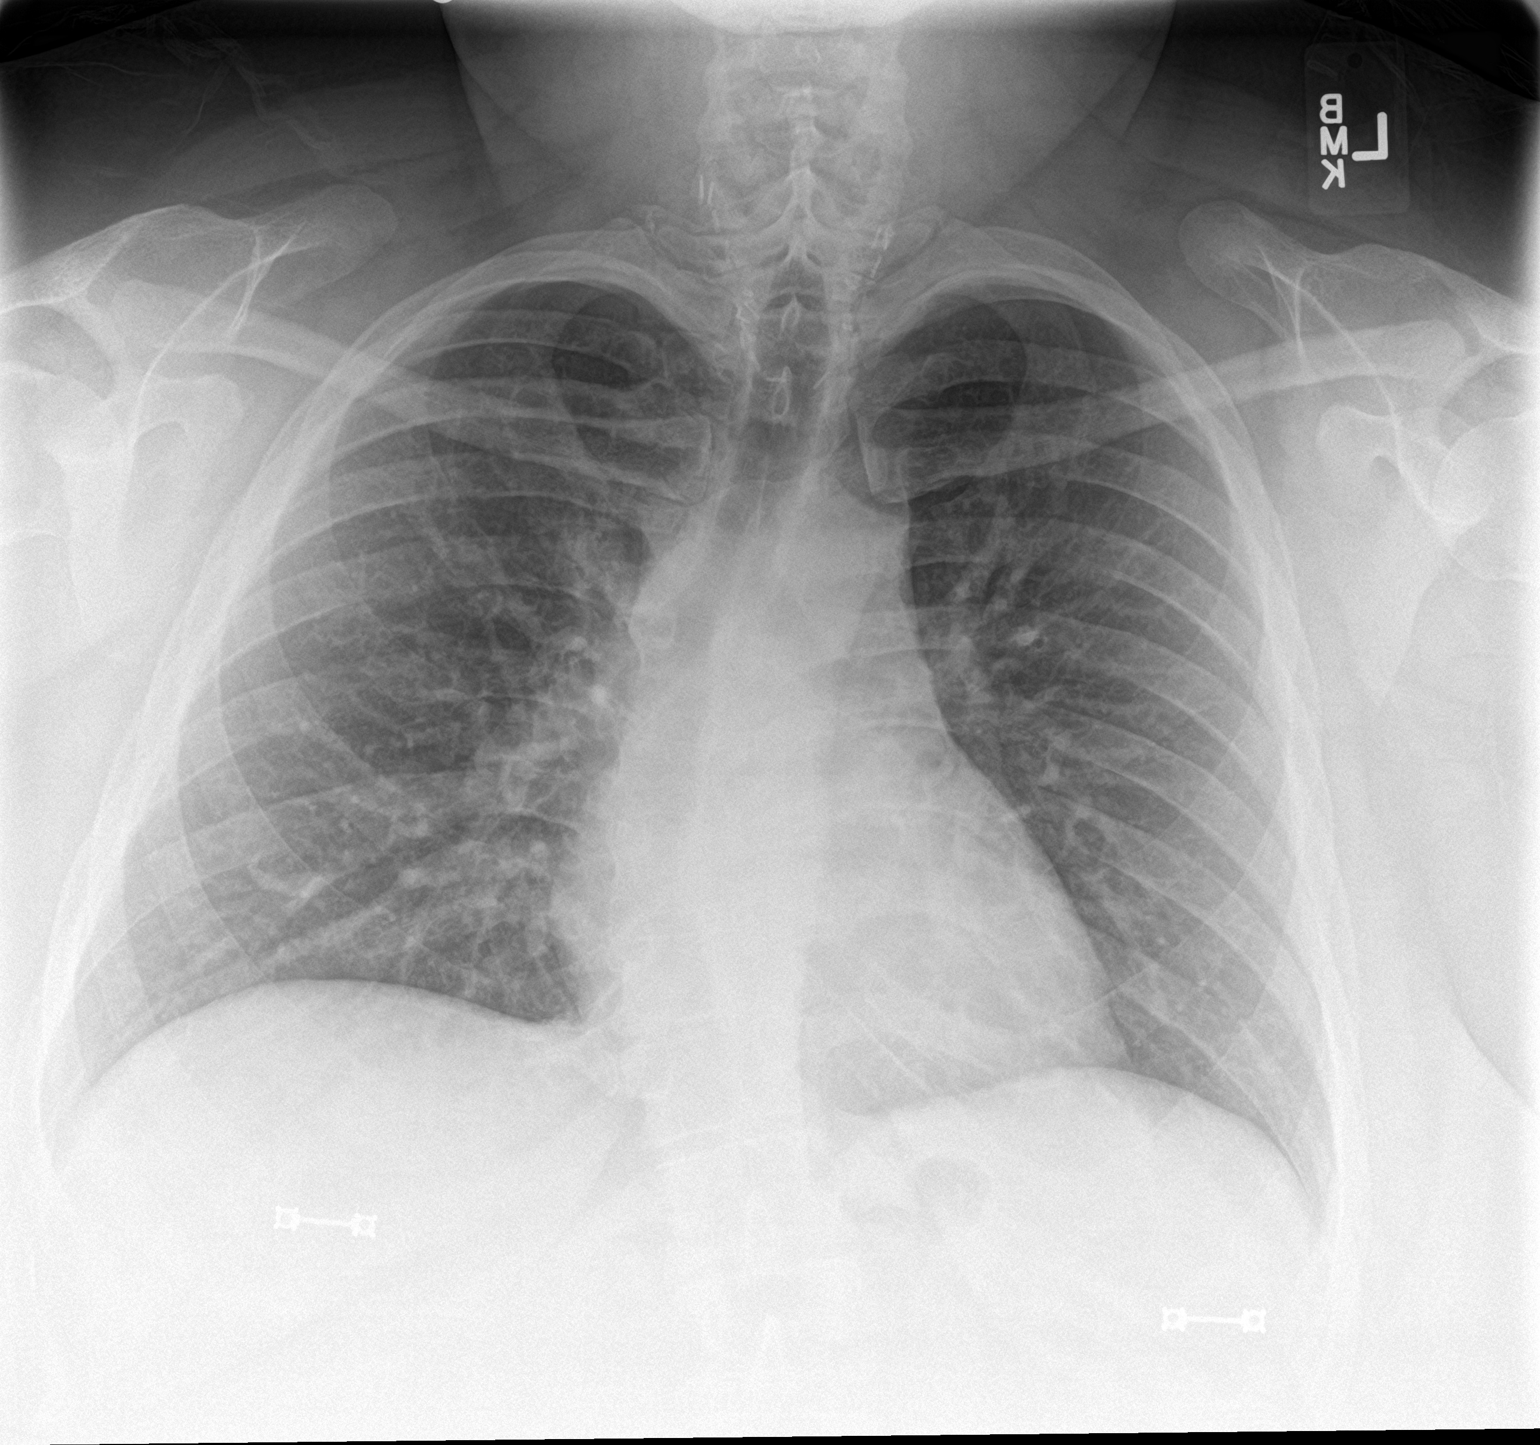

[chest lat]
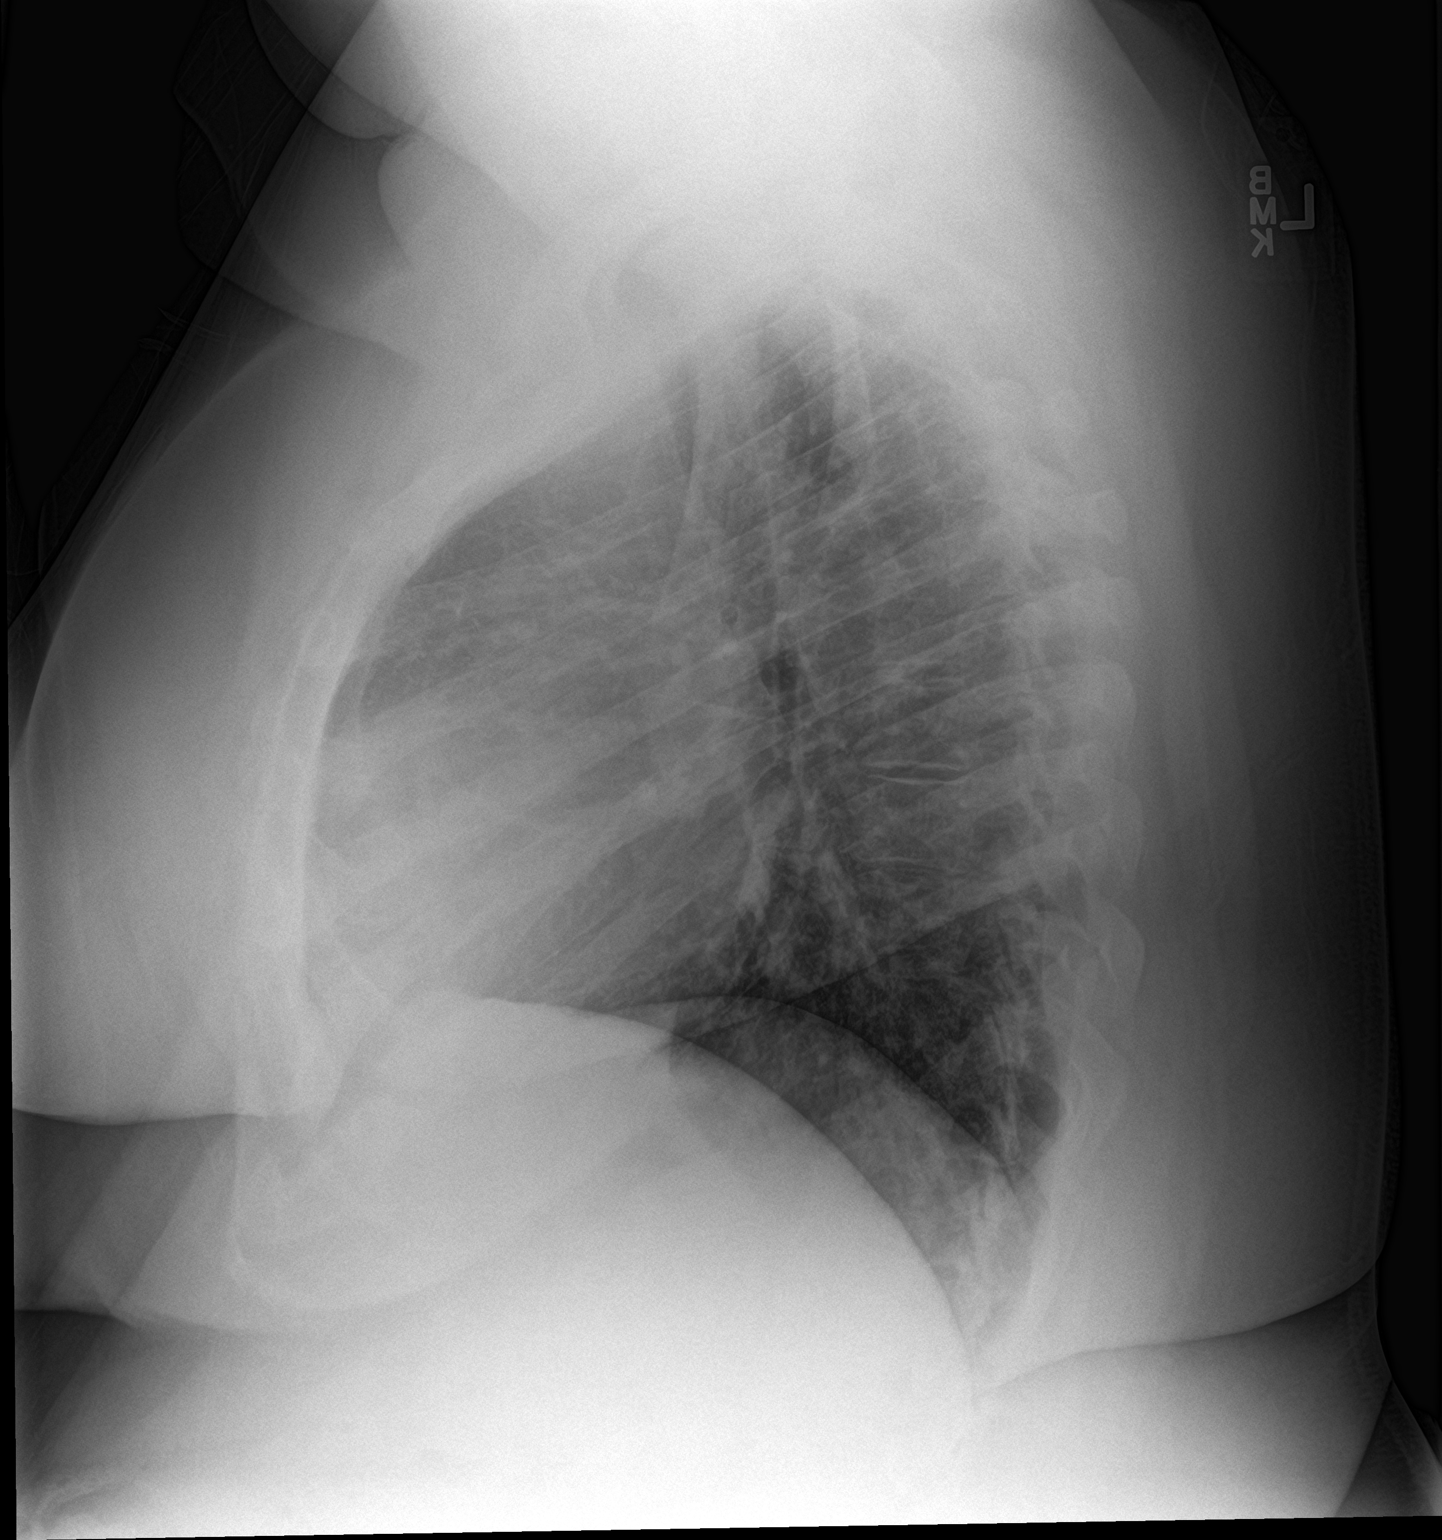

[2 of 2 positions shown; findings below may reference images not displayed]

FINDINGS: The heart size and mediastinal contours are within normal limits.
Both lungs are clear. The visualized skeletal structures are
unremarkable.
IMPRESSION: No active cardiopulmonary disease.

## 2018-07-23 ENCOUNTER — Other Ambulatory Visit: Payer: Self-pay | Admitting: Family Medicine

## 2018-08-17 ENCOUNTER — Telehealth: Payer: Managed Care, Other (non HMO) | Admitting: Family

## 2018-08-17 DIAGNOSIS — B372 Candidiasis of skin and nail: Secondary | ICD-10-CM

## 2018-08-17 MED ORDER — NYSTATIN-TRIAMCINOLONE 100000-0.1 UNIT/GM-% EX OINT: 1.0000 "application " | TOPICAL_OINTMENT | Freq: Two times a day (BID) | CUTANEOUS | 0 refills | Status: DC

## 2018-08-17 NOTE — Progress Notes (Signed)
E Visit for Rash  We are sorry that you are not feeling well. Here is how we plan to help!    Based upon your presentation it appears you have a fungal infection.  I have prescribed: and Nystatin cream apply to the affected area twice daily   HOME CARE:   Take cool showers and avoid direct sunlight.  Apply cool compress or wet dressings.  Take a bath in an oatmeal bath.  Sprinkle content of one Aveeno packet under running faucet with comfortably warm water.  Bathe for 15-20 minutes, 1-2 times daily.  Pat dry with a towel. Do not rub the rash.  Use hydrocortisone cream.  Take an antihistamine like Benadryl for widespread rashes that itch.  The adult dose of Benadryl is 25-50 mg by mouth 4 times daily.  Caution:  This type of medication may cause sleepiness.  Do not drink alcohol, drive, or operate dangerous machinery while taking antihistamines.  Do not take these medications if you have prostate enlargement.  Read package instructions thoroughly on all medications that you take.  GET HELP RIGHT AWAY IF:   Symptoms don't go away after treatment.  Severe itching that persists.  If you rash spreads or swells.  If you rash begins to smell.  If it blisters and opens or develops a yellow-brown crust.  You develop a fever.  You have a sore throat.  You become short of breath.  MAKE SURE YOU:  Understand these instructions. Will watch your condition. Will get help right away if you are not doing well or get worse.  Thank you for choosing an e-visit. Your e-visit answers were reviewed by a board certified advanced clinical practitioner to complete your personal care plan. Depending upon the condition, your plan could have included both over the counter or prescription medications. Please review your pharmacy choice. Be sure that the pharmacy you have chosen is open so that you can pick up your prescription now.  If there is a problem you may message your provider in MyChart  to have the prescription routed to another pharmacy. Your safety is important to us. If you have drug allergies check your prescription carefully.  For the next 24 hours, you can use MyChart to ask questions about today's visit, request a non-urgent call back, or ask for a work or school excuse from your e-visit provider. You will get an email in the next two days asking about your experience. I hope that your e-visit has been valuable and will speed your recovery.      

## 2018-08-19 ENCOUNTER — Telehealth: Payer: Managed Care, Other (non HMO) | Admitting: Nurse Practitioner

## 2018-08-19 DIAGNOSIS — B379 Candidiasis, unspecified: Secondary | ICD-10-CM

## 2018-08-19 MED ORDER — FLUCONAZOLE 150 MG PO TABS: 150.0000 mg | ORAL_TABLET | Freq: Once | ORAL | 0 refills | Status: AC

## 2018-08-19 NOTE — Progress Notes (Signed)

## 2018-09-16 ENCOUNTER — Other Ambulatory Visit: Payer: Self-pay

## 2018-09-16 ENCOUNTER — Encounter: Payer: Self-pay | Admitting: Internal Medicine

## 2018-09-16 ENCOUNTER — Ambulatory Visit (INDEPENDENT_AMBULATORY_CARE_PROVIDER_SITE_OTHER): Payer: Managed Care, Other (non HMO) | Admitting: Internal Medicine

## 2018-09-16 DIAGNOSIS — R5383 Other fatigue: Secondary | ICD-10-CM | POA: Diagnosis not present

## 2018-09-16 DIAGNOSIS — E890 Postprocedural hypothyroidism: Secondary | ICD-10-CM | POA: Diagnosis not present

## 2018-09-16 DIAGNOSIS — C73 Malignant neoplasm of thyroid gland: Secondary | ICD-10-CM | POA: Diagnosis not present

## 2018-09-16 DIAGNOSIS — E559 Vitamin D deficiency, unspecified: Secondary | ICD-10-CM | POA: Diagnosis not present

## 2018-09-16 NOTE — Progress Notes (Signed)
Patient ID: Linda Welch, female   DOB: May 13, 1981, 38 y.o.   MRN: [id number]  Patient location: Work My location: Office  Referring Provider: Ria Bush, MD  I connected with the patient on 09/16/18 at  9:32 AM EDT by a video enabled telemedicine application and verified that I am speaking with the correct person.   I discussed the limitations of evaluation and management by telemedicine and the availability of in person appointments. The patient expressed understanding and agreed to proceed.   Details of the encounter are shown below.  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  Last visit 1 year and 3 months ago.  Reviewed her cancer history: - dx in in 02/2009 >> 3 cm nodule felt by pt >> seen by PCP >> referred to endo >> uptake and scan >> cold nodule >> FNA by Dr Francoise Schaumann: benign (bad experience).  - thyroid U/S 2010: 3.1 x 2.6 x 2.2 cm complex, slightly hypoechoic, nodule, situated in the mid pole of the right thyroid lobe - Thyroid ultrasound 02/06/2014: Large nodule occupying most of the right lobe measures 5.2 x 3.2 x 4.1 cm. There are some internal calcifications. It previously measured 3.1 x 2.6 x 2.2 cm based on the prior report. - FNA 94/17/4081: FOLLICULAR NEOPLASM AND/OR LESION (BETHESDA IV)  - Right lobectomy 05/16/2014 and completion thyroidectomy 06/01/2014 Thyroid, lobectomy, right - FOLLICULAR VARIANT OF PAPILLARY THYROID CARCINOMA WITH CAPSULAR INVASION, 4.2 CM, CONFINED WITHIN THYROIDAL TISSUE. - NO EVIDENCE OF ANGIOLYMPHATIC INVASION IDENTIFIED. - RESECTION MARGINS, NEGATIVE FOR ATYPIA OR MALIGNANCY. Microscopic Comment THYROID Specimen: Right thyroid Procedure: Right hemithyroidectomy Specimen Integrity (intact/fragmented): Intact Tumor focality: Unifocal Maximum tumor size (cm): 4.2 cm, gross description Tumor laterality: Right  thyroid gland Histologic type (including subtype and/or unique features as applicable): Follicular variant of papillary thyroid carcinoma Tumor capsule: Present Extrathyroidal extension: No Margins: Negative Lymph - Vascular invasion: Not identified Capsular invasion with degree of invasion if present: Present, complete Lymph nodes: # examined N/A; # positive; N/A TNM code: pT3 , pNX Non-neoplastic thyroid: Unremarkable. Due to the low risk of her thyroid tumor, I did not recommend I-131 remnant ablation.  01/07/2016: Neck ultrasound: Surgical changes of total thyroidectomy without evidence of residual thyroid tissue, nodularity or lymphadenopathy.  Hypothyroidism: Pt. has been dx with hypothyroidism in 2010, now postoperative hypothyroidism; she is on Synthroid d.a.w.  Pt is on Synthroid 175 Mcg daily, taken: - in am - fasting - at least 30 min from b'fast - no Ca, Fe, MVI, PPIs - not on Biotin  I reviewed patient's TFTs: Lab Results  Component Value Date   TSH 1.37 06/21/2018   TSH 4.45 01/11/2018   TSH 2.22 06/22/2017   TSH 2.99 12/22/2016   TSH 4.612 (H) 07/15/2016   FREET4 1.26 06/21/2018   FREET4 0.75 01/11/2018   FREET4 1.01 06/22/2017   FREET4 0.94 12/22/2016   FREET4 0.91 06/23/2016  03/29/2013: TSH 0.93, fT4 1.03 02/04/2013 (towards end of pregnancy): TSH 0.12, fT4 0.96 >> Synthroid reduced from 100 to 88 mcg 12/2012: TSH 0.07 >> Synthroid reduced from 112 to 100 mcg  Her thyroglobulin levels are decreasing: Lab Results  Component Value Date   THYROGLB 0.2 (L) 06/22/2017   THYROGLB 0.3 (L) 12/22/2016   THYROGLB 0.6 (L) 12/24/2015   THGAB <1 06/22/2017   THGAB <1 12/22/2016   THGAB <1 12/24/2015   Pt denies: - feeling nodules in neck -  hoarseness - dysphagia - choking - SOB with lying down  Vitamin D insufficiency   She continues on 5000 units vitamin D daily  Reviewed her vitamin D levels: Lab Results  Component Value Date   VD25OH 32.43  01/11/2018   VD25OH 29.92 (L) 06/22/2017   VD25OH 34.67 12/22/2016   VD25OH 23.51 (L) 06/23/2016   VD25OH 9.6 (L) 11/05/2015   In the past, patient complained of palpitations >> started fall 2017 after we last increased her LT4, but exacerbated 06/2016.  Of note, she had full cardiac investigation including a Holter monitor >> PVC and PACs (217 events per 48h).  She continues on metoprolol 1-2 times a day.  Patient also has PCOS. Last A1c was normal: Lab Results  Component Value Date   HGBA1C 5.4 04/07/2018    ROS: Constitutional: no weight gain/no weight loss, + fatigue, + subjective hyperthermia, + subjective hypothermia Eyes: no blurry vision, no xerophthalmia ENT: no sore throat, no nodules palpated in neck, no dysphagia, no odynophagia, no hoarseness Cardiovascular: no CP/no SOB/no palpitations/no leg swelling Respiratory: no cough/no SOB/no wheezing Gastrointestinal: no N/no V/no D/+ C/no acid reflux Musculoskeletal: no muscle aches/no joint aches Skin: no rashes, no hair loss, + dry skin Neurological: no tremors/no numbness/no tingling/no dizziness  I reviewed pt's medications, allergies, PMH, social hx, family hx, and changes were documented in the history of present illness. Otherwise, unchanged from my initial visit note.  Past Medical History:  Diagnosis Date  . Allergy   . Anxiety   . Cancer [facility])    "thyroid cancer - thyroidectomy January 2016"  . Complication of anesthesia    "post operative myalgia"  . Depression   . Difficult intubation    "due to limited neck mobility"  . GERD (gastroesophageal reflux disease)    "not taking Prilosec any longer"  . Hashimoto's thyroiditis 2010  . History of bronchitis   . History of influenza pneumonia 2012  . History of kidney stones    lithotripsy  . Migraine    migraines- usually x1 weekly  . OSA on CPAP    cpap nightly-settings 16  . Papillary carcinoma, follicular variant ([place]) 05/31/2014  . PCOS (polycystic  ovarian syndrome) 2003   takes Metformin for PCOS  . Stress incontinence   . Wears glasses    Past Surgical History:  Procedure Laterality Date  . ACHILLES TENDON SURGERY N/A 01/29/2016   Procedure: RIGHT ACHILLES  RECONSTRUCTION;  Surgeon: Wylene Simmer, MD;  Location: [place];  Service: Orthopedics;  Laterality: N/A;  . EXTRACORPOREAL SHOCK WAVE LITHOTRIPSY Left   . GASTROCNEMIUS RECESSION N/A 01/29/2016   Procedure: GASTROC RECESSION POSSIBLE FLEXIBLE FLEXOR HALGUS LONGUS TRANSER;  Surgeon: Wylene Simmer, MD;  Location: [place];  Service: Orthopedics;  Laterality: N/A;  . THYROID LOBECTOMY Right 05/16/2014   Procedure: RIGHT THYROID LOBECTOMY;  Surgeon: Armandina Gemma, MD;  Location: [facility];  Service: General;  Laterality: Right;  . THYROIDECTOMY N/A 06/01/2014   Procedure: COMPLETION THYROIDECTOMY;  Surgeon: Armandina Gemma, MD;  Location: [facility];  Service: General;  Laterality: N/A;  . TONSILECTOMY, ADENOIDECTOMY, BILATERAL MYRINGOTOMY AND TUBES     1984 and 1989   Social History   Socioeconomic History  . Marital status: Married    Spouse name: Not on file  . Number of children: Not on file  . Years of education: Not on file  . Highest education level: Not on file  Occupational History  . Not on file  Social Needs  . Financial resource strain:  Not on file  . Food insecurity:    Worry: Not on file    Inability: Not on file  . Transportation needs:    Medical: Not on file    Non-medical: Not on file  Tobacco Use  . Smoking status: Former Smoker    Packs/day: 0.25    Years: 2.00    Pack years: 0.50    Types: Cigarettes    Last attempt to quit: 05/26/1998    Years since quitting: 20.3  . Smokeless tobacco: Never Used  . Tobacco comment: "quit smoking in 2000 - smoked for 2 years"   Substance and Sexual Activity  . Alcohol use: No    Alcohol/week: 0.0 standard drinks  . Drug use: No  . Sexual activity: Yes  Lifestyle  . Physical activity:    Days per week: Not on file    Minutes per  session: Not on file  . Stress: Not on file  Relationships  . Social connections:    Talks on phone: Not on file    Gets together: Not on file    Attends religious service: Not on file    Active member of club or organization: Not on file    Attends meetings of clubs or organizations: Not on file    Relationship status: Not on file  . Intimate partner violence:    Fear of current or ex partner: Not on file    Emotionally abused: Not on file    Physically abused: Not on file    Forced sexual activity: Not on file  Other Topics Concern  . Not on file  Social History Narrative   Divorced    Lives with 2nd husband and 2 boys (2006, 2014)   Occ: florist for [employer]   Edu: some college   Current Outpatient Medications on File Prior to Visit  Medication Sig Dispense Refill  . cetirizine (ZYRTEC) 10 MG tablet Take 10 mg by mouth daily.     . Cholecalciferol (VITAMIN D3) 5000 units TABS Take 5,000 Units by mouth daily.    . fluticasone (FLONASE) 50 MCG/ACT nasal spray Place 2 sprays into both nostrils daily.    . metFORMIN (GLUCOPHAGE-XR) 750 MG 24 hr tablet TAKE ONE TABLET BY MOUTH EVERY NIGHT AT BEDTIME 90 tablet 2  . metoprolol succinate (TOPROL-XL) 100 MG 24 hr tablet Take 100 mg by mouth daily. Take with or immediately following a meal.    . montelukast (SINGULAIR) 10 MG tablet TAKE ONE TABLET BY MOUTH DAILY 90 tablet 1  . montelukast (SINGULAIR) 10 MG tablet TAKE ONE TABLET BY MOUTH DAILY 90 tablet 2  . nystatin (MYCOSTATIN/NYSTOP) powder Apply topically 4 (four) times daily. 60 g 1  . nystatin-triamcinolone ointment (MYCOLOG) Apply 1 application topically 2 (two) times daily. 30 g 0  . sertraline (ZOLOFT) 100 MG tablet Take 1 tablet (100 mg total) by mouth daily. 90 tablet 3  . SYNTHROID 175 MCG tablet TAKE ONE TABLET BY MOUTH DAILY 90 tablet 2   No current facility-administered medications on file prior to visit.    Allergies  Allergen Reactions  . Tioconazole Itching,  Swelling and Other (See Comments)    Burning (severe)  . Tape Hives and Swelling    Adhesive tapes  . Buspar [Buspirone]     Worsening mood   Family History  Problem Relation Age of Onset  . Thyroid disease Mother        thyroid goiter  . Depression Mother   . Hyperlipidemia  Mother   . Hypertension Mother   . CAD Mother 16       triple bypass  . Hyperlipidemia Father   . Hypertension Father   . Sleep apnea Father   . CAD Father 25       MI, stent  . Asthma Brother   . Depression Brother   . Heart disease Maternal Grandmother   . Hyperlipidemia Maternal Grandmother   . Heart disease Maternal Grandfather   . Hyperlipidemia Maternal Grandfather   . Heart disease Paternal Grandmother   . Hyperlipidemia Paternal Grandmother   . Heart disease Paternal Grandfather   . Hyperlipidemia Paternal Grandfather   . Diabetes Paternal Grandfather   . Sleep apnea Paternal Grandfather   . Cancer Neg Hx     PE: There were no vitals taken for this visit. Wt Readings from Last 3 Encounters:  04/07/18 (!) 358 lb (162.4 kg)  07/20/17 (!) 361 lb 6.4 oz (163.9 kg)  07/13/17 (!) 359 lb (162.8 kg)   Constitutional:  in NAD  The physical exam was not performed (virtual visit).  ASSESSMENT: 1. Follicular variant of PTC  2. Postsurgical hypothyroidism  3. Vitamin D deficiency  4. Fatigue  PLAN:  1. PTC - Patient with follicular variant of papillary thyroid cancer, the least aggressive type of cancer.  This was encapsulated and it did not spread outside the thyroid.  She is stage I TNM and her prognosis is excellent. -She denies any neck compression symptoms -Reviewed latest ultrasound report from 11/2015: No recurrence or metastases -Reviewed her thyroglobulin and ATA from last year: Thyroglobulin was lower than before, trending down, which, we discussed, is 1 of the most important parameters for her cancer follow-up -We will recheck and ATA and thyroglobulin when she returns to the  clinic -I will see the patient back in a year, but will order labs to be done sooner  2. Hypothyroidism - latest thyroid labs reviewed with pt >> normal  - she continues on Synthroid d.a.w. 175 Mcg daily - pt feels good on this dose, but in 05/2018 she had constipation, increased fatigue, dry skin, cold intolerance and we rechecked her TFTs then.  They were normal. - we discussed about taking the thyroid hormone every day, with water, >30 minutes before breakfast, separated by >4 hours from acid reflux medications, calcium, iron, multivitamins. Pt. is taking it correctly. - will check thyroid tests when she returns to the clinic: TSH and fT4 - If labs are abnormal, she will need to return for repeat TFTs in 1.5 months  3. Vitamin D deficiency -Reviewed latest vitamin D results: Normal 12/2017.   -She continues on 5000 units vitamin D daily. -We will recheck her level when she returns to the clinic  4. Fatigue - per her request, will check a B12 vitamin level. Her Mother and sister have vit B12 def.  Orders Placed This Encounter  Procedures  . Vitamin B12  . TSH  . T4, free  . VITAMIN D 25 Hydroxy (Vit-D Deficiency, Fractures)  . Thyroglobulin Level  . Thyroglobulin antibody   Philemon Kingdom, MD PhD [facility]

## 2018-09-16 NOTE — Progress Notes (Signed)
Called patient 3 times to ensure link is working, no answer.

## 2018-09-16 NOTE — Patient Instructions (Signed)
Please come back for labs when safe.  Please continue Synthroid 175 mcg daily.  .Take the thyroid hormone every day, with water, at least 30 minutes before breakfast, separated by at least 4 hours from: - acid reflux medications - calcium - iron - multivitamins  Continue 5000 units vitamin D daily.  Please come back for a follow-up appointment in 1 year.

## 2018-10-07 ENCOUNTER — Other Ambulatory Visit: Payer: Self-pay | Admitting: Family Medicine

## 2018-10-07 ENCOUNTER — Other Ambulatory Visit: Payer: Self-pay | Admitting: Family

## 2018-10-08 ENCOUNTER — Encounter: Payer: Self-pay | Admitting: Family Medicine

## 2018-10-08 ENCOUNTER — Ambulatory Visit (INDEPENDENT_AMBULATORY_CARE_PROVIDER_SITE_OTHER): Payer: Managed Care, Other (non HMO) | Admitting: Family Medicine

## 2018-10-08 VITALS — Ht 62.0 in

## 2018-10-08 DIAGNOSIS — R6884 Jaw pain: Secondary | ICD-10-CM | POA: Diagnosis not present

## 2018-10-08 MED ORDER — FLUTICASONE PROPIONATE 50 MCG/ACT NA SUSP: 2.0000 | Freq: Every day | NASAL | Status: DC

## 2018-10-08 MED ORDER — CYCLOBENZAPRINE HCL 5 MG PO TABS: 2.5000 mg | ORAL_TABLET | Freq: Three times a day (TID) | ORAL | 1 refills | Status: DC | PRN

## 2018-10-08 MED ORDER — NYSTATIN-TRIAMCINOLONE 100000-0.1 UNIT/GM-% EX OINT: 1.0000 "application " | TOPICAL_OINTMENT | Freq: Two times a day (BID) | CUTANEOUS | 0 refills | Status: DC

## 2018-10-08 NOTE — Progress Notes (Signed)
Virtual visit completed through [employer]. Due to national recommendations of social distancing due to [place] 19, a virtual visit is felt to be most appropriate for this patient at this time.   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, office If any vitals were documented, they were collected by patient at home unless specified below.    Ht 5\' 2"  (1.575 m)   BMI 65.48 kg/m    CC: TMJ pain, discuss metformin Subjective:    Patient ID: Linda Welch, female    DOB: January 19, 1981, 38 y.o.   MRN: [id number]  HPI: Linda Welch is a 38 y.o. female presenting on 10/08/2018 for Temporomandibular Joint Pain (C/o TMJ pain in left jaw. Started 2 mos ago. ) and Medication Problem (C/o diarrhea with metformin. )   Several month h/o L jaw/ear/tooth and neck pain, acutely worse and constant over the last 5 days. Pain described as dull constant ache, fullness/pressure. H/o TMJ years ago, this feels similar. Points to L TMJ. Also having R earache and headache behind R eye. So far has tried ibuprofen 600mg  in evenings with temporary relief. She has been using ice roller as well. Left side at TMJ feels more mobile that R. Clenching jaw hurts. Upper and lower molars hurt, but no pain when pushing on teeth themselves.  No tinnitus or hearing changes.  Overdue for dental eval.        Relevant past medical, surgical, family and social history reviewed and updated as indicated. Interim medical history since our last visit reviewed. Allergies and medications reviewed and updated. Outpatient Medications Prior to Visit  Medication Sig Dispense Refill  . cetirizine (ZYRTEC) 10 MG tablet Take 10 mg by mouth daily.     . Cholecalciferol (VITAMIN D3) 5000 units TABS Take 5,000 Units by mouth daily.    . fluticasone (FLONASE) 50 MCG/ACT nasal spray Place 2 sprays into both nostrils daily.    . metFORMIN (GLUCOPHAGE-XR) 750 MG 24 hr tablet TAKE ONE TABLET BY MOUTH EVERY NIGHT AT BEDTIME 90  tablet 2  . metoprolol succinate (TOPROL-XL) 100 MG 24 hr tablet Take 100 mg by mouth daily. Take with or immediately following a meal.    . montelukast (SINGULAIR) 10 MG tablet TAKE ONE TABLET BY MOUTH DAILY 90 tablet 1  . montelukast (SINGULAIR) 10 MG tablet TAKE ONE TABLET BY MOUTH DAILY 90 tablet 2  . nystatin (MYCOSTATIN/NYSTOP) powder Apply topically 4 (four) times daily. 60 g 1  . nystatin-triamcinolone ointment (MYCOLOG) Apply 1 application topically 2 (two) times daily. 30 g 0  . sertraline (ZOLOFT) 100 MG tablet Take 1 tablet (100 mg total) by mouth daily. 90 tablet 3  . SYNTHROID 175 MCG tablet TAKE ONE TABLET BY MOUTH DAILY 90 tablet 2   No facility-administered medications prior to visit.      Per HPI unless specifically indicated in ROS section below Review of Systems Objective:    Ht 5\' 2"  (1.575 m)   BMI 65.48 kg/m   Wt Readings from Last 3 Encounters:  04/07/18 (!) 358 lb (162.4 kg)  07/20/17 (!) 361 lb 6.4 oz (163.9 kg)  07/13/17 (!) 359 lb (162.8 kg)     Physical exam: Gen: alert, NAD, not ill appearing Pulm: speaks in complete sentences without increased work of breathing Psych: normal mood, normal thought content      Results for orders placed or performed in visit on 06/21/18  T4, free  Result Value Ref Range   Free T4 1.26  0.60 - 1.60 ng/dL  TSH  Result Value Ref Range   TSH 1.37 0.35 - 4.50 uIU/mL   Assessment & Plan:   Problem List Items Addressed This Visit    Jaw pain - Primary    Story and symptoms most consistent with TMJ pain. Discussed continued NSAID, add flexeril 5mg  1/2-1 tab TID PRN - discussed sedation precautions. Discussed isometric TMJ exercises to try. rec continue cool and warm compresses to jaw. She already avoids chewing gum. Advised f/u with dentist if ongoing symptoms - she will call for appt. May benefit from mouth guard. She will seek in-office evaluation if not improving with treatment.           Meds ordered this  encounter  Medications  . cyclobenzaprine (FLEXERIL) 5 MG tablet    Sig: Take 0.5-1 tablets (2.5-5 mg total) by mouth 3 (three) times daily as needed (TMJ pain).    Dispense:  30 tablet    Refill:  1   No orders of the defined types were placed in this encounter.   Follow up plan: No follow-ups on file.  Ria Bush, MD

## 2018-10-08 NOTE — Assessment & Plan Note (Signed)
Story and symptoms most consistent with TMJ pain. Discussed continued NSAID, add flexeril 5mg  1/2-1 tab TID PRN - discussed sedation precautions. Discussed isometric TMJ exercises to try. rec continue cool and warm compresses to jaw. She already avoids chewing gum. Advised f/u with dentist if ongoing symptoms - she will call for appt. May benefit from mouth guard. She will seek in-office evaluation if not improving with treatment.

## 2018-11-30 ENCOUNTER — Telehealth: Payer: Self-pay | Admitting: *Deleted

## 2018-11-30 ENCOUNTER — Ambulatory Visit (INDEPENDENT_AMBULATORY_CARE_PROVIDER_SITE_OTHER): Payer: Managed Care, Other (non HMO) | Admitting: Family Medicine

## 2018-11-30 ENCOUNTER — Encounter: Payer: Self-pay | Admitting: Family Medicine

## 2018-11-30 ENCOUNTER — Telehealth: Payer: Self-pay

## 2018-11-30 DIAGNOSIS — Z20828 Contact with and (suspected) exposure to other viral communicable diseases: Secondary | ICD-10-CM

## 2018-11-30 DIAGNOSIS — Z20822 Contact with and (suspected) exposure to covid-19: Secondary | ICD-10-CM

## 2018-11-30 NOTE — Telephone Encounter (Signed)
Scheduled patient for COVID 19 test 12/01/2018 at [facility].  Testing protocol reviewed with patient.

## 2018-11-30 NOTE — Telephone Encounter (Signed)
Plz notify I will forward recall info to Dr Cruzita Lederer who prescribes her metformin XR.  She may need to switch to IR formulation but will leave decision to Dr Cruzita Lederer.

## 2018-11-30 NOTE — Progress Notes (Signed)
I connected with [name] on 11/30/18 at 12:20 PM EDT by video and verified that I am speaking with the correct person using two identifiers.   I discussed the limitations, risks, security and privacy concerns of performing an evaluation and management service by video and the availability of in person appointments. I also discussed with the patient that there may be a patient responsible charge related to this service. The patient expressed understanding and agreed to proceed.  Patient location: Work - Public house manager Provider Location: [place] Participants: Lesleigh Noe and [name]   Subjective:     Linda Welch is a 38 y.o. female presenting for Discuss COVID (co worker is been tested for [place] and that coworkers husband was positive for [place]. Last contact with co worker was on 11/27/2018. )     HPI   #Covid exposure - Coworker is being tested for coronavirus - contact with husband 2 weeks ago, but no close contact - works very closely with the coworker - coworker was kissing/hugging on the patient's son the other day - husband with type 1 diabetes and sick and immunocompromised - job advised calling - yesterday had some general fatigue - felt drained - has a lot going on - emotional - father in law passed away, mother had stokes   Review of Systems  Constitutional: Positive for fatigue. Negative for chills and fever.  HENT: Positive for congestion, rhinorrhea, sneezing and sore throat.   Respiratory: Negative for cough and shortness of breath.   Gastrointestinal: Positive for diarrhea (gets this occasionally with metformin, unclear if worse). Negative for nausea and vomiting.  Musculoskeletal: Negative for arthralgias and myalgias.     Social History   Tobacco Use  Smoking Status Former Smoker  . Packs/day: 0.25  . Years: 2.00  . Pack years: 0.50  . Types: Cigarettes  . Quit date: 05/26/1998  . Years since quitting: 20.5  Smokeless  Tobacco Never Used  Tobacco Comment   "quit smoking in 2000 - smoked for 2 years"         Objective:   BP Readings from Last 3 Encounters:  04/07/18 122/78  07/20/17 128/80  07/13/17 126/88   Wt Readings from Last 3 Encounters:  04/07/18 (!) 358 lb (162.4 kg)  07/20/17 (!) 361 lb 6.4 oz (163.9 kg)  07/13/17 (!) 359 lb (162.8 kg)   Physical Exam Constitutional:      Appearance: Normal appearance. She is not ill-appearing.  HENT:     Head: Normocephalic and atraumatic.     Right Ear: External ear normal.     Left Ear: External ear normal.  Eyes:     Conjunctiva/sclera: Conjunctivae normal.  Pulmonary:     Effort: Pulmonary effort is normal. No respiratory distress.  Neurological:     Mental Status: She is alert. Mental status is at baseline.  Psychiatric:        Mood and Affect: Mood normal.        Behavior: Behavior normal.        Thought Content: Thought content normal.        Judgment: Judgment normal.          Assessment & Plan:   Problem List Items Addressed This Visit    None    Visit Diagnoses    Close Exposure to Covid-19 Virus    -  Primary      Given exposure, recommend being tested to r/o covid-19 Already taking allergy  medication as with sneezing this would be alternative diagnosis ER precautions given Advised staying out of work and isolating until results have come back Referral to [facility] for testing made   Return if symptoms worsen or fail to improve.  Lesleigh Noe, MD

## 2018-11-30 NOTE — Telephone Encounter (Signed)
Pt left v/m that pt works with someone that was tested today for covid and that coworker has fever and that coworker's husband has tested positive for covid.. Pt wants to know what she should do. Pt has S/T and cough all the time due to pt having thyroid CA. No other covid symptoms and no travel. Pt scheduled virtual appt today at 12:20 with Dr Einar Pheasant to see if pt needs to be scheduled for covid testing.

## 2018-11-30 NOTE — Telephone Encounter (Signed)
See note from today

## 2018-11-30 NOTE — Patient Instructions (Signed)
How to care for yourself at home:   1) Drink plenty of fluids 2) Get lots of rest 3) Wash your hands regularly - for 20 seconds 4) Cover your mouth when you cough -- ideally cough into your elbow 5) Take tylenol - can do up to 1000 mg every 8 hours (or do smaller doses more frequently) - Avoid Ibuprofen if able 6) Stay home - avoid contact with other people. Ideally have someone bring your groceries, etc.  7) Avoid touching your eyes, nose, mouth 8) Over the counter cold medication may be helpful  You leave home again - when ALL the following are TRUE:  1) No fever for at least 72 hours (without taking any medication) 2) Other symptoms have improved - no longer with cough or shortness of breath 3) At least 7 days since you got sick  Call the clinic immediately or consider going to the emergency room if:  1) Trouble Breathing 2) Persistent pain or pressure in the chest 3) New confusion or inability to wake 4) Bluish lips or face 5) Notify them that you may have COVID-19  For close contacts (people in your home)  1) Help the person you are with stay home - grocery, pharmacy, etc 2) Help monitor their symptoms for worsening illness 3) Ideally sleep in a separate room and try to use separate bathroom if possible 4) Try to limit care giver to ONE person - all others (including pets) should avoid contact 5) Clean surfaces often and wash laundry often 6) Monitor yourself for symptoms. Make sure you contact your health care provider and avoid work as soon as you develop symptoms 7) Ideally would recommend working from home or not going to work if able.    Medications:  You may have heard about medications currently being used to treat COVID-19 - including Remdesivir and Hydroxychloroquine/Chloroquine. Currently there are no FDA approved medications to treat COVID-19 and these medicines are only being used as part of a clinical trials (still studying the effect). They are only being used in  hospitalized patients because they come with significant risks.   The only proven treatment is symptomatic care - rest, fluids, tylenol.    Below is more detailed information from the [employer]  Individuals who are confirmed to have, or are being evaluated for, COVID-19 should follow the prevention steps below until a healthcare provider or local or state health department says they can return to normal activities.  Stay home except to get medical care You should restrict activities outside your home, except for getting medical care. Do not go to work, school, or public areas, and do not use public transportation or taxis.  Call ahead before visiting your doctor Before your medical appointment, call the healthcare provider and tell them that you are being evaluated for, COVID-19 infection.  Monitor your symptoms Seek prompt medical attention if your illness is worsening (e.g., difficulty breathing).   Wear a facemask You should wear a facemask that covers your nose and mouth when you are in the same room with other people and when you visit a healthcare provider.   Separate yourself from other people in your home As much as possible, you should stay in a different room from other people in your home. Also, you should use a separate bathroom, if available.  Avoid sharing household items You should not share dishes, drinking glasses, cups, eating utensils, towels, bedding, or other items with other people in your home. After using these  items, you should wash them thoroughly with soap and water.  Cover your coughs and sneezes Cover your mouth and nose with a tissue when you cough or sneeze, or you can cough or sneeze into your sleeve. Throw used tissues in a lined trash can, and immediately wash your hands with soap and water for at least 20 seconds or use an alcohol-based hand rub.  Wash your [facility] your hands often and thoroughly with soap and water for at least  20 seconds. You can use an alcohol-based hand sanitizer if soap and water are not available and if your hands are not visibly dirty. Avoid touching your eyes, nose, and mouth with unwashed hands.   Prevention Steps for Caregivers and Household Members of Individuals Confirmed to have, or Being Evaluated for, COVID-19 Infection Being Cared for in the Home  If you live with, or provide care at home for, a person confirmed to have, or being evaluated for, COVID-19 infection please follow these guidelines to prevent infection:  Follow healthcare provider's instructions Make sure that you understand and can help the patient follow any healthcare provider instructions for all care.  Provide for the patient's basic needs You should help the patient with basic needs in the home and provide support for getting groceries, prescriptions, and other personal needs.  Monitor the patient's symptoms If they are getting sicker, call his or her medical provider.   Limit the number of people who have contact with the patient  If possible, have only one caregiver for the patient.  Other household members should stay in another home or place of residence. If this is not possible, they should stay  in another room, or be separated from the patient as much as possible. Use a separate bathroom, if available.  Restrict visitors who do not have an essential need to be in the home.  Keep older adults, very young children, and other sick people away from the patient Keep older adults, very young children, and those who have compromised immune systems or chronic health conditions away from the patient. This includes people with chronic heart, lung, or kidney conditions, diabetes, and cancer.  Ensure good ventilation Make sure that shared spaces in the home have good air flow, such as from an air conditioner or an opened window, weather permitting.  Wash your hands often  Wash your hands often and  thoroughly with soap and water for at least 20 seconds. You can use an alcohol based hand sanitizer if soap and water are not available and if your hands are not visibly dirty.  Avoid touching your eyes, nose, and mouth with unwashed hands.  Use disposable paper towels to dry your hands. If not available, use dedicated cloth towels and replace them when they become wet.  Wear a facemask and gloves  Wear a disposable facemask at all times in the room and gloves when you touch or have contact with the patient's blood, body fluids, and/or secretions or excretions, such as sweat, saliva, sputum, nasal mucus, vomit, urine, or feces.  Ensure the mask fits over your nose and mouth tightly, and do not touch it during use.  Throw out disposable facemasks and gloves after using them. Do not reuse.  Wash your hands immediately after removing your facemask and gloves.  If your personal clothing becomes contaminated, carefully remove clothing and launder. Wash your hands after handling contaminated clothing.  Place all used disposable facemasks, gloves, and other waste in a lined container before disposing them  with other household waste.  Remove gloves and wash your hands immediately after handling these items.  Do not share dishes, glasses, or other household items with the patient  Avoid sharing household items. You should not share dishes, drinking glasses, cups, eating utensils, towels, bedding, or other items with a patient who is confirmed to have, or being evaluated for, COVID-19 infection.  After the person uses these items, you should wash them thoroughly with soap and water.  Wash laundry thoroughly  Immediately remove and wash clothes or bedding that have blood, body fluids, and/or secretions or excretions, such as sweat, saliva, sputum, nasal mucus, vomit, urine, or feces, on them.  Wear gloves when handling laundry from the patient.  Read and follow directions on labels of laundry or  clothing items and detergent. In general, wash and dry with the warmest temperatures recommended on the label.  Clean all areas the individual has used often  Clean all touchable surfaces, such as counters, tabletops, doorknobs, bathroom fixtures, toilets, phones, keyboards, tablets, and bedside tables, every day. Also, clean any surfaces that may have blood, body fluids, and/or secretions or excretions on them.  Wear gloves when cleaning surfaces the patient has come in contact with.  Use a diluted bleach solution (e.g., dilute bleach with 1 part bleach and 10 parts water) or a household disinfectant with a label that says EPA-registered for coronaviruses. To make a bleach solution at home, add 1 tablespoon of bleach to 1 quart (4 cups) of water. For a larger supply, add  cup of bleach to 1 gallon (16 cups) of water.  Read labels of cleaning products and follow recommendations provided on product labels. Labels contain instructions for safe and effective use of the cleaning product including precautions you should take when applying the product, such as wearing gloves or eye protection and making sure you have good ventilation during use of the product.  Remove gloves and wash hands immediately after cleaning.  Monitor yourself for signs and symptoms of illness Caregivers and household members are considered close contacts, should monitor their health, and will be asked to limit movement outside of the home to the extent possible. Follow the monitoring steps for close contacts listed on the symptom monitoring form.

## 2018-11-30 NOTE — Telephone Encounter (Signed)
-----   Message from Lesleigh Noe, MD sent at 11/30/2018 12:53 PM EDT ----- Close contact exposure to covid-19 Sore throat, fatigue

## 2018-11-30 NOTE — Telephone Encounter (Signed)
Patient wanted to let you know that her Metformin XR 750 mg has been recalled but pharmacy said not to stop taking it until her doctor advises her otherwise. Patient tried to call last week but could not get through. Please advise on how to proceed. Thank you CB: [phone]

## 2018-12-01 ENCOUNTER — Other Ambulatory Visit: Payer: Self-pay

## 2018-12-01 DIAGNOSIS — Z20822 Contact with and (suspected) exposure to covid-19: Secondary | ICD-10-CM

## 2018-12-01 NOTE — Telephone Encounter (Signed)
Patient advised of below  

## 2018-12-01 NOTE — Telephone Encounter (Signed)
M, can you please let her know that not all Metformin ER lots have been recalled and she may need to check with another pharmacy if they have a lot that has not been recalled.  It is not the metformin itself but some contaminants and from what I can see on the FDA website, only few pharmaceutical companies had to retract the labs from the market.  We can try to send this to another pharmacy but she needs to check with the pharmacist first before picking it up.

## 2018-12-05 LAB — NOVEL CORONAVIRUS, NAA: SARS-CoV-2, NAA: NOT DETECTED

## 2019-03-23 ENCOUNTER — Other Ambulatory Visit: Payer: Self-pay | Admitting: Internal Medicine

## 2019-04-08 ENCOUNTER — Other Ambulatory Visit: Payer: Self-pay | Admitting: Internal Medicine

## 2019-04-11 ENCOUNTER — Encounter: Payer: Self-pay | Admitting: Family Medicine

## 2019-04-11 ENCOUNTER — Other Ambulatory Visit: Payer: Self-pay

## 2019-04-11 ENCOUNTER — Ambulatory Visit (INDEPENDENT_AMBULATORY_CARE_PROVIDER_SITE_OTHER): Payer: Managed Care, Other (non HMO) | Admitting: Family Medicine

## 2019-04-11 VITALS — BP 147/97 | HR 87 | Ht 62.0 in | Wt 360.0 lb

## 2019-04-11 DIAGNOSIS — Z20822 Contact with and (suspected) exposure to covid-19: Secondary | ICD-10-CM

## 2019-04-11 DIAGNOSIS — Z6841 Body Mass Index (BMI) 40.0 and over, adult: Secondary | ICD-10-CM

## 2019-04-11 DIAGNOSIS — R05 Cough: Secondary | ICD-10-CM | POA: Diagnosis not present

## 2019-04-11 DIAGNOSIS — J309 Allergic rhinitis, unspecified: Secondary | ICD-10-CM | POA: Diagnosis not present

## 2019-04-11 DIAGNOSIS — R059 Cough, unspecified: Secondary | ICD-10-CM | POA: Insufficient documentation

## 2019-04-11 NOTE — Assessment & Plan Note (Signed)
Cough with ST and PNdrainage, had Covid19 test done this morning results pending. Doubt influenza or strep pharyngitis given no fever. Discussed suspect likely allergic rhinitis flare - treat accordingly with restarting flonase as well as supportive care reviewed (honey with lemon, herbal tea, tylenol/ibuprofen, nasal saline irrigation, allergen avoidance, etc). Recommend out of work pending test results. Discussed self isolation if covid positive with return to work parameters. Letter for work provided today.

## 2019-04-11 NOTE — Progress Notes (Signed)
Virtual visit completed through [facility]. Due to national recommendations of social distancing due to COVID-19, a virtual visit is felt to be most appropriate for this patient at this time. Reviewed limitations of a virtual visit.   Patient location: home Provider location: [place] at [facility], office If any vitals were documented, they were collected by patient at home unless specified below.    BP (!) 147/97   Pulse 87   Ht 5\' 2"  (1.575 m)   Wt (!) 360 lb (163.3 kg)   LMP 03/12/2019   BMI 65.84 kg/m    CC: cough Subjective:    Patient ID: Linda Welch, female    DOB: 03-23-1981, 38 y.o.   MRN: [id number]  HPI: Linda Welch is a 38 y.o. female presenting on 04/11/2019 for Cough (C/o cough, postnasal drip, sore throat and bilateral ear pain when swallowing.  Denies any fever, SOB or body aches.  Sxs started last night.  Took zinc.  Tested today for [facility]-19 at [facility]. )   6yo son felt sick yesterday afternoon, by the evening she started feeling bad as well. Husband was sick last week with a cough. They were at [facility] all day Tuesday.   Describes 1d h/o productive cough, ST, PND. Bilateral earache with swallowing. Slight HA last evening. Watery eyes as well. She did walk outside near her pecan tree, does have h/o seasonal allergies worse at this time of year. She is allergic to mold. Not currently using flonase but does take singulair and zyrtec regularly.   No fevers/chills, no body aches, dyspnea, no loss of taste or smell, abd pain, nausea/vomiting, diarrhea, or tooth pain.   Has treated with zinc and advil so far.   She did get Covid test this morning.      Relevant past medical, surgical, family and social history reviewed and updated as indicated. Interim medical history since our last visit reviewed. Allergies and medications reviewed and updated. Outpatient Medications Prior to Visit  Medication Sig Dispense Refill  . cetirizine (ZYRTEC) 10 MG tablet Take 10 mg by  mouth daily.     . Cholecalciferol (VITAMIN D3) 5000 units TABS Take 5,000 Units by mouth daily.    . fluticasone (FLONASE) 50 MCG/ACT nasal spray Place 2 sprays into both nostrils daily.    . metFORMIN (GLUCOPHAGE-XR) 750 MG 24 hr tablet TAKE ONE TABLET BY MOUTH EVERY NIGHT AT BEDTIME 90 tablet 1  . metoprolol succinate (TOPROL-XL) 100 MG 24 hr tablet Take 100 mg by mouth daily. Take with or immediately following a meal.    . montelukast (SINGULAIR) 10 MG tablet TAKE ONE TABLET BY MOUTH DAILY 90 tablet 2  . nystatin-triamcinolone ointment (MYCOLOG) Apply 1 application topically 2 (two) times daily. 30 g 0  . sertraline (ZOLOFT) 100 MG tablet Take 1 tablet (100 mg total) by mouth daily. 90 tablet 3  . SYNTHROID 175 MCG tablet TAKE ONE TABLET BY MOUTH DAILY 90 tablet 1   No facility-administered medications prior to visit.      Per HPI unless specifically indicated in ROS section below Review of Systems Objective:    BP (!) 147/97   Pulse 87   Ht 5\' 2"  (1.575 m)   Wt (!) 360 lb (163.3 kg)   LMP 03/12/2019   BMI 65.84 kg/m   Wt Readings from Last 3 Encounters:  04/11/19 (!) 360 lb (163.3 kg)  04/07/18 (!) 358 lb (162.4 kg)  07/20/17 (!) 361 lb 6.4 oz (163.9 kg)  Physical exam: Gen: alert, NAD, not ill appearing but tired appearing HEENT: evident congestion/mild hoarseness Pulm: speaks in complete sentences without increased work of breathing Psych: normal mood, normal thought content      Results for orders placed or performed in visit on 12/01/18  Novel Coronavirus, NAA ([facility])  Result Value Ref Range   SARS-CoV-2, NAA Not Detected Not Detected   Assessment & Plan:   Problem List Items Addressed This Visit    Cough - Primary    Cough with ST and PNdrainage, had Covid19 test done this morning results pending. Doubt influenza or strep pharyngitis given no fever. Discussed suspect likely allergic rhinitis flare - treat accordingly with restarting flonase as well as  supportive care reviewed (honey with lemon, herbal tea, tylenol/ibuprofen, nasal saline irrigation, allergen avoidance, etc). Recommend out of work pending test results. Discussed self isolation if covid positive with return to work parameters. Letter for work provided today.       Chronic allergic rhinitis   BMI 60.0-69.9, adult (HCC)       No orders of the defined types were placed in this encounter.  No orders of the defined types were placed in this encounter.   I discussed the assessment and treatment plan with the patient. The patient was provided an opportunity to ask questions and all were answered. The patient agreed with the plan and demonstrated an understanding of the instructions. The patient was advised to call back or seek an in-person evaluation if the symptoms worsen or if the condition fails to improve as anticipated.  Follow up plan: No follow-ups on file.  Ria Bush, MD

## 2019-04-12 LAB — INPATIENT

## 2019-04-12 LAB — NOVEL CORONAVIRUS, NAA: SARS-CoV-2, NAA: NOT DETECTED

## 2019-04-14 ENCOUNTER — Encounter: Payer: Self-pay | Admitting: Family Medicine

## 2019-04-15 ENCOUNTER — Encounter: Payer: Self-pay | Admitting: Family Medicine

## 2019-04-15 MED ORDER — FLUCONAZOLE 150 MG PO TABS: 150.0000 mg | ORAL_TABLET | Freq: Once | ORAL | 0 refills | Status: AC

## 2019-04-15 MED ORDER — AMOXICILLIN-POT CLAVULANATE 875-125 MG PO TABS: 1.0000 | ORAL_TABLET | Freq: Two times a day (BID) | ORAL | 0 refills | Status: AC

## 2019-04-15 NOTE — Telephone Encounter (Signed)
Spoke with patient. Worsening L earache and sinus pain. In h/o recurrent L ear infections. Will treat as such with augmentin. requests diflucan as well. Discussed flonase and astelin for nasal congestion

## 2019-04-15 NOTE — Telephone Encounter (Signed)
See other mychart note.

## 2019-04-17 ENCOUNTER — Other Ambulatory Visit: Payer: Self-pay | Admitting: Family Medicine

## 2019-04-26 ENCOUNTER — Ambulatory Visit (INDEPENDENT_AMBULATORY_CARE_PROVIDER_SITE_OTHER): Payer: Managed Care, Other (non HMO) | Admitting: Family Medicine

## 2019-04-26 ENCOUNTER — Other Ambulatory Visit: Payer: Self-pay

## 2019-04-26 ENCOUNTER — Encounter: Payer: Self-pay | Admitting: Family Medicine

## 2019-04-26 VITALS — BP 136/70 | HR 82 | Temp 98.2°F | Ht 62.0 in | Wt 363.2 lb

## 2019-04-26 DIAGNOSIS — E282 Polycystic ovarian syndrome: Secondary | ICD-10-CM | POA: Diagnosis not present

## 2019-04-26 DIAGNOSIS — E559 Vitamin D deficiency, unspecified: Secondary | ICD-10-CM | POA: Diagnosis not present

## 2019-04-26 DIAGNOSIS — K2100 Gastro-esophageal reflux disease with esophagitis, without bleeding: Secondary | ICD-10-CM

## 2019-04-26 DIAGNOSIS — Z0001 Encounter for general adult medical examination with abnormal findings: Secondary | ICD-10-CM | POA: Diagnosis not present

## 2019-04-26 DIAGNOSIS — E785 Hyperlipidemia, unspecified: Secondary | ICD-10-CM | POA: Diagnosis not present

## 2019-04-26 DIAGNOSIS — R059 Cough, unspecified: Secondary | ICD-10-CM

## 2019-04-26 DIAGNOSIS — H9202 Otalgia, left ear: Secondary | ICD-10-CM | POA: Insufficient documentation

## 2019-04-26 DIAGNOSIS — E890 Postprocedural hypothyroidism: Secondary | ICD-10-CM

## 2019-04-26 DIAGNOSIS — E538 Deficiency of other specified B group vitamins: Secondary | ICD-10-CM | POA: Diagnosis not present

## 2019-04-26 DIAGNOSIS — Z6841 Body Mass Index (BMI) 40.0 and over, adult: Secondary | ICD-10-CM

## 2019-04-26 DIAGNOSIS — R7303 Prediabetes: Secondary | ICD-10-CM | POA: Diagnosis not present

## 2019-04-26 DIAGNOSIS — Z6379 Other stressful life events affecting family and household: Secondary | ICD-10-CM

## 2019-04-26 DIAGNOSIS — F331 Major depressive disorder, recurrent, moderate: Secondary | ICD-10-CM

## 2019-04-26 DIAGNOSIS — G4733 Obstructive sleep apnea (adult) (pediatric): Secondary | ICD-10-CM

## 2019-04-26 DIAGNOSIS — R05 Cough: Secondary | ICD-10-CM

## 2019-04-26 DIAGNOSIS — Z9989 Dependence on other enabling machines and devices: Secondary | ICD-10-CM

## 2019-04-26 MED ORDER — AZELASTINE HCL 0.1 % NA SOLN: 1.0000 | Freq: Two times a day (BID) | NASAL | 11 refills | Status: DC

## 2019-04-26 MED ORDER — SERTRALINE HCL 100 MG PO TABS: 150.0000 mg | ORAL_TABLET | Freq: Every day | ORAL | 3 refills | Status: DC

## 2019-04-26 MED ORDER — METFORMIN HCL ER 750 MG PO TB24: 750.0000 mg | ORAL_TABLET | Freq: Every day | ORAL | 3 refills | Status: DC

## 2019-04-26 MED ORDER — MONTELUKAST SODIUM 10 MG PO TABS: 10.0000 mg | ORAL_TABLET | Freq: Every day | ORAL | 3 refills | Status: DC

## 2019-04-26 MED ORDER — FLUTICASONE PROPIONATE 50 MCG/ACT NA SUSP: 2.0000 | Freq: Every day | NASAL | Status: DC

## 2019-04-26 NOTE — Assessment & Plan Note (Signed)
Compliant with CPAP 

## 2019-04-26 NOTE — Assessment & Plan Note (Addendum)
Continue metformin. Appreciate endo care.

## 2019-04-26 NOTE — Assessment & Plan Note (Addendum)
Continue thyroid replacement. Appreciate endo care.

## 2019-04-26 NOTE — Progress Notes (Signed)
This visit was conducted in person.  BP 136/70 (BP Location: Left Arm, Patient Position: Sitting, Cuff Size: Large) Comment (Cuff Size): Thigh   Pulse 82    Temp 98.2 F (36.8 C) (Temporal)    Ht 5\' 2"  (1.575 m)    Wt (!) 363 lb 3 oz (164.7 kg)    LMP 04/21/2019    SpO2 98%    BMI 66.43 kg/m    CC: CPE Subjective:    Patient ID: Linda Welch, female    DOB: [birth year], 38 y.o.   MRN: [id number]  HPI: Linda Welch is a 38 y.o. female presenting on 04/26/2019 for Annual Exam   Difficult year. Husband to start dialysis (T1DM). Mother had stroke, now in nursing home, with vascular dementia. Niece recently found to have stage 4 cancer.   See prior note for details. Seen last month with respiratory symptoms and R earache, covid tested negative, treated for presumed ear infection with augmentin course. Endorses chronic clearing throat sensation at upper chest. Ongoing PNDrainage.   Previously on prilosec but not recently.  On metformin for PCOS.   Known bad allergy to mold. Allergy shots previously didn't help  Ongoing severe fatigue and lack of energy as well as oversleeping- attributes to worsening depression. Situational depression from difficult year.   Preventative: GYN - Dr Kenton Kingfisher at [facility].All normal pap smears in the past. Overdue for f/u Flu shot yearly Tdap 2013, 2014  Prevnar 2015 Pneumovax 12/2018 Seat belt use discussed.  Sunscreen use discussed. Changing mole on left back. Fmhx skin cancer.  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 Interim medical history since our last visit  reviewed. Allergies and medications reviewed and updated. Outpatient Medications Prior to Visit  Medication Sig Dispense Refill   cetirizine (ZYRTEC) 10 MG tablet Take 10 mg by mouth daily.      Cholecalciferol (VITAMIN D3) 5000 units TABS Take 5,000 Units by mouth daily.     metoprolol succinate (TOPROL-XL) 100 MG 24 hr tablet Take 100 mg by mouth daily. Take with or immediately following a meal.     nystatin-triamcinolone ointment (MYCOLOG) Apply 1 application topically 2 (two) times daily. 30 g 0   SYNTHROID 175 MCG tablet TAKE ONE TABLET BY MOUTH DAILY 90 tablet 1   amoxicillin-clavulanate (AUGMENTIN) 875-125 MG tablet Take 1 tablet by mouth 2 (two) times daily. For 10 days     azelastine (ASTELIN) 0.1 % nasal spray Place 1 spray into both nostrils 2 (two) times daily.     fluticasone (FLONASE) 50 MCG/ACT nasal spray Place 2 sprays into both nostrils daily.     metFORMIN (GLUCOPHAGE-XR) 750 MG 24 hr tablet TAKE ONE TABLET BY MOUTH EVERY NIGHT AT BEDTIME 90 tablet 1   montelukast (SINGULAIR) 10 MG tablet TAKE ONE TABLET BY MOUTH DAILY 90 tablet 2   sertraline (ZOLOFT) 100 MG tablet TAKE ONE TABLET BY MOUTH DAILY 90 tablet 0   No facility-administered medications prior to visit.      Per HPI unless specifically indicated in ROS section below Review of Systems  Constitutional: Positive for fever. Negative  for activity change, appetite change, chills, fatigue and unexpected weight change.  HENT: Negative for hearing loss.   Eyes: Positive for visual disturbance.  Respiratory: Negative for cough, chest tightness, shortness of breath and wheezing.   Cardiovascular: Positive for palpitations. Negative for chest pain and leg swelling.  Gastrointestinal: Positive for diarrhea (metformin related). Negative for abdominal distention, abdominal pain, blood in stool, constipation, nausea and vomiting.  Genitourinary: Negative for difficulty urinating and hematuria.  Musculoskeletal:  Negative for arthralgias, myalgias and neck pain.  Skin: Negative for rash.  Neurological: Positive for dizziness (recent vertigo) and headaches (sinus headache). Negative for seizures and syncope.  Hematological: Negative for adenopathy. Does not bruise/bleed easily.  Psychiatric/Behavioral: Negative for dysphoric mood. The patient is not nervous/anxious.    Objective:    BP 136/70 (BP Location: Left Arm, Patient Position: Sitting, Cuff Size: Large) Comment (Cuff Size): Thigh   Pulse 82    Temp 98.2 F (36.8 C) (Temporal)    Ht 5\' 2"  (1.575 m)    Wt (!) 363 lb 3 oz (164.7 kg)    LMP 04/21/2019    SpO2 98%    BMI 66.43 kg/m   Wt Readings from Last 3 Encounters:  04/26/19 (!) 363 lb 3 oz (164.7 kg)  04/11/19 (!) 360 lb (163.3 kg)  04/07/18 (!) 358 lb (162.4 kg)    Physical Exam Vitals signs and nursing note reviewed.  Constitutional:      General: She is not in acute distress.    Appearance: Normal appearance. She is well-developed. She is obese. She is not ill-appearing.  HENT:     Head: Normocephalic and atraumatic.     Right Ear: Hearing, tympanic membrane, ear canal and external ear normal.     Left Ear: Hearing, tympanic membrane, ear canal and external ear normal.     Nose: Nose normal.     Mouth/Throat:     Mouth: Mucous membranes are moist.     Pharynx: Oropharynx is clear. Uvula midline. No posterior oropharyngeal erythema.  Eyes:     General: No scleral icterus.    Conjunctiva/sclera: Conjunctivae normal.     Pupils: Pupils are equal, round, and reactive to light.  Neck:     Musculoskeletal: Normal range of motion and neck supple.  Cardiovascular:     Rate and Rhythm: Normal rate and regular rhythm.     Pulses:          Radial pulses are 2+ on the right side and 2+ on the left side.     Heart sounds: Normal heart sounds. No murmur.  Pulmonary:     Effort: Pulmonary effort is normal. No respiratory distress.     Breath sounds: Normal breath sounds. No wheezing or  rales.  Abdominal:     General: Bowel sounds are normal. There is no distension.     Palpations: Abdomen is soft. There is no mass.     Tenderness: There is no abdominal tenderness. There is no guarding or rebound.  Musculoskeletal: Normal range of motion.  Lymphadenopathy:     Cervical: No cervical adenopathy.  Skin:    General: Skin is warm and dry.     Findings: No rash.  Neurological:     Mental Status: She is alert and oriented to person, place, and time.     Comments: CN grossly intact, station and gait intact  Psychiatric:        Behavior: Behavior normal.        Thought Content: Thought content  normal.        Judgment: Judgment normal.       Results for orders placed or performed in visit on 04/11/19  Novel Coronavirus, NAA ([facility])   Specimen: Nasopharyngeal(NP) swabs in vial transport medium   NASOPHARYNGE  TESTING  Result Value Ref Range   SARS-CoV-2, NAA Not Detected Not Detected  Inpatient  Result Value Ref Range   Inpatient Comment    Depression screen [facility] 2/9 04/26/2019 04/07/2018 04/07/2018 02/15/2016  Decreased Interest 2 0 0 0  Down, Depressed, Hopeless 2 1 - 0  PHQ - 2 Score 4 1 0 0  Altered sleeping 3 2 - -  Tired, decreased energy 3 3 - -  Change in appetite 2 1 - -  Feeling bad or failure about yourself  2 0 - -  Trouble concentrating 2 3 - -  Moving slowly or fidgety/restless 1 1 - -  Suicidal thoughts 0 0 - -  PHQ-9 Score 17 11 - -    GAD 7 : Generalized Anxiety Score 04/26/2019 04/07/2018  Nervous, Anxious, on Edge 3 3  Control/stop worrying 2 2  Worry too much - different things 2 3  Trouble relaxing 1 2  Restless 1 2  Easily annoyed or irritable 3 2  Afraid - awful might happen 3 3  Total GAD 7 Score 15 17   Assessment & Plan:  This visit occurred during the SARS-CoV-2 public health emergency.  Safety protocols were in place, including screening questions prior to the visit, additional usage of staff PPE, and extensive cleaning of exam  room while observing appropriate contact time as indicated for disinfecting solutions.   Problem List Items Addressed This Visit    Vitamin D deficiency    Continue 5000 IU daily.       Relevant Orders   vit d   Stress due to illness of family member    Significant stressors this year reviewed, support provided.       Reflux esophagitis    H/o this. Suggested nightly pepcid for chronic nagging cough/throat clearing.      Prediabetes    Update A1c.       Relevant Orders   Hemoglobin A1c   Postsurgical hypothyroidism    Continue thyroid replacement. Appreciate endo care.       PCOS (polycystic ovarian syndrome)    Continue metformin. Appreciate endo care.       Relevant Orders   CBC with Differential   OSA on CPAP    Compliant with CPAP.       MDD (major depressive disorder), recurrent episode, moderate (HCC)    Struggling, difficult year - will increase sertraline to 150mg  daily. Discussed counseling option, declines at this time.       Relevant Medications   sertraline (ZOLOFT) 100 MG tablet   Left ear pain    Resolving after augmentin course. Treated resumed AOM      Hyperlipidemia    Update FLP off medication.  The 10-year ASCVD risk score Mikey Bussing DC Brooke Bonito., et al., 2013) is: 2.3%   Values used to calculate the score:     Age: 58 years     Sex: Female     Is Non-Hispanic African American: No     Diabetic: Yes     Tobacco smoker: No     Systolic Blood Pressure: XX123456 mmHg     Is BP treated: No     HDL Cholesterol: 50 mg/dL     Total Cholesterol: 246  mg/dL       Relevant Orders   Lipid panel   Comprehensive metabolic panel   Encounter for general adult medical examination with abnormal findings - Primary    Preventative protocols reviewed and updated unless pt declined. Discussed healthy diet and lifestyle.       Cough    Mild intermittent nagging cough with PNDrainage present after recent illness, covid tested negative.       BMI 60.0-69.9, adult  (HCC)    Encouraged healthy diet and lifestyle changes to affect sustainable weight loss. Not good saxenda candidate due to personal history thyroid cancer. Consider contrave.       Relevant Medications   metFORMIN (GLUCOPHAGE-XR) 750 MG 24 hr tablet    Other Visit Diagnoses    Low serum vitamin B12       Relevant Orders   Vitamin B12       Meds ordered this encounter  Medications   azelastine (ASTELIN) 0.1 % nasal spray    Sig: Place 1 spray into both nostrils 2 (two) times daily.    Dispense:  30 mL    Refill:  11   montelukast (SINGULAIR) 10 MG tablet    Sig: Take 1 tablet (10 mg total) by mouth daily.    Dispense:  90 tablet    Refill:  3   fluticasone (FLONASE) 50 MCG/ACT nasal spray    Sig: Place 2 sprays into both nostrils daily.   metFORMIN (GLUCOPHAGE-XR) 750 MG 24 hr tablet    Sig: Take 1 tablet (750 mg total) by mouth at bedtime.    Dispense:  90 tablet    Refill:  3   sertraline (ZOLOFT) 100 MG tablet    Sig: Take 1.5 tablets (150 mg total) by mouth daily.    Dispense:  135 tablet    Refill:  3   Orders Placed This Encounter  Procedures   Lipid panel   Comprehensive metabolic panel   Hemoglobin A1c   CBC with Differential   vit d   Vitamin B12   Patient instructions: Try nightly pepcid 20mg  for constant throat clearing feeling.  Labs today. Trial higher sertraline dose 150mg  daily, will take 3-4 weeks to take effect.  Return as needed or in 1 year for next physical.  You are doing well today.   Follow up plan: Return in about 1 year (around 04/25/2020) for annual exam, prior fasting for blood work.  Ria Bush, MD

## 2019-04-26 NOTE — Assessment & Plan Note (Addendum)
Mild intermittent nagging cough with PNDrainage present after recent illness, covid tested negative.

## 2019-04-26 NOTE — Assessment & Plan Note (Addendum)
Encouraged healthy diet and lifestyle changes to affect sustainable weight loss. Not good saxenda candidate due to personal history thyroid cancer. Consider contrave.

## 2019-04-26 NOTE — Assessment & Plan Note (Signed)
H/o this. Suggested nightly pepcid for chronic nagging cough/throat clearing.

## 2019-04-26 NOTE — Assessment & Plan Note (Signed)
Struggling, difficult year - will increase sertraline to 150mg  daily. Discussed counseling option, declines at this time.

## 2019-04-26 NOTE — Assessment & Plan Note (Addendum)
Resolving after augmentin course. Treated resumed AOM

## 2019-04-26 NOTE — Assessment & Plan Note (Addendum)
Update FLP off medication.  The 10-year ASCVD risk score Mikey Bussing DC Brooke Bonito., et al., 2013) is: 2.3%   Values used to calculate the score:     Age: 38 years     Sex: Female     Is Non-Hispanic African American: No     Diabetic: Yes     Tobacco smoker: No     Systolic Blood Pressure: [id number] mmHg     Is BP treated: No     HDL Cholesterol: 50 mg/dL     Total Cholesterol: 246 mg/dL

## 2019-04-26 NOTE — Assessment & Plan Note (Addendum)
Update A1c ?

## 2019-04-26 NOTE — Assessment & Plan Note (Signed)
Preventative protocols reviewed and updated unless pt declined. Discussed healthy diet and lifestyle.  

## 2019-04-26 NOTE — Assessment & Plan Note (Signed)
Continue 5000 IU daily.  

## 2019-04-26 NOTE — Patient Instructions (Addendum)
Try nightly pepcid 20mg  for constant throat clearing feeling.  Labs today. Trial higher sertraline dose 150mg  daily, will take 3-4 weeks to take effect.  Return as needed or in 1 year for next physical.  You are doing well today.   Health Maintenance, Female Adopting a healthy lifestyle and getting preventive care are important in promoting health and wellness. Ask your health care provider about:  The right schedule for you to have regular tests and exams.  Things you can do on your own to prevent diseases and keep yourself healthy. What should I know about diet, weight, and exercise? Eat a healthy diet   Eat a diet that includes plenty of vegetables, fruits, low-fat dairy products, and lean protein.  Do not eat a lot of foods that are high in solid fats, added sugars, or sodium. Maintain a healthy weight Body mass index (BMI) is used to identify weight problems. It estimates body fat based on height and weight. Your health care provider can help determine your BMI and help you achieve or maintain a healthy weight. Get regular exercise Get regular exercise. This is one of the most important things you can do for your health. Most adults should:  Exercise for at least 150 minutes each week. The exercise should increase your heart rate and make you sweat (moderate-intensity exercise).  Do strengthening exercises at least twice a week. This is in addition to the moderate-intensity exercise.  Spend less time sitting. Even light physical activity can be beneficial. Watch cholesterol and blood lipids Have your blood tested for lipids and cholesterol at 40 years of age, then have this test every 5 years. Have your cholesterol levels checked more often if:  Your lipid or cholesterol levels are high.  You are older than 40 years of age.  You are at high risk for heart disease. What should I know about cancer screening? Depending on your health history and family history, you may need  to have cancer screening at various ages. This may include screening for:  Breast cancer.  Cervical cancer.  Colorectal cancer.  Skin cancer.  Lung cancer. What should I know about heart disease, diabetes, and high blood pressure? Blood pressure and heart disease  High blood pressure causes heart disease and increases the risk of stroke. This is more likely to develop in people who have high blood pressure readings, are of African descent, or are overweight.  Have your blood pressure checked: ? Every 3-5 years if you are 61-43 years of age. ? Every year if you are 63 years old or older. Diabetes Have regular diabetes screenings. This checks your fasting blood sugar level. Have the screening done:  Once every three years after age 77 if you are at a normal weight and have a low risk for diabetes.  More often and at a younger age if you are overweight or have a high risk for diabetes. What should I know about preventing infection? Hepatitis B If you have a higher risk for hepatitis B, you should be screened for this virus. Talk with your health care provider to find out if you are at risk for hepatitis B infection. Hepatitis C Testing is recommended for:  Everyone born from [birth year] through 1967.  Anyone with known risk factors for hepatitis C. Sexually transmitted infections (STIs)  Get screened for STIs, including gonorrhea and chlamydia, if: ? You are sexually active and are younger than 40 years of age. ? You are older than 40 years of age  and your health care provider tells you that you are at risk for this type of infection. ? Your sexual activity has changed since you were last screened, and you are at increased risk for chlamydia or gonorrhea. Ask your health care provider if you are at risk.  Ask your health care provider about whether you are at high risk for HIV. Your health care provider may recommend a prescription medicine to help prevent HIV infection. If you choose  to take medicine to prevent HIV, you should first get tested for HIV. You should then be tested every 3 months for as long as you are taking the medicine. Pregnancy  If you are about to stop having your period (premenopausal) and you may become pregnant, seek counseling before you get pregnant.  Take 400 to 800 micrograms (mcg) of folic acid every day if you become pregnant.  Ask for birth control (contraception) if you want to prevent pregnancy. Osteoporosis and menopause Osteoporosis is a disease in which the bones lose minerals and strength with aging. This can result in bone fractures. If you are 47 years old or older, or if you are at risk for osteoporosis and fractures, ask your health care provider if you should:  Be screened for bone loss.  Take a calcium or vitamin D supplement to lower your risk of fractures.  Be given hormone replacement therapy (HRT) to treat symptoms of menopause. Follow these instructions at home: Lifestyle  Do not use any products that contain nicotine or tobacco, such as cigarettes, e-cigarettes, and chewing tobacco. If you need help quitting, ask your health care provider.  Do not use street drugs.  Do not share needles.  Ask your health care provider for help if you need support or information about quitting drugs. Alcohol use  Do not drink alcohol if: ? Your health care provider tells you not to drink. ? You are pregnant, may be pregnant, or are planning to become pregnant.  If you drink alcohol: ? Limit how much you use to 0-1 drink a day. ? Limit intake if you are breastfeeding.  Be aware of how much alcohol is in your drink. In the [place] one drink equals one 12 oz bottle of beer (355 mL), one 5 oz glass of wine (148 mL), or one 1 oz glass of hard liquor (44 mL). General instructions  Schedule regular health, dental, and eye exams.  Stay current with your vaccines.  Tell your health care provider if: ? You often feel depressed. ? You  have ever been abused or do not feel safe at home. Summary  Adopting a healthy lifestyle and getting preventive care are important in promoting health and wellness.  Follow your health care provider's instructions about healthy diet, exercising, and getting tested or screened for diseases.  Follow your health care provider's instructions on monitoring your cholesterol and blood pressure. This information is not intended to replace advice given to you by your health care provider. Make sure you discuss any questions you have with your health care provider. Document Released: 11/25/2010 Document Revised: 05/05/2018 Document Reviewed: 05/05/2018 Elsevier Patient Education  2020 [employer].

## 2019-04-26 NOTE — Assessment & Plan Note (Signed)
Significant stressors this year reviewed, support provided.

## 2019-04-27 LAB — LIPID PANEL
Cholesterol: 254 mg/dL — ABNORMAL HIGH (ref 0–200)
HDL: 47.2 mg/dL (ref >39.00)
LDL Cholesterol: 178 mg/dL — ABNORMAL HIGH (ref 0–99)
NonHDL: 206.33
Total CHOL/HDL Ratio: 5
Triglycerides: 143 mg/dL (ref 0.0–149.0)
VLDL: 28.6 mg/dL (ref 0.0–40.0)

## 2019-04-27 LAB — CBC WITH DIFFERENTIAL/PLATELET
Basophils Absolute: 0.1 10*3/uL (ref 0.0–0.1)
Basophils Relative: 1 % (ref 0.0–3.0)
Eosinophils Absolute: 0.2 10*3/uL (ref 0.0–0.7)
Eosinophils Relative: 1.5 % (ref 0.0–5.0)
HCT: 41.3 % (ref 36.0–46.0)
Hemoglobin: 13.8 g/dL (ref 12.0–15.0)
Lymphocytes Relative: 33 % (ref 12.0–46.0)
Lymphs Abs: 3.3 10*3/uL (ref 0.7–4.0)
MCHC: 33.5 g/dL (ref 30.0–36.0)
MCV: 89 fl (ref 78.0–100.0)
Monocytes Absolute: 0.5 10*3/uL (ref 0.1–1.0)
Monocytes Relative: 5.4 % (ref 3.0–12.0)
Neutro Abs: 5.9 10*3/uL (ref 1.4–7.7)
Neutrophils Relative %: 59.1 % (ref 43.0–77.0)
Platelets: 262 10*3/uL (ref 150.0–400.0)
RBC: 4.64 Mil/uL (ref 3.87–5.11)
RDW: 14.4 % (ref 11.5–15.5)
WBC: 9.9 10*3/uL (ref 4.0–10.5)

## 2019-04-27 LAB — COMPREHENSIVE METABOLIC PANEL
ALT: 15 U/L (ref 0–35)
AST: 18 U/L (ref 0–37)
Albumin: 4.3 g/dL (ref 3.5–5.2)
Alkaline Phosphatase: 87 U/L (ref 39–117)
BUN: 13 mg/dL (ref 6–23)
CO2: 29 mEq/L (ref 19–32)
Calcium: 9.2 mg/dL (ref 8.4–10.5)
Chloride: 101 mEq/L (ref 96–112)
Creatinine, Ser: 0.65 mg/dL (ref 0.40–1.20)
GFR: 100.74 mL/min (ref >60.00)
Glucose, Bld: 91 mg/dL (ref 70–99)
Potassium: 4.4 mEq/L (ref 3.5–5.1)
Sodium: 138 mEq/L (ref 135–145)
Total Bilirubin: 0.2 mg/dL (ref 0.2–1.2)
Total Protein: 7.8 g/dL (ref 6.0–8.3)

## 2019-04-27 LAB — HEMOGLOBIN A1C: Hgb A1c MFr Bld: 5.9 % (ref 4.6–6.5)

## 2019-04-27 LAB — VITAMIN B12: Vitamin B-12: 307 pg/mL (ref 211–911)

## 2019-04-27 LAB — VITAMIN D 25 HYDROXY (VIT D DEFICIENCY, FRACTURES): VITD: 32.28 ng/mL (ref 30.00–100.00)

## 2019-04-29 ENCOUNTER — Other Ambulatory Visit: Payer: Self-pay | Admitting: Family Medicine

## 2019-04-29 MED ORDER — B-12 1000 MCG SL SUBL: 1.0000 | SUBLINGUAL_TABLET | Freq: Every day | SUBLINGUAL | Status: DC

## 2019-05-11 ENCOUNTER — Encounter: Payer: Self-pay | Admitting: Family Medicine

## 2019-05-11 MED ORDER — IPRATROPIUM BROMIDE 0.03 % NA SOLN: 1.0000 | Freq: Two times a day (BID) | NASAL | 1 refills | Status: DC

## 2019-08-08 ENCOUNTER — Encounter: Payer: Self-pay | Admitting: Family Medicine

## 2019-08-08 ENCOUNTER — Ambulatory Visit: Payer: Managed Care, Other (non HMO) | Admitting: Family Medicine

## 2019-08-08 ENCOUNTER — Telehealth: Payer: Self-pay

## 2019-08-08 ENCOUNTER — Other Ambulatory Visit: Payer: Self-pay

## 2019-08-08 VITALS — BP 120/84 | HR 82 | Temp 97.9°F | Ht 62.0 in | Wt 359.3 lb

## 2019-08-08 DIAGNOSIS — R1010 Upper abdominal pain, unspecified: Secondary | ICD-10-CM | POA: Diagnosis not present

## 2019-08-08 DIAGNOSIS — R1013 Epigastric pain: Secondary | ICD-10-CM | POA: Insufficient documentation

## 2019-08-08 DIAGNOSIS — G8929 Other chronic pain: Secondary | ICD-10-CM | POA: Insufficient documentation

## 2019-08-08 LAB — POC URINALSYSI DIPSTICK (AUTOMATED)
Blood, UA: NEGATIVE
Glucose, UA: NEGATIVE
Ketones, UA: NEGATIVE
Nitrite, UA: NEGATIVE
Protein, UA: POSITIVE — AB
Spec Grav, UA: >=1.03 — AB (ref 1.010–1.025)
Urobilinogen, UA: 0.2 E.U./dL
pH, UA: 5.5 (ref 5.0–8.0)

## 2019-08-08 MED ORDER — OMEPRAZOLE 40 MG PO CPDR: 40.0000 mg | DELAYED_RELEASE_CAPSULE | Freq: Every day | ORAL | 3 refills | Status: DC

## 2019-08-08 NOTE — Telephone Encounter (Signed)
[facility] Night - Client Nonclinical Telephone Record [facility] Night - Client Client Site [facility] Physician Ria Bush - MD Contact Type Call Who Is Calling Patient / Member / Family / Caregiver Caller Name Linda Welch [place] Phone Number [phone] Patient Name Linda Welch Patient DOB December 10, 1980 Call Type Message Only Information Provided Reason for Call Request for General Office Information Initial Comment Caller states she has an appt at 8:15 and over slept. Do you want her to still come in? Additional Comment Disp. Time Disposition Final User 08/08/2019 7:05:47 AM General Information Provided Yes Artis Flock Call Closed By: Artis Flock Transaction Date/Time: 08/08/2019 7:04:19 AM (ET)

## 2019-08-08 NOTE — Patient Instructions (Addendum)
We will schedule you for abdominal ultrasound.  Urinalysis today.  Pass by lab for stool test for H pylori.  After you do the test, start prilosec (omeprazole) 40mg  daily for 3 weeks.  Update me with effect after 3 weeks.

## 2019-08-08 NOTE — Progress Notes (Signed)
This visit was conducted in person.  BP 120/84 (BP Location: Left Arm, Patient Position: Sitting, Cuff Size: Large)   Pulse 82   Temp 97.9 F (36.6 C) (Temporal)   Ht 5\' 2"  (1.575 m)   Wt (!) 359 lb 5 oz (163 kg)   LMP 07/09/2019   SpO2 97%   BMI 65.72 kg/m    CC: upper abd discomfort Subjective:    Patient ID: Linda Welch, female    DOB: January 23, 1981, 39 y.o.   MRN: [id number]  HPI: Linda Welch is a 39 y.o. female presenting on 08/08/2019 for Abdominal Pain (C/o upper abd pain in epigastric region.  Also, c/o severe bloating after each meal and diarrhea.  Abd pain started about 6 mos ago, worsened in last 2-3 mos. Denies nausea/vomiting. Diarrheal due to metformin. )   6 mo h/o upper abdominal pain associated with increased bloating after meals. Describes dull ache/pressure that radiates to lower abdomen. Pain can last weeks. Progressively worsening over the last 2-3 months. Some chronic diarrhea (3-4 loose stools/day) - metformin contributes. Worse with eggs, salad, greasy fried foods. Tomatoes cause acne and heartburn. Does ok with spicy foods. Avoids milk due to diarrhea, bloating, gassiness. Known IBS - but this is different.   No fevers/chills, nausea/vomiting, constipation, urinary symptoms. No dysphagia, early satiety, or GERD.   She has been taking pepcid 20mg  at night time.  No frequent heartburn.  Questionable h/o reflux esophagitis although hasn't had EGD.  No h/o peptic ulcer.   OSA on CPAP regularly.      Relevant past medical, surgical, family and social history reviewed and updated as indicated. Interim medical history since our last visit reviewed. Allergies and medications reviewed and updated. Outpatient Medications Prior to Visit  Medication Sig Dispense Refill  . cetirizine (ZYRTEC) 10 MG tablet Take 10 mg by mouth daily.     . Cholecalciferol (VITAMIN D3) 5000 units TABS Take 5,000 Units by mouth daily.    . Cyanocobalamin (B-12) 1000 MCG SUBL Place  1 tablet under the tongue daily.    . fluticasone (FLONASE) 50 MCG/ACT nasal spray Place 2 sprays into both nostrils daily.    . metFORMIN (GLUCOPHAGE-XR) 750 MG 24 hr tablet Take 1 tablet (750 mg total) by mouth at bedtime. 90 tablet 3  . metoprolol succinate (TOPROL-XL) 100 MG 24 hr tablet Take 100 mg by mouth daily. Take with or immediately following a meal.    . montelukast (SINGULAIR) 10 MG tablet Take 1 tablet (10 mg total) by mouth daily. 90 tablet 3  . nystatin-triamcinolone ointment (MYCOLOG) Apply 1 application topically 2 (two) times daily. 30 g 0  . sertraline (ZOLOFT) 100 MG tablet Take 1.5 tablets (150 mg total) by mouth daily. 135 tablet 3  . SYNTHROID 175 MCG tablet TAKE ONE TABLET BY MOUTH DAILY 90 tablet 1  . ipratropium (ATROVENT) 0.03 % nasal spray Place 1 spray into both nostrils every 12 (twelve) hours. 30 mL 1   No facility-administered medications prior to visit.     Per HPI unless specifically indicated in ROS section below Review of Systems Objective:    BP 120/84 (BP Location: Left Arm, Patient Position: Sitting, Cuff Size: Large)   Pulse 82   Temp 97.9 F (36.6 C) (Temporal)   Ht 5\' 2"  (1.575 m)   Wt (!) 359 lb 5 oz (163 kg)   LMP 07/09/2019   SpO2 97%   BMI 65.72 kg/m   Wt Readings from Last 3 Encounters:  08/08/19 (!) 359 lb 5 oz (163 kg)  04/26/19 (!) 363 lb 3 oz (164.7 kg)  04/11/19 (!) 360 lb (163.3 kg)    Physical Exam Vitals and nursing note reviewed.  Constitutional:      Appearance: Normal appearance. She is obese. She is not ill-appearing.  Eyes:     Extraocular Movements: Extraocular movements intact.     Pupils: Pupils are equal, round, and reactive to light.  Cardiovascular:     Rate and Rhythm: Normal rate and regular rhythm.     Pulses: Normal pulses.     Heart sounds: Normal heart sounds. No murmur.  Pulmonary:     Effort: Pulmonary effort is normal. No respiratory distress.     Breath sounds: Normal breath sounds. No wheezing,  rhonchi or rales.  Abdominal:     General: Abdomen is flat. Bowel sounds are normal. There is no distension.     Palpations: Abdomen is soft. There is no mass.     Tenderness: There is abdominal tenderness (moderate) in the epigastric area. There is no right CVA tenderness, left CVA tenderness, guarding or rebound. Negative signs include Murphy's sign.     Hernia: No hernia is present.  Musculoskeletal:     Right lower leg: No edema.     Left lower leg: No edema.  Neurological:     Mental Status: She is alert.  Psychiatric:        Mood and Affect: Mood normal.       Results for orders placed or performed in visit on 04/26/19  Lipid panel  Result Value Ref Range   Cholesterol 254 (H) 0 - 200 mg/dL   Triglycerides 143.0 0.0 - 149.0 mg/dL   HDL 47.20 >39.00 mg/dL   VLDL 28.6 0.0 - 40.0 mg/dL   LDL Cholesterol 178 (H) 0 - 99 mg/dL   Total CHOL/HDL Ratio 5    NonHDL 206.33   Comprehensive metabolic panel  Result Value Ref Range   Sodium 138 135 - 145 mEq/L   Potassium 4.4 3.5 - 5.1 mEq/L   Chloride 101 96 - 112 mEq/L   CO2 29 19 - 32 mEq/L   Glucose, Bld 91 70 - 99 mg/dL   BUN 13 6 - 23 mg/dL   Creatinine, Ser 0.65 0.40 - 1.20 mg/dL   Total Bilirubin 0.2 0.2 - 1.2 mg/dL   Alkaline Phosphatase 87 39 - 117 U/L   AST 18 0 - 37 U/L   ALT 15 0 - 35 U/L   Total Protein 7.8 6.0 - 8.3 g/dL   Albumin 4.3 3.5 - 5.2 g/dL   GFR 100.74 >60.00 mL/min   Calcium 9.2 8.4 - 10.5 mg/dL  Hemoglobin A1c  Result Value Ref Range   Hgb A1c MFr Bld 5.9 4.6 - 6.5 %  CBC with Differential  Result Value Ref Range   WBC 9.9 4.0 - 10.5 K/uL   RBC 4.64 3.87 - 5.11 Mil/uL   Hemoglobin 13.8 12.0 - 15.0 g/dL   HCT 41.3 36.0 - 46.0 %   MCV 89.0 78.0 - 100.0 fl   MCHC 33.5 30.0 - 36.0 g/dL   RDW 14.4 11.5 - 15.5 %   Platelets 262.0 150.0 - 400.0 K/uL   Neutrophils Relative % 59.1 43.0 - 77.0 %   Lymphocytes Relative 33.0 12.0 - 46.0 %   Monocytes Relative 5.4 3.0 - 12.0 %   Eosinophils Relative 1.5  0.0 - 5.0 %   Basophils Relative 1.0 0.0 - 3.0 %  Neutro Abs 5.9 1.4 - 7.7 K/uL   Lymphs Abs 3.3 0.7 - 4.0 K/uL   Monocytes Absolute 0.5 0.1 - 1.0 K/uL   Eosinophils Absolute 0.2 0.0 - 0.7 K/uL   Basophils Absolute 0.1 0.0 - 0.1 K/uL  vit d  Result Value Ref Range   VITD 32.28 30.00 - 100.00 ng/mL  Vitamin B12  Result Value Ref Range   Vitamin B-12 307 211 - 911 pg/mL   Assessment & Plan:  This visit occurred during the SARS-CoV-2 public health emergency.  Safety protocols were in place, including screening questions prior to the visit, additional usage of staff PPE, and extensive cleaning of exam room while observing appropriate contact time as indicated for disinfecting solutions.   Problem List Items Addressed This Visit    Upper abdominal pain - Primary    Ongoing for at least 6 months, acutely worse the past 2-3 months. Suspicious for gallbladder etiology given greasy foods worsen symptoms. Check abd [place]. Check UA today. Will also send for H pylori testing, then have her start omeprazole in case gastritis/esophagitis contributing. Update in 3 weeks with effect, let us know sooner if any worsening.       Relevant Orders   Helicobacter pylori special antigen   US Abdomen Complete   POCT Urinalysis Dipstick (Automated)       Meds ordered this encounter  Medications  . omeprazole (PRILOSEC) 40 MG capsule    Sig: Take 1 capsule (40 mg total) by mouth daily. for 3 weeks then as needed    Dispense:  30 capsule    Refill:  3   Orders Placed This Encounter  Procedures  . Helicobacter pylori special antigen  . US Abdomen Complete    Standing Status:   Future    Standing Expiration Date:   10/07/2020    Order Specific Question:   Reason for Exam (SYMPTOM  OR DIAGNOSIS REQUIRED)    Answer:   upper abdominal pain    Order Specific Question:   Preferred imaging location?    Answer:   [facility]  . POCT Urinalysis Dipstick (Automated)    Follow up plan: Return if  symptoms worsen or fail to improve.  Ria Bush, MD

## 2019-08-08 NOTE — Assessment & Plan Note (Signed)
Ongoing for at least 6 months, acutely worse the past 2-3 months. Suspicious for gallbladder etiology given greasy foods worsen symptoms. Check abd [place]. Check UA today. Will also send for H pylori testing, then have her start omeprazole in case gastritis/esophagitis contributing. Update in 3 weeks with effect, let us know sooner if any worsening.

## 2019-08-08 NOTE — Telephone Encounter (Signed)
Noted  

## 2019-08-08 NOTE — Telephone Encounter (Signed)
appt has already been changed to 9:15 this morning.

## 2019-08-12 ENCOUNTER — Telehealth: Payer: Self-pay | Admitting: Family Medicine

## 2019-08-12 DIAGNOSIS — R1010 Upper abdominal pain, unspecified: Secondary | ICD-10-CM

## 2019-08-12 NOTE — Telephone Encounter (Signed)
Please place New Abd/US order for this patient. She wants to go to [facility]. She was scheduled at [facility] and the [place] was rude to her and she cancelled that appt that was scheduled there and now we need a New order.

## 2019-08-12 NOTE — Telephone Encounter (Signed)
New referral placed.

## 2019-08-16 ENCOUNTER — Ambulatory Visit: Payer: Managed Care, Other (non HMO)

## 2019-08-18 ENCOUNTER — Ambulatory Visit
Admission: RE | Admit: 2019-08-18 | Discharge: 2019-08-18 | Disposition: A | Discharge status: Home / Self Care | Payer: Managed Care, Other (non HMO) | Source: Ambulatory Visit | Attending: Family Medicine | Admitting: Family Medicine

## 2019-08-18 DIAGNOSIS — R1010 Upper abdominal pain, unspecified: Secondary | ICD-10-CM

## 2019-08-19 ENCOUNTER — Encounter: Payer: Self-pay | Admitting: Family Medicine

## 2019-08-19 DIAGNOSIS — K760 Fatty (change of) liver, not elsewhere classified: Secondary | ICD-10-CM | POA: Insufficient documentation

## 2019-08-21 ENCOUNTER — Ambulatory Visit: Payer: Managed Care, Other (non HMO) | Attending: Internal Medicine

## 2019-08-21 DIAGNOSIS — Z23 Encounter for immunization: Secondary | ICD-10-CM

## 2019-08-21 NOTE — Progress Notes (Signed)
   Covid-19 Vaccination Clinic  Name:  Linda Welch    MRN: [id number] DOB: 12/26/1980  08/21/2019  [name] was observed post Covid-19 immunization for 15 minutes without incident. She was provided with Vaccine Information Sheet and instruction to access the V-Safe system.   [name] was instructed to call 911 with any severe reactions post vaccine: Marland Kitchen Difficulty breathing  . Swelling of face and throat  . A fast heartbeat  . A bad rash all over body  . Dizziness and weakness   Immunizations Administered    Name Date Dose VIS Date Route   Pfizer COVID-19 Vaccine 08/21/2019  1:00 PM 0.3 mL 05/06/2019 Intramuscular   Manufacturer: [place]   Lot: [id number]   [place]: [id number]

## 2019-09-11 ENCOUNTER — Ambulatory Visit: Payer: Managed Care, Other (non HMO) | Attending: Internal Medicine

## 2019-09-11 DIAGNOSIS — Z23 Encounter for immunization: Secondary | ICD-10-CM

## 2019-09-11 NOTE — Progress Notes (Signed)
   Covid-19 Vaccination Clinic  Name:  Linda Welch    MRN: [id number] DOB: 14-Feb-1981  09/11/2019  Linda Welch was observed post Covid-19 immunization for 15 minutes without incident. She was provided with Vaccine Information Sheet and instruction to access the V-Safe system.   Linda Welch was instructed to call 911 with any severe reactions post vaccine: Marland Kitchen Difficulty breathing  . Swelling of face and throat  . A fast heartbeat  . A bad rash all over body  . Dizziness and weakness   Immunizations Administered    Name Date Dose VIS Date Route   Pfizer COVID-19 Vaccine 09/11/2019  1:02 PM 0.3 mL 05/06/2019 Intramuscular   Manufacturer: [place]   Lot: [id number]   [place]: [id number]

## 2019-10-19 ENCOUNTER — Other Ambulatory Visit: Payer: Self-pay

## 2019-10-19 MED ORDER — SYNTHROID 175 MCG PO TABS: 175.0000 ug | ORAL_TABLET | Freq: Every day | ORAL | 0 refills | Status: DC

## 2019-12-02 ENCOUNTER — Encounter: Payer: Self-pay | Admitting: Internal Medicine

## 2019-12-02 ENCOUNTER — Ambulatory Visit: Payer: Managed Care, Other (non HMO) | Admitting: Internal Medicine

## 2019-12-02 ENCOUNTER — Other Ambulatory Visit: Payer: Self-pay

## 2019-12-02 VITALS — BP 148/80 | HR 81 | Ht 62.0 in | Wt 362.4 lb

## 2019-12-02 DIAGNOSIS — E890 Postprocedural hypothyroidism: Secondary | ICD-10-CM | POA: Diagnosis not present

## 2019-12-02 DIAGNOSIS — C73 Malignant neoplasm of thyroid gland: Secondary | ICD-10-CM

## 2019-12-02 DIAGNOSIS — E559 Vitamin D deficiency, unspecified: Secondary | ICD-10-CM | POA: Diagnosis not present

## 2019-12-02 LAB — TSH: TSH: 5.52 u[IU]/mL — ABNORMAL HIGH (ref 0.35–4.50)

## 2019-12-02 LAB — T4, FREE: Free T4: 1.02 ng/dL (ref 0.60–1.60)

## 2019-12-02 NOTE — Progress Notes (Addendum)
Patient ID: Linda Welch, female   DOB: [birth year], 39 y.o.   MRN: [id number]  This visit occurred during the SARS-CoV-2 public health emergency.  Safety protocols were in place, including screening questions prior to the visit, additional usage of staff PPE, and extensive cleaning of exam room while observing appropriate contact time as indicated for disinfecting solutions.   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  Last visit 1 year and 3 months ago (virtual).  Reviewed her thyroid cancer history: - dx in in 02/2009 >> 3 cm nodule felt by pt >> seen by PCP >> referred to endo >> uptake and scan >> cold nodule >> FNA by Dr Francoise Schaumann: benign (bad experience).  - thyroid U/S 2010: 3.1 x 2.6 x 2.2 cm complex, slightly hypoechoic, nodule, situated in the mid pole of the right thyroid lobe - Thyroid ultrasound 02/06/2014: Large nodule occupying most of the right lobe measures 5.2 x 3.2 x 4.1 cm. There are some internal calcifications. It previously measured 3.1 x 2.6 x 2.2 cm based on the prior report. - FNA 80/16/5537: FOLLICULAR NEOPLASM AND/OR LESION ([facility] IV)  - Right lobectomy 05/16/2014 and completion thyroidectomy 06/01/2014 Thyroid, lobectomy, right - FOLLICULAR VARIANT OF PAPILLARY THYROID CARCINOMA WITH CAPSULAR INVASION, 4.2 CM, CONFINED WITHIN THYROIDAL TISSUE. - NO EVIDENCE OF ANGIOLYMPHATIC INVASION IDENTIFIED. - RESECTION MARGINS, NEGATIVE FOR ATYPIA OR MALIGNANCY. Microscopic Comment THYROID Specimen: Right thyroid Procedure: Right hemithyroidectomy Specimen Integrity (intact/fragmented): Intact Tumor focality: Unifocal Maximum tumor size (cm): 4.2 cm, gross description Tumor laterality: Right thyroid gland Histologic type (including subtype and/or unique features as applicable): Follicular variant of papillary thyroid carcinoma Tumor capsule:  Present Extrathyroidal extension: No Margins: Negative Lymph - Vascular invasion: Not identified Capsular invasion with degree of invasion if present: Present, complete Lymph nodes: # examined N/A; # positive; N/A TNM code: pT3 , pNX Non-neoplastic thyroid: Unremarkable. Due to the low risk of her thyroid tumor, I did not recommend I-131 remnant ablation.  01/07/2016: Neck ultrasound: Surgical changes of total thyroidectomy without evidence of residual thyroid tissue, nodularity or lymphadenopathy.  Her thyroglobulin levels are decreasing: Lab Results  Component Value Date   THYROGLB 0.2 (L) 06/22/2017   THYROGLB 0.3 (L) 12/22/2016   THYROGLB 0.6 (L) 12/24/2015   THGAB <1 06/22/2017   THGAB <1 12/22/2016   THGAB <1 12/24/2015   Pt denies: - feeling nodules in neck - hoarseness - dysphagia - choking - SOB with lying down  Hypothyroidism: Pt. has been dx with hypothyroidism in 2010, now postoperative hypothyroidism; she is on brand-name Synthroid.  Pt is on Synthroid 175 mcg daily, taken: - in am - fasting - at least 30 min from b'fast - no Ca, Fe, MVI, + PPIs at night - not on Biotin  Reviewed her TFTs: Lab Results  Component Value Date   TSH 1.37 06/21/2018   TSH 4.45 01/11/2018   TSH 2.22 06/22/2017   TSH 2.99 12/22/2016   TSH 4.612 (H) 07/15/2016   FREET4 1.26 06/21/2018   FREET4 0.75 01/11/2018   FREET4 1.01 06/22/2017   FREET4 0.94 12/22/2016   FREET4 0.91 06/23/2016  03/29/2013: TSH 0.93, fT4 1.03 02/04/2013 (towards end of pregnancy): TSH 0.12, fT4 0.96 >> Synthroid reduced from 100 to 88 mcg 12/2012: TSH 0.07 >> Synthroid reduced from 112 to 100 mcg  Vitamin D insufficiency   She continues on 5000 units vitamin D daily.  Reviewed HbA1c levels:  Lab Results  Component Value Date   VD25OH 32.28 04/26/2019   VD25OH 32.43 01/11/2018   VD25OH 29.92 (L) 06/22/2017   VD25OH 34.67 12/22/2016   VD25OH 23.51 (L) 06/23/2016   VD25OH 9.6 (L) 11/05/2015    In the past, patient complained of palpitations >> started fall 2017 after we last increased her LT4, but exacerbated 06/2016.  Of note, she had full cardiac investigation including a Holter monitor >> PVC and PACs (217 events per 48h).  On metoprolol.  She also has PCOS.  Reviewed latest HbA1c level: Lab Results  Component Value Date   HGBA1C 5.9 04/26/2019   Her husband has type 1 diabetes as she is his caregiver.  He will start dialysis soon.  He is waiting for a pancreatic-kidney transplant.  ROS: Constitutional: no weight gain/no weight loss, + fatigue, no subjective hyperthermia, no subjective hypothermia Eyes: no blurry vision, no xerophthalmia ENT: no sore throat, + see HPI Cardiovascular: no CP/no SOB/no palpitations/no leg swelling Respiratory: no cough/no SOB/no wheezing Gastrointestinal: no N/no V/no D/+ C/no acid reflux Musculoskeletal: no muscle aches/no joint aches Skin: no rashes, no hair loss Neurological: no tremors/no numbness/no tingling/no dizziness  I reviewed pt's medications, allergies, PMH, social hx, family hx, and changes were documented in the history of present illness. Otherwise, unchanged from my initial visit note.  Past Medical History:  Diagnosis Date  . Allergy   . Anxiety   . Cancer [facility])    "thyroid cancer - thyroidectomy January 2016"  . Complication of anesthesia    "post operative myalgia"  . Depression   . Difficult intubation    "due to limited neck mobility"  . GERD (gastroesophageal reflux disease)    "not taking Prilosec any longer"  . Hashimoto's thyroiditis 2010  . History of bronchitis   . History of influenza pneumonia 2012  . History of kidney stones    lithotripsy  . Migraine    migraines- usually x1 weekly  . OSA on CPAP    cpap nightly-settings 16  . Papillary carcinoma, follicular variant ([place]) 05/31/2014  . PCOS (polycystic ovarian syndrome) 2003   takes Metformin for PCOS  . Stress incontinence   . Wears glasses     Past Surgical History:  Procedure Laterality Date  . ACHILLES TENDON SURGERY N/A 01/29/2016   Procedure: RIGHT ACHILLES  RECONSTRUCTION;  Surgeon: Wylene Simmer, MD;  Location: [facility];  Service: Orthopedics;  Laterality: N/A;  . EXTRACORPOREAL SHOCK WAVE LITHOTRIPSY Left   . GASTROCNEMIUS RECESSION N/A 01/29/2016   Procedure: GASTROC RECESSION POSSIBLE FLEXIBLE FLEXOR HALGUS LONGUS TRANSER;  Surgeon: Wylene Simmer, MD;  Location: [facility];  Service: Orthopedics;  Laterality: N/A;  . THYROID LOBECTOMY Right 05/16/2014   Procedure: RIGHT THYROID LOBECTOMY;  Surgeon: Armandina Gemma, MD;  Location: [facility];  Service: General;  Laterality: Right;  . THYROIDECTOMY N/A 06/01/2014   Procedure: COMPLETION THYROIDECTOMY;  Surgeon: Armandina Gemma, MD;  Location: [facility];  Service: General;  Laterality: N/A;  . TONSILECTOMY, ADENOIDECTOMY, BILATERAL MYRINGOTOMY AND TUBES     1984 and 1989   Social History   Socioeconomic History  . Marital status: Married    Spouse name: Not on file  . Number of children: Not on file  . Years of education: Not on file  . Highest education level: Not on file  Occupational History  . Not on file  Tobacco Use  . Smoking status: Former Smoker    Packs/day: 0.25    Years: 2.00    Pack years:  0.50    Types: Cigarettes    Quit date: 05/26/1998    Years since quitting: 21.5  . Smokeless tobacco: Never Used  . Tobacco comment: "quit smoking in 2000 - smoked for 2 years"   Vaping Use  . Vaping Use: Never used  Substance and Sexual Activity  . Alcohol use: No    Alcohol/week: 0.0 standard drinks  . Drug use: No  . Sexual activity: Yes  Other Topics Concern  . Not on file  Social History Narrative   Divorced    Lives with 2nd husband and 2 boys (2006, 2014)   Occ: Psychiatric nurse for [employer]   Edu: some college   Social Determinants of Radio broadcast assistant Strain:   . Difficulty of Paying Living Expenses:   Food Insecurity:   . Worried About Charity fundraiser in  the Last Year:   . Arboriculturist in the Last Year:   Transportation Needs:   . Film/video editor (Medical):   Marland Kitchen Lack of Transportation (Non-Medical):   Physical Activity:   . Days of Exercise per Week:   . Minutes of Exercise per Session:   Stress:   . Feeling of Stress :   Social Connections:   . Frequency of Communication with Friends and Family:   . Frequency of Social Gatherings with Friends and Family:   . Attends Religious Services:   . Active Member of Clubs or Organizations:   . Attends Archivist Meetings:   Marland Kitchen Marital Status:   Intimate Partner Violence:   . Fear of Current or Ex-Partner:   . Emotionally Abused:   Marland Kitchen Physically Abused:   . Sexually Abused:    Current Outpatient Medications on File Prior to Visit  Medication Sig Dispense Refill  . cetirizine (ZYRTEC) 10 MG tablet Take 10 mg by mouth daily.     . Cholecalciferol (VITAMIN D3) 5000 units TABS Take 5,000 Units by mouth daily.    . Cyanocobalamin (B-12) 1000 MCG SUBL Place 1 tablet under the tongue daily.    . fluticasone (FLONASE) 50 MCG/ACT nasal spray Place 2 sprays into both nostrils daily.    . metFORMIN (GLUCOPHAGE-XR) 750 MG 24 hr tablet Take 1 tablet (750 mg total) by mouth at bedtime. 90 tablet 3  . metoprolol succinate (TOPROL-XL) 100 MG 24 hr tablet Take 100 mg by mouth daily. Take with or immediately following a meal.    . montelukast (SINGULAIR) 10 MG tablet Take 1 tablet (10 mg total) by mouth daily. 90 tablet 3  . nystatin-triamcinolone ointment (MYCOLOG) Apply 1 application topically 2 (two) times daily. 30 g 0  . omeprazole (PRILOSEC) 40 MG capsule Take 1 capsule (40 mg total) by mouth daily. for 3 weeks then as needed 30 capsule 3  . sertraline (ZOLOFT) 100 MG tablet Take 1.5 tablets (150 mg total) by mouth daily. 135 tablet 3  . SYNTHROID 175 MCG tablet Take 1 tablet (175 mcg total) by mouth daily. FURTHER REFILLS REQUIRE APPOINTMENT 90 tablet 0   No current  facility-administered medications on file prior to visit.   Allergies  Allergen Reactions  . Tioconazole Itching, Swelling and Other (See Comments)    Burning (severe)  . Tape Hives and Swelling    Adhesive tapes  . Buspar [Buspirone]     Worsening mood   Family History  Problem Relation Age of Onset  . Thyroid disease Mother        thyroid goiter  .  Depression Mother   . Hyperlipidemia Mother   . Hypertension Mother   . CAD Mother 58       triple bypass  . Hyperlipidemia Father   . Hypertension Father   . Sleep apnea Father   . CAD Father 18       MI, stent  . Asthma Brother   . Depression Brother   . Heart disease Maternal Grandmother   . Hyperlipidemia Maternal Grandmother   . Heart disease Maternal Grandfather   . Hyperlipidemia Maternal Grandfather   . Heart disease Paternal Grandmother   . Hyperlipidemia Paternal Grandmother   . Heart disease Paternal Grandfather   . Hyperlipidemia Paternal Grandfather   . Diabetes Paternal Grandfather   . Sleep apnea Paternal Grandfather   . Cancer Neg Hx     PE: BP (!) 148/80 (BP Location: Right Wrist, Patient Position: Sitting, Cuff Size: Small)   Pulse 81   Ht 5' 2" (1.575 m)   Wt (!) 362 lb 6 oz (164.4 kg)   SpO2 98%   BMI 66.28 kg/m  Wt Readings from Last 3 Encounters:  12/02/19 (!) 362 lb 6 oz (164.4 kg)  08/08/19 (!) 359 lb 5 oz (163 kg)  04/26/19 (!) 363 lb 3 oz (164.7 kg)   Constitutional: overweight, in NAD Eyes: PERRLA, EOMI, no exophthalmos ENT: moist mucous membranes, no neck masses palpated, no cervical lymphadenopathy Cardiovascular: RRR, No MRG Respiratory: CTA B Gastrointestinal: abdomen soft, NT, ND, BS+ Musculoskeletal: no deformities, strength intact in all 4 Skin: moist, warm, no rashes Neurological: no tremor with outstretched hands, DTR normal in all 4  ASSESSMENT: 1. Follicular variant of PTC  2. Postsurgical hypothyroidism  3. Vitamin D deficiency  PLAN:  1. PTC - Patient with  follicular variant of papillary thyroid cancer, the least aggressive type of cancer.  This was encapsulated and it did not spread outside the thyroid.  She is stage I TNM -very good prognosis. -She denies neck compression symptoms -Review latest neck ultrasound report from 11/2015: No recurrences or metastasis -Reviewed her thyroglobulin and ATA from last visit: Thyroglobulin was lower than before, trending down, which is excellent -We will recheck her ATA and thyroglobulin level today -I would also like to repeat her neck ultrasound >> ordered today -I we will see her back in a year  2. Hypothyroidism - latest thyroid labs reviewed with pt >> normal: Lab Results  Component Value Date   TSH 1.37 06/21/2018   - she continues on Synthroid d.a.w. 175 mcg daily - pt feels good on this dose. - we discussed about taking the thyroid hormone every day, with water, >30 minutes before breakfast, separated by >4 hours from acid reflux medications, calcium, iron, multivitamins. Pt. is taking it correctly. - will check thyroid tests today: TSH and fT4 - If labs are abnormal, she will need to return for repeat TFTs in 1.5 months  3. Vitamin D deficiency -Reviewed latest vitamin D level.  This was normal in 04/2019 -She continues on 5000 units vitamin D daily -We will recheck her level today  Needs refills.   Component     Latest Ref Rng & Units 12/02/2019  Thyroglobulin     ng/mL 0.4 (L)  Comment        T4,Free(Direct)     0.60 - 1.60 ng/dL 1.02  TSH     0.35 - 4.50 uIU/mL 5.52 (H)  Vitamin D, 25-Hydroxy     30.0 - 100.0 ng/mL 31.0  Thyroglobulin Ab     <  or = 1 IU/mL <1   Tg slightly higher, but it is still very low.  Small fluctuations in the thyroglobulin are expected in the absence of RAI.  We will repeat this at next visit. TSH is slightly high.  I would like to repeat this in a month and a half before changing the dose. Vitamin D level was normal.  Neck U/S (12/14/2019): Isthmus:  Surgically absent. There is no residual nodular soft tissue within the isthmic resection bed.  Right lobe: Surgically absent. There is no residual nodular soft tissue within the right lobectomy resection bed.  Left lobe: Surgically absent. There is no residual nodular soft tissue within the left lobectomy resection bed.  _________________________________________________________  No regional cervical lymphadenopathy.  IMPRESSION: Post total thyroidectomy without evidence of residual or locally recurrent disease.  Philemon Kingdom, MD PhD [facility]

## 2019-12-02 NOTE — Patient Instructions (Signed)
Please stop at the lab.  Please continue Synthroid 175 mcg daily.  .Take the thyroid hormone every day, with water, at least 30 minutes before breakfast, separated by at least 4 hours from: - acid reflux medications - calcium - iron - multivitamins  Continue 5000 units vitamin D daily.  Please come back for a follow-up appointment in 1 year.

## 2019-12-03 LAB — VITAMIN D 25 HYDROXY (VIT D DEFICIENCY, FRACTURES): Vit D, 25-Hydroxy: 31 ng/mL (ref 30.0–100.0)

## 2019-12-05 ENCOUNTER — Other Ambulatory Visit: Payer: Self-pay | Admitting: Internal Medicine

## 2019-12-05 DIAGNOSIS — E890 Postprocedural hypothyroidism: Secondary | ICD-10-CM

## 2019-12-05 LAB — THYROGLOBULIN LEVEL: Thyroglobulin: 0.4 ng/mL — ABNORMAL LOW

## 2019-12-05 LAB — THYROGLOBULIN ANTIBODY: Thyroglobulin Ab: <1 IU/mL (ref <=1)

## 2019-12-05 MED ORDER — SYNTHROID 175 MCG PO TABS: 175.0000 ug | ORAL_TABLET | Freq: Every day | ORAL | 3 refills | Status: DC

## 2019-12-14 ENCOUNTER — Ambulatory Visit
Admission: RE | Admit: 2019-12-14 | Discharge: 2019-12-14 | Disposition: A | Discharge status: Home / Self Care | Payer: Managed Care, Other (non HMO) | Source: Ambulatory Visit | Attending: Internal Medicine | Admitting: Internal Medicine

## 2019-12-14 DIAGNOSIS — C73 Malignant neoplasm of thyroid gland: Secondary | ICD-10-CM

## 2019-12-16 ENCOUNTER — Other Ambulatory Visit: Payer: Self-pay | Admitting: Family Medicine

## 2019-12-27 ENCOUNTER — Ambulatory Visit: Payer: Managed Care, Other (non HMO) | Admitting: Obstetrics & Gynecology

## 2020-02-02 ENCOUNTER — Telehealth: Payer: Managed Care, Other (non HMO) | Admitting: Emergency Medicine

## 2020-02-02 DIAGNOSIS — J329 Chronic sinusitis, unspecified: Secondary | ICD-10-CM | POA: Diagnosis not present

## 2020-02-02 MED ORDER — AMOXICILLIN-POT CLAVULANATE 875-125 MG PO TABS: 1.0000 | ORAL_TABLET | Freq: Two times a day (BID) | ORAL | 0 refills | Status: DC

## 2020-02-02 MED ORDER — FLUCONAZOLE 150 MG PO TABS: ORAL_TABLET | ORAL | 0 refills | Status: DC

## 2020-02-02 NOTE — Progress Notes (Signed)
We are sorry that you are not feeling well.  Here is how we plan to help!  Based on what you have shared with me it looks like you have sinusitis.  Sinusitis is inflammation and infection in the sinus cavities of the head.  Based on your presentation I believe you most likely have Acute Bacterial Sinusitis.  This is an infection caused by bacteria and is treated with antibiotics. I have prescribed Augmentin 875mg /125mg  one tablet twice daily with food, for 7 days., along with Diflucan. You may use an oral decongestant such as Mucinex D or if you have glaucoma or high blood pressure use plain Mucinex. Saline nasal spray help and can safely be used as often as needed for congestion.  If you develop worsening sinus pain, fever or notice severe headache and vision changes, or if symptoms are not better after completion of antibiotic, please schedule an appointment with a health care provider.    Sinus infections are not as easily transmitted as other respiratory infection, however we still recommend that you avoid close contact with loved ones, especially the very young and elderly.  Remember to wash your hands thoroughly throughout the day as this is the number one way to prevent the spread of infection!  Home Care:  Only take medications as instructed by your medical team.  Complete the entire course of an antibiotic.  Do not take these medications with alcohol.  A steam or ultrasonic humidifier can help congestion.  You can place a towel over your head and breathe in the steam from hot water coming from a faucet.  Avoid close contacts especially the very young and the elderly.  Cover your mouth when you cough or sneeze.  Always remember to wash your hands.  Get Help Right Away If:  You develop worsening fever or sinus pain.  You develop a severe head ache or visual changes.  Your symptoms persist after you have completed your treatment plan.  Make sure you  Understand these  instructions.  Will watch your condition.  Will get help right away if you are not doing well or get worse.  Your e-visit answers were reviewed by a board certified advanced clinical practitioner to complete your personal care plan.  Depending on the condition, your plan could have included both over the counter or prescription medications.  If there is a problem please reply  once you have received a response from your provider.  Your safety is important to [place].  If you have drug allergies check your prescription carefully.    You can use MyChart to ask questions about today's visit, request a non-urgent call back, or ask for a work or school excuse for 24 hours related to this e-Visit. If it has been greater than 24 hours you will need to follow up with your provider, or enter a new e-Visit to address those concerns.  You will get an e-mail in the next two days asking about your experience.  I hope that your e-visit has been valuable and will speed your recovery. Thank you for using e-visits.   Approximately 5 minutes was used in reviewing the patient's chart, questionnaire, prescribing medications, and documentation.

## 2020-02-16 ENCOUNTER — Telehealth: Payer: Managed Care, Other (non HMO) | Admitting: Emergency Medicine

## 2020-02-16 DIAGNOSIS — M545 Low back pain, unspecified: Secondary | ICD-10-CM

## 2020-02-16 MED ORDER — CYCLOBENZAPRINE HCL 10 MG PO TABS: 10.0000 mg | ORAL_TABLET | Freq: Three times a day (TID) | ORAL | 0 refills | Status: DC | PRN

## 2020-02-16 MED ORDER — NAPROXEN 500 MG PO TABS: 500.0000 mg | ORAL_TABLET | Freq: Two times a day (BID) | ORAL | 0 refills | Status: DC

## 2020-02-16 NOTE — Progress Notes (Signed)

## 2020-04-18 ENCOUNTER — Other Ambulatory Visit: Payer: Self-pay | Admitting: Family Medicine

## 2020-04-18 NOTE — Telephone Encounter (Signed)
E-scribed refill.  Plz schedule cpe and lab visits.  

## 2020-04-23 ENCOUNTER — Encounter: Payer: Self-pay | Admitting: Family Medicine

## 2020-04-23 ENCOUNTER — Encounter: Payer: Self-pay | Admitting: Internal Medicine

## 2020-04-23 DIAGNOSIS — E890 Postprocedural hypothyroidism: Secondary | ICD-10-CM

## 2020-04-23 MED ORDER — SYNTHROID 175 MCG PO TABS: 175.0000 ug | ORAL_TABLET | Freq: Every day | ORAL | 3 refills | Status: DC

## 2020-04-23 MED ORDER — OMEPRAZOLE 40 MG PO CPDR: 40.0000 mg | DELAYED_RELEASE_CAPSULE | Freq: Every day | ORAL | 1 refills | Status: DC | PRN

## 2020-04-23 MED ORDER — METOPROLOL SUCCINATE ER 100 MG PO TB24: 100.0000 mg | ORAL_TABLET | Freq: Every day | ORAL | 1 refills | Status: DC

## 2020-04-23 MED ORDER — MONTELUKAST SODIUM 10 MG PO TABS: 10.0000 mg | ORAL_TABLET | Freq: Every day | ORAL | 1 refills | Status: DC

## 2020-04-23 MED ORDER — METFORMIN HCL ER 750 MG PO TB24: 750.0000 mg | ORAL_TABLET | Freq: Every day | ORAL | 1 refills | Status: DC

## 2020-04-23 MED ORDER — SERTRALINE HCL 100 MG PO TABS: 150.0000 mg | ORAL_TABLET | Freq: Every day | ORAL | 1 refills | Status: DC

## 2020-04-23 NOTE — Telephone Encounter (Signed)
Rx re-ordered to preferred pharmacy.

## 2020-04-23 NOTE — Telephone Encounter (Signed)
Nystatin-triamcinolone oint Last rx:  10/08/18, #30 g  Last OV:  08/08/19, abd pain Next OV:  07/30/20, CPE  Other meds already e-scribed.

## 2020-04-26 MED ORDER — NYSTATIN-TRIAMCINOLONE 100000-0.1 UNIT/GM-% EX OINT: 1.0000 "application " | TOPICAL_OINTMENT | Freq: Two times a day (BID) | CUTANEOUS | 0 refills | Status: DC

## 2020-04-26 NOTE — Telephone Encounter (Signed)
mycolog sent in.

## 2020-04-26 NOTE — Addendum Note (Signed)
Addended by: Ria Bush on: 04/26/2020 07:34 PM   Modules accepted: Orders

## 2020-07-23 ENCOUNTER — Other Ambulatory Visit: Payer: Self-pay | Admitting: Family Medicine

## 2020-07-23 DIAGNOSIS — E785 Hyperlipidemia, unspecified: Secondary | ICD-10-CM

## 2020-07-23 DIAGNOSIS — E559 Vitamin D deficiency, unspecified: Secondary | ICD-10-CM

## 2020-07-23 DIAGNOSIS — E890 Postprocedural hypothyroidism: Secondary | ICD-10-CM

## 2020-07-23 DIAGNOSIS — R7303 Prediabetes: Secondary | ICD-10-CM

## 2020-07-24 ENCOUNTER — Other Ambulatory Visit: Payer: Self-pay

## 2020-07-25 ENCOUNTER — Other Ambulatory Visit: Payer: Self-pay | Admitting: Family Medicine

## 2020-07-25 ENCOUNTER — Other Ambulatory Visit (INDEPENDENT_AMBULATORY_CARE_PROVIDER_SITE_OTHER)

## 2020-07-25 ENCOUNTER — Other Ambulatory Visit: Payer: Self-pay

## 2020-07-25 DIAGNOSIS — R7303 Prediabetes: Secondary | ICD-10-CM

## 2020-07-25 DIAGNOSIS — E559 Vitamin D deficiency, unspecified: Secondary | ICD-10-CM

## 2020-07-25 DIAGNOSIS — E785 Hyperlipidemia, unspecified: Secondary | ICD-10-CM

## 2020-07-25 DIAGNOSIS — E890 Postprocedural hypothyroidism: Secondary | ICD-10-CM

## 2020-07-25 LAB — LIPID PANEL
Cholesterol: 275 mg/dL — ABNORMAL HIGH (ref 0–200)
HDL: 50.3 mg/dL (ref >39.00)
LDL Cholesterol: 191 mg/dL — ABNORMAL HIGH (ref 0–99)
NonHDL: 224.4
Total CHOL/HDL Ratio: 5
Triglycerides: 168 mg/dL — ABNORMAL HIGH (ref 0.0–149.0)
VLDL: 33.6 mg/dL (ref 0.0–40.0)

## 2020-07-25 LAB — COMPREHENSIVE METABOLIC PANEL
ALT: 20 U/L (ref 0–35)
AST: 21 U/L (ref 0–37)
Albumin: 4.1 g/dL (ref 3.5–5.2)
Alkaline Phosphatase: 86 U/L (ref 39–117)
BUN: 11 mg/dL (ref 6–23)
CO2: 30 mEq/L (ref 19–32)
Calcium: 8.6 mg/dL (ref 8.4–10.5)
Chloride: 101 mEq/L (ref 96–112)
Creatinine, Ser: 0.65 mg/dL (ref 0.40–1.20)
GFR: 109.17 mL/min (ref >60.00)
Glucose, Bld: 96 mg/dL (ref 70–99)
Potassium: 4.4 mEq/L (ref 3.5–5.1)
Sodium: 136 mEq/L (ref 135–145)
Total Bilirubin: 0.4 mg/dL (ref 0.2–1.2)
Total Protein: 7.3 g/dL (ref 6.0–8.3)

## 2020-07-25 LAB — HEMOGLOBIN A1C: Hgb A1c MFr Bld: 5.6 % (ref 4.6–6.5)

## 2020-07-26 LAB — T4, FREE: Free T4: 1.02 ng/dL (ref 0.60–1.60)

## 2020-07-26 LAB — TSH: TSH: 6.06 u[IU]/mL — ABNORMAL HIGH (ref 0.35–4.50)

## 2020-07-26 LAB — VITAMIN D 25 HYDROXY (VIT D DEFICIENCY, FRACTURES): VITD: 31.21 ng/mL (ref 30.00–100.00)

## 2020-07-30 ENCOUNTER — Other Ambulatory Visit: Payer: Self-pay

## 2020-07-30 ENCOUNTER — Encounter: Payer: Self-pay | Admitting: Family Medicine

## 2020-07-30 ENCOUNTER — Ambulatory Visit (INDEPENDENT_AMBULATORY_CARE_PROVIDER_SITE_OTHER): Admitting: Family Medicine

## 2020-07-30 VITALS — BP 124/84 | HR 83 | Temp 97.9°F | Ht 62.0 in | Wt 361.3 lb

## 2020-07-30 DIAGNOSIS — E282 Polycystic ovarian syndrome: Secondary | ICD-10-CM | POA: Diagnosis not present

## 2020-07-30 DIAGNOSIS — F331 Major depressive disorder, recurrent, moderate: Secondary | ICD-10-CM

## 2020-07-30 DIAGNOSIS — E559 Vitamin D deficiency, unspecified: Secondary | ICD-10-CM

## 2020-07-30 DIAGNOSIS — E890 Postprocedural hypothyroidism: Secondary | ICD-10-CM

## 2020-07-30 DIAGNOSIS — G44219 Episodic tension-type headache, not intractable: Secondary | ICD-10-CM

## 2020-07-30 DIAGNOSIS — Z1231 Encounter for screening mammogram for malignant neoplasm of breast: Secondary | ICD-10-CM | POA: Diagnosis not present

## 2020-07-30 DIAGNOSIS — R7303 Prediabetes: Secondary | ICD-10-CM

## 2020-07-30 DIAGNOSIS — Z6841 Body Mass Index (BMI) 40.0 and over, adult: Secondary | ICD-10-CM

## 2020-07-30 DIAGNOSIS — Z8249 Family history of ischemic heart disease and other diseases of the circulatory system: Secondary | ICD-10-CM

## 2020-07-30 DIAGNOSIS — K760 Fatty (change of) liver, not elsewhere classified: Secondary | ICD-10-CM

## 2020-07-30 DIAGNOSIS — G4733 Obstructive sleep apnea (adult) (pediatric): Secondary | ICD-10-CM

## 2020-07-30 DIAGNOSIS — Z0000 Encounter for general adult medical examination without abnormal findings: Secondary | ICD-10-CM | POA: Diagnosis not present

## 2020-07-30 DIAGNOSIS — Z9989 Dependence on other enabling machines and devices: Secondary | ICD-10-CM

## 2020-07-30 DIAGNOSIS — E785 Hyperlipidemia, unspecified: Secondary | ICD-10-CM

## 2020-07-30 MED ORDER — CO-ENZYME Q-10 50 MG PO CAPS: 50.0000 mg | ORAL_CAPSULE | Freq: Every day | ORAL | Status: DC

## 2020-07-30 MED ORDER — CYCLOBENZAPRINE HCL 10 MG PO TABS: 5.0000 mg | ORAL_TABLET | Freq: Two times a day (BID) | ORAL | 0 refills | Status: DC | PRN

## 2020-07-30 MED ORDER — ATORVASTATIN CALCIUM 40 MG PO TABS: 40.0000 mg | ORAL_TABLET | Freq: Every day | ORAL | 3 refills | Status: DC

## 2020-07-30 NOTE — Progress Notes (Unsigned)
Patient ID: Linda Welch, female    DOB: 07-13-1980, 40 y.o.   MRN: [id number]  This visit was conducted in person.  BP 124/84   Pulse 83   Temp 97.9 F (36.6 C) (Temporal)   Ht 5\' 2"  (1.575 m)   Wt (!) 361 lb 5 oz (163.9 kg)   LMP 07/17/2020   SpO2 97%   BMI 66.08 kg/m    CC: CPE Subjective:   HPI: Linda Welch is a 40 y.o. female presenting on 07/30/2020 for Annual Exam and Headache (C/o HA for last 2 mos. )   Family stressors - husband undergoing dialysis for T1DM.   PCOS on metformin   HA present for last 2 months - describes at base of neck as well as constant L ear pain. Wonders if TMJ related. Treating with advil 600mg  once daily - about 4x/wk. HA comes on later in the day. Doesn't wake up with headache - this doesn't feel like her OSA headache - and she regularly uses CPAP for the last 14 yrs. Different from her typical migraine symptoms.   OSA on CPAP diagnosed ~14 yrs ago. Has not seen sleep doctor. Pressure setting of 16. May need to re establish with pulm.   HLD - chol levels markedly elevated. Strong fmhx HLD and premature CAD. Has previously seen Dr Ubaldo Glassing.   Preventative: GYN - Dr Kenton Kingfisher at [facility].All normal pap smearsin the past. Overdue for f/u - last pap 2017. Will call for appt. LMP end of 06/2020 - regular recently  Breast cancer screening - no fmhx breast cancer. Would like referral for baseline mammogram.  Flu shot yearly  [place] 07/2019, 08/2019, booster 04/2020  Tdap 2013, 2014  Prevnar 2015 Pneumovax 12/2018  Seat belt use discussed. Sunscreen use discussed. Wants spot on back checked. Fmhx skin cancer. 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 Interim medical history since our last visit reviewed. Allergies and medications reviewed and updated. Outpatient Medications Prior to Visit  Medication Sig Dispense Refill  . cetirizine (ZYRTEC) 10 MG tablet Take 10 mg by mouth daily.     . Cholecalciferol (VITAMIN D3) 5000 units TABS Take 5,000 Units by mouth daily.    . fluticasone (FLONASE) 50 MCG/ACT nasal spray Place 2 sprays into both nostrils daily.    . metFORMIN (GLUCOPHAGE-XR) 750 MG 24 hr tablet Take 1 tablet (750 mg total) by mouth at bedtime. 90 tablet 1  . metoprolol succinate (TOPROL-XL) 100 MG 24 hr tablet Take 1 tablet (100 mg total) by mouth daily. Take with or immediately following a meal. 90 tablet 1  . montelukast (SINGULAIR) 10 MG tablet TAKE ONE TABLET BY MOUTH DAILY 90 tablet 0  . nystatin-triamcinolone ointment (MYCOLOG) Apply 1 application topically 2 (two) times daily. As needed, for max 10-14 days at a time 30 g 0  . omeprazole (PRILOSEC) 40 MG capsule Take 1 capsule (40 mg total) by mouth daily as needed. 90 capsule 1  . sertraline (ZOLOFT) 100 MG tablet Take 1.5 tablets (150 mg total) by mouth daily. 135 tablet 1  . SYNTHROID 175  MCG tablet Take 1 tablet (175 mcg total) by mouth daily. 45 tablet 3  . vitamin B-12 (CYANOCOBALAMIN) 100 MCG tablet Take 100 mcg by mouth daily.    Marland Kitchen amoxicillin-clavulanate (AUGMENTIN) 875-125 MG tablet Take 1 tablet by mouth every 12 (twelve) hours. 14 tablet 0  . cyclobenzaprine (FLEXERIL) 10 MG tablet Take 1 tablet (10 mg total) by mouth 3 (three) times daily as needed for muscle spasms. 10 tablet 0  . fluconazole (DIFLUCAN) 150 MG tablet Take 1 tablet after finishing your antibiotics. 1 tablet 0  . naproxen (NAPROSYN) 500 MG tablet Take 1 tablet (500 mg total) by mouth 2 (two) times daily with a meal. 30 tablet 0   No facility-administered medications prior to visit.      Per HPI unless specifically indicated in ROS section below Review of Systems  Constitutional: Negative for activity change, appetite change, chills, fatigue, fever and unexpected weight change.  HENT: Negative for hearing loss.   Eyes: Negative for visual disturbance.  Respiratory: Negative for cough, chest tightness, shortness of breath and wheezing.   Cardiovascular: Negative for chest pain, palpitations and leg swelling.  Gastrointestinal: Positive for diarrhea. Negative for abdominal distention, abdominal pain, blood in stool, constipation, nausea and vomiting.  Genitourinary: Negative for difficulty urinating and hematuria.  Musculoskeletal: Negative for arthralgias, myalgias and neck pain.  Skin: Negative for rash.  Neurological: Positive for light-headedness and headaches. Negative for dizziness, seizures and syncope.  Hematological: Negative for adenopathy. Does not bruise/bleed easily.  Psychiatric/Behavioral: Positive for dysphoric mood. The patient is nervous/anxious.    Objective:  BP 124/84   Pulse 83   Temp 97.9 F (36.6 C) (Temporal)   Ht 5\' 2"  (1.575 m)   Wt (!) 361 lb 5 oz (163.9 kg)   LMP 07/17/2020   SpO2 97%   BMI 66.08 kg/m   Wt Readings from Last 3 Encounters:  07/30/20 (!) 361 lb 5 oz (163.9 kg)  12/02/19 (!) 362 lb 6 oz (164.4 kg)  08/08/19 (!) 359 lb 5 oz (163 kg)      Physical Exam Vitals and nursing note reviewed.  Constitutional:      General: She is not in acute distress.    Appearance: She is well-developed and well-nourished. She is obese. She is not ill-appearing.  HENT:     Head: Normocephalic and atraumatic.     Right Ear: Hearing, tympanic membrane, ear canal and external ear normal.     Left Ear: Hearing, tympanic membrane, ear canal and external ear normal.     Mouth/Throat:     Mouth: Oropharynx is clear and moist and mucous membranes are normal.     Pharynx: No posterior oropharyngeal edema.  Eyes:     Extraocular Movements:  Extraocular movements intact and EOM normal.     Conjunctiva/sclera: Conjunctivae normal.     Pupils: Pupils are equal, round, and reactive to light.  Neck:     Thyroid: No thyroid mass or thyromegaly.     Comments: Discomfort to palpation of cervical neck with preserved ROM Cardiovascular:     Rate and Rhythm: Normal rate and regular rhythm.     Pulses: Normal pulses and intact distal pulses.          Radial pulses are 2+ on the right side and 2+ on the left side.     Heart sounds: Normal heart sounds. No murmur heard.   Pulmonary:     Effort: Pulmonary effort is normal. No respiratory distress.  Breath sounds: Normal breath sounds. No wheezing, rhonchi or rales.  Abdominal:     General: Bowel sounds are normal. There is no distension.     Palpations: Abdomen is soft. There is no mass.     Tenderness: There is no abdominal tenderness. There is no guarding or rebound.     Hernia: No hernia is present.  Musculoskeletal:        General: No edema. Normal range of motion.     Cervical back: Normal range of motion and neck supple.     Right lower leg: No edema.     Left lower leg: No edema.  Lymphadenopathy:     Cervical: No cervical adenopathy.  Skin:    General: Skin is warm and dry.     Findings: No rash.  Neurological:     General: No focal deficit present.     Mental Status: She is alert and oriented to person, place, and time.     Comments: CN grossly intact, station and gait intact  Psychiatric:        Mood and Affect: Mood and affect and mood normal.        Behavior: Behavior normal.        Thought Content: Thought content normal.        Judgment: Judgment normal.       Results for orders placed or performed in visit on 07/25/20  T4, free  Result Value Ref Range   Free T4 1.02 0.60 - 1.60 ng/dL  TSH  Result Value Ref Range   TSH 6.06 (H) 0.35 - 4.50 uIU/mL  VITAMIN D 25 Hydroxy (Vit-D Deficiency, Fractures)  Result Value Ref Range   VITD 31.21 30.00 - 100.00  ng/mL  Hemoglobin A1c  Result Value Ref Range   Hgb A1c MFr Bld 5.6 4.6 - 6.5 %  Lipid panel  Result Value Ref Range   Cholesterol 275 (H) 0 - 200 mg/dL   Triglycerides 168.0 (H) 0.0 - 149.0 mg/dL   HDL 50.30 >39.00 mg/dL   VLDL 33.6 0.0 - 40.0 mg/dL   LDL Cholesterol 191 (H) 0 - 99 mg/dL   Total CHOL/HDL Ratio 5    NonHDL 224.40   Comprehensive metabolic panel  Result Value Ref Range   Sodium 136 135 - 145 mEq/L   Potassium 4.4 3.5 - 5.1 mEq/L   Chloride 101 96 - 112 mEq/L   CO2 30 19 - 32 mEq/L   Glucose, Bld 96 70 - 99 mg/dL   BUN 11 6 - 23 mg/dL   Creatinine, Ser 0.65 0.40 - 1.20 mg/dL   Total Bilirubin 0.4 0.2 - 1.2 mg/dL   Alkaline Phosphatase 86 39 - 117 U/L   AST 21 0 - 37 U/L   ALT 20 0 - 35 U/L   Total Protein 7.3 6.0 - 8.3 g/dL   Albumin 4.1 3.5 - 5.2 g/dL   GFR 109.17 >60.00 mL/min   Calcium 8.6 8.4 - 10.5 mg/dL   EKG - NSR rate 75, normal axis, intervals, no hypertrophy or acute ST/T changes Assessment & Plan:  This visit occurred during the SARS-CoV-2 public health emergency.  Safety protocols were in place, including screening questions prior to the visit, additional usage of staff PPE, and extensive cleaning of exam room while observing appropriate contact time as indicated for disinfecting solutions.   Problem List Items Addressed This Visit    PCOS (polycystic ovarian syndrome)    On metformin. May want to touch base with endo  about this.       H/O thyroidectomy    H/o papillary thyroid cancer, sees endo       OSA on CPAP    Regularly uses CPAP at pressure setting of 16      MDD (major depressive disorder), recurrent episode, moderate (HCC)    Stable period on sertraline 150mg  daily despite high stressors due to family illness.      Postsurgical hypothyroidism    Continues brand synthroid replacement through endo Cruzita Lederer) She will touch base with endo about elevated TSH.       BMI 60.0-69.9, adult (HCC)    Encouraged healthy diet and  lifestyle choices to affect sustainable weight loss.       Vitamin D deficiency    Continue 5000 units daily.  Levels low normal despite this.       Encounter for health maintenance examination in adult - Primary    Preventative protocols reviewed and updated unless pt declined. Discussed healthy diet and lifestyle.  Will order updated mammo per pt request - [facility] at her convenience.       Relevant Orders   EKG 12-Lead (Completed)   Hyperlipidemia    Chronic, LDL >190 this year. rec start statin - she will try atorvastatin 40mg  and see if tolerated. Strong fmhx premature CAD. Check baseline EKG - normal. Discussed coQ10 preventatively. The 10-year ASCVD risk score Mikey Bussing DC Brooke Bonito., et al., 2013) is: 2.5%   Values used to calculate the score:     Age: 32 years     Sex: Female     Is Non-Hispanic African American: No     Diabetic: Yes     Tobacco smoker: No     Systolic Blood Pressure: 299 mmHg     Is BP treated: No     HDL Cholesterol: 50.3 mg/dL     Total Cholesterol: 275 mg/dL       Relevant Medications   atorvastatin (LIPITOR) 40 MG tablet   TTH (tension-type headache)    Anticipate recurrent TTH - start flexeril PRN. Update if not improving with this.       Relevant Medications   cyclobenzaprine (FLEXERIL) 10 MG tablet   Prediabetes    A1c normal range this year.       Fatty liver disease, nonalcoholic   Family history of premature CAD    Baseline EKG today        Other Visit Diagnoses    Encounter for screening mammogram for malignant neoplasm of breast       Relevant Orders   MM Digital Screening       Meds ordered this encounter  Medications  . cyclobenzaprine (FLEXERIL) 10 MG tablet    Sig: Take 0.5-1 tablets (5-10 mg total) by mouth 2 (two) times daily as needed (tension headache - with sedation precautions).    Dispense:  30 tablet    Refill:  0  . atorvastatin (LIPITOR) 40 MG tablet    Sig: Take 1 tablet (40 mg  total) by mouth daily.    Dispense:  90 tablet    Refill:  3  . co-enzyme Q-10 50 MG capsule    Sig: Take 1 capsule (50 mg total) by mouth daily.   Orders Placed This Encounter  Procedures  . MM Digital Screening    Standing Status:   Future    Standing Expiration Date:   07/30/2021    Order Specific Question:   Reason for  Exam (SYMPTOM  OR DIAGNOSIS REQUIRED)    Answer:   breast cancer screening    Order Specific Question:   Is the patient pregnant?    Answer:   No    Order Specific Question:   Preferred imaging location?    Answer:   [facility]  . EKG 12-Lead    Patient instructions: Call for GYN appointment. Call for dental appointment  Touch base with Dr Cruzita Lederer about thyroid levels Baseline EKG today  Cholesterol too high - start atorvastatin (Lipitor) 40mg  daily. May also start co enzyme Q10.  Start flexeril 5-10mg  as needed for headaches Good to see you today. Return as needed or in 6 months for follow up visit.   Follow up plan: Return in about 6 months (around 01/30/2021) for follow up visit.  Ria Bush, MD

## 2020-07-30 NOTE — Assessment & Plan Note (Addendum)
Preventative protocols reviewed and updated unless pt declined. Discussed healthy diet and lifestyle.  Will order updated mammo per pt request - [facility] at her convenience.

## 2020-07-30 NOTE — Patient Instructions (Addendum)
Call for GYN appointment. Call for dental appointment  Touch base with Dr Cruzita Lederer about thyroid levels Baseline EKG today  Cholesterol too high - start atorvastatin (Lipitor) 40mg  daily. May also start co enzyme Q10.  Start flexeril 5-10mg  as needed for headaches Good to see you today. Return as needed or in 6 months for follow up visit.   Health Maintenance, Female Adopting a healthy lifestyle and getting preventive care are important in promoting health and wellness. Ask your health care provider about:  The right schedule for you to have regular tests and exams.  Things you can do on your own to prevent diseases and keep yourself healthy. What should I know about diet, weight, and exercise? Eat a healthy diet  Eat a diet that includes plenty of vegetables, fruits, low-fat dairy products, and lean protein.  Do not eat a lot of foods that are high in solid fats, added sugars, or sodium.   Maintain a healthy weight Body mass index (BMI) is used to identify weight problems. It estimates body fat based on height and weight. Your health care provider can help determine your BMI and help you achieve or maintain a healthy weight. Get regular exercise Get regular exercise. This is one of the most important things you can do for your health. Most adults should:  Exercise for at least 150 minutes each week. The exercise should increase your heart rate and make you sweat (moderate-intensity exercise).  Do strengthening exercises at least twice a week. This is in addition to the moderate-intensity exercise.  Spend less time sitting. Even light physical activity can be beneficial. Watch cholesterol and blood lipids Have your blood tested for lipids and cholesterol at 42 years of age, then have this test every 5 years. Have your cholesterol levels checked more often if:  Your lipid or cholesterol levels are high.  You are older than 42 years of age.  You are at high risk for heart  disease. What should I know about cancer screening? Depending on your health history and family history, you may need to have cancer screening at various ages. This may include screening for:  Breast cancer.  Cervical cancer.  Colorectal cancer.  Skin cancer.  Lung cancer. What should I know about heart disease, diabetes, and high blood pressure? Blood pressure and heart disease  High blood pressure causes heart disease and increases the risk of stroke. This is more likely to develop in people who have high blood pressure readings, are of African descent, or are overweight.  Have your blood pressure checked: ? Every 3-5 years if you are 54-65 years of age. ? Every year if you are 34 years old or older. Diabetes Have regular diabetes screenings. This checks your fasting blood sugar level. Have the screening done:  Once every three years after age 72 if you are at a normal weight and have a low risk for diabetes.  More often and at a younger age if you are overweight or have a high risk for diabetes. What should I know about preventing infection? Hepatitis B If you have a higher risk for hepatitis B, you should be screened for this virus. Talk with your health care provider to find out if you are at risk for hepatitis B infection. Hepatitis C Testing is recommended for:  Everyone born from [birth year] through 1967.  Anyone with known risk factors for hepatitis C. Sexually transmitted infections (STIs)  Get screened for STIs, including gonorrhea and chlamydia, if: ? You are  sexually active and are younger than 42 years of age. ? You are older than 42 years of age and your health care provider tells you that you are at risk for this type of infection. ? Your sexual activity has changed since you were last screened, and you are at increased risk for chlamydia or gonorrhea. Ask your health care provider if you are at risk.  Ask your health care provider about whether you are at high  risk for HIV. Your health care provider may recommend a prescription medicine to help prevent HIV infection. If you choose to take medicine to prevent HIV, you should first get tested for HIV. You should then be tested every 3 months for as long as you are taking the medicine. Pregnancy  If you are about to stop having your period (premenopausal) and you may become pregnant, seek counseling before you get pregnant.  Take 400 to 800 micrograms (mcg) of folic acid every day if you become pregnant.  Ask for birth control (contraception) if you want to prevent pregnancy. Osteoporosis and menopause Osteoporosis is a disease in which the bones lose minerals and strength with aging. This can result in bone fractures. If you are 62 years old or older, or if you are at risk for osteoporosis and fractures, ask your health care provider if you should:  Be screened for bone loss.  Take a calcium or vitamin D supplement to lower your risk of fractures.  Be given hormone replacement therapy (HRT) to treat symptoms of menopause. Follow these instructions at home: Lifestyle  Do not use any products that contain nicotine or tobacco, such as cigarettes, e-cigarettes, and chewing tobacco. If you need help quitting, ask your health care provider.  Do not use street drugs.  Do not share needles.  Ask your health care provider for help if you need support or information about quitting drugs. Alcohol use  Do not drink alcohol if: ? Your health care provider tells you not to drink. ? You are pregnant, may be pregnant, or are planning to become pregnant.  If you drink alcohol: ? Limit how much you use to 0-1 drink a day. ? Limit intake if you are breastfeeding.  Be aware of how much alcohol is in your drink. In the [place] one drink equals one 12 oz bottle of beer (355 mL), one 5 oz glass of wine (148 mL), or one 1 oz glass of hard liquor (44 mL). General instructions  Schedule regular health, dental,  and eye exams.  Stay current with your vaccines.  Tell your health care provider if: ? You often feel depressed. ? You have ever been abused or do not feel safe at home. Summary  Adopting a healthy lifestyle and getting preventive care are important in promoting health and wellness.  Follow your health care provider's instructions about healthy diet, exercising, and getting tested or screened for diseases.  Follow your health care provider's instructions on monitoring your cholesterol and blood pressure. This information is not intended to replace advice given to you by your health care provider. Make sure you discuss any questions you have with your health care provider. Document Revised: 05/05/2018 Document Reviewed: 05/05/2018 Elsevier Patient Education  2021 [employer].

## 2020-08-01 DIAGNOSIS — Z8249 Family history of ischemic heart disease and other diseases of the circulatory system: Secondary | ICD-10-CM | POA: Insufficient documentation

## 2020-08-01 NOTE — Assessment & Plan Note (Signed)
Stable period on sertraline 150mg  daily despite high stressors due to family illness.

## 2020-08-01 NOTE — Assessment & Plan Note (Addendum)
H/o papillary thyroid cancer, sees endo

## 2020-08-01 NOTE — Assessment & Plan Note (Addendum)
Continues brand synthroid replacement through endo Cruzita Lederer) She will touch base with endo about elevated TSH.

## 2020-08-01 NOTE — Assessment & Plan Note (Signed)
A1c normal range this year.

## 2020-08-01 NOTE — Assessment & Plan Note (Addendum)
On metformin. May want to touch base with endo about this.

## 2020-08-01 NOTE — Assessment & Plan Note (Signed)
Baseline EKG today

## 2020-08-01 NOTE — Assessment & Plan Note (Signed)
Regularly uses CPAP at pressure setting of 16

## 2020-08-01 NOTE — Assessment & Plan Note (Addendum)
Chronic, LDL >190 this year. rec start statin - she will try atorvastatin 40mg  and see if tolerated. Strong fmhx premature CAD. Check baseline EKG - normal. Discussed coQ10 preventatively. The 10-year ASCVD risk score Mikey Bussing DC Brooke Bonito., et al., 2013) is: 2.5%   Values used to calculate the score:     Age: 40 years     Sex: Female     Is Non-Hispanic African American: No     Diabetic: Yes     Tobacco smoker: No     Systolic Blood Pressure: 329 mmHg     Is BP treated: No     HDL Cholesterol: 50.3 mg/dL     Total Cholesterol: 275 mg/dL

## 2020-08-01 NOTE — Assessment & Plan Note (Addendum)
Continue 5000 units daily.  Levels low normal despite this.

## 2020-08-01 NOTE — Assessment & Plan Note (Signed)
Encouraged healthy diet and lifestyle choices to affect sustainable weight loss.  ?

## 2020-08-01 NOTE — Assessment & Plan Note (Signed)
Anticipate recurrent TTH - start flexeril PRN. Update if not improving with this.

## 2020-08-09 ENCOUNTER — Encounter: Payer: Self-pay | Admitting: Family Medicine

## 2020-08-10 ENCOUNTER — Other Ambulatory Visit: Payer: Self-pay | Admitting: Family Medicine

## 2020-08-10 NOTE — Telephone Encounter (Signed)
Spoke with Aniceto Boss at [facility] asking if rxs were received.  Confirms they were and were shipped and pt is scheduled to received shipment tomorrow.   Fwd to Dr. Darnell Level to address 2nd part of message.

## 2020-08-10 NOTE — Telephone Encounter (Signed)
Pharmacy requests refill on: Fluticasone 50 mcg/act nasal spray  LAST REFILL: 04/26/2019 LAST OV: 07/30/2020 NEXT OV: 02/05/2021 PHARMACY: [facility]

## 2020-08-13 MED ORDER — FLUTICASONE PROPIONATE 50 MCG/ACT NA SUSP: 2.0000 | Freq: Every day | NASAL | 3 refills | Status: DC

## 2020-08-13 NOTE — Telephone Encounter (Signed)
ERx 

## 2020-08-21 ENCOUNTER — Telehealth: Payer: Self-pay

## 2020-08-21 NOTE — Telephone Encounter (Signed)
Received faxed Prescription Letter of Medical Necessity from [facility] for CPAP supplies.   Placed form in Dr. Synthia Innocent box.

## 2020-08-22 NOTE — Telephone Encounter (Signed)
Signed and in LIsa's box.

## 2020-08-23 NOTE — Telephone Encounter (Signed)
Faxed form to attn:  Rosann Auerbach at [phone].

## 2020-09-23 ENCOUNTER — Other Ambulatory Visit: Payer: Self-pay | Admitting: Family Medicine

## 2020-09-23 ENCOUNTER — Encounter: Payer: Self-pay | Admitting: Family Medicine

## 2020-09-23 ENCOUNTER — Encounter: Payer: Self-pay | Admitting: Internal Medicine

## 2020-09-24 MED ORDER — SERTRALINE HCL 100 MG PO TABS: 150.0000 mg | ORAL_TABLET | Freq: Every day | ORAL | 3 refills | Status: DC

## 2020-09-24 MED ORDER — METFORMIN HCL ER 750 MG PO TB24: 750.0000 mg | ORAL_TABLET | Freq: Every day | ORAL | 3 refills | Status: DC

## 2020-09-24 MED ORDER — METOPROLOL SUCCINATE ER 100 MG PO TB24: 100.0000 mg | ORAL_TABLET | Freq: Every day | ORAL | 3 refills | Status: DC

## 2020-09-24 NOTE — Telephone Encounter (Signed)
E-scribed refills.  

## 2020-09-25 MED ORDER — MONTELUKAST SODIUM 10 MG PO TABS: 1.0000 | ORAL_TABLET | Freq: Every day | ORAL | 3 refills | Status: DC

## 2020-09-25 NOTE — Telephone Encounter (Signed)
Nystatin-triamcinolone oint last rx:  04/26/20, #30 Flexeril last rx:  07/30/20, #30 Last OV:  07/30/20, CPE Next OV:  02/05/21, 6 mo f/u  CoQ10

## 2020-09-26 ENCOUNTER — Other Ambulatory Visit: Payer: Self-pay

## 2020-09-26 DIAGNOSIS — E890 Postprocedural hypothyroidism: Secondary | ICD-10-CM

## 2020-09-26 MED ORDER — SYNTHROID 175 MCG PO TABS: 175.0000 ug | ORAL_TABLET | Freq: Every day | ORAL | 0 refills | Status: DC

## 2020-09-26 MED ORDER — CO-ENZYME Q-10 50 MG PO CAPS: 50.0000 mg | ORAL_CAPSULE | Freq: Every day | ORAL | 1 refills | Status: DC

## 2020-09-26 MED ORDER — NYSTATIN-TRIAMCINOLONE 100000-0.1 UNIT/GM-% EX OINT: 1.0000 "application " | TOPICAL_OINTMENT | Freq: Two times a day (BID) | CUTANEOUS | 0 refills | Status: DC

## 2020-09-26 MED ORDER — CYCLOBENZAPRINE HCL 10 MG PO TABS: 5.0000 mg | ORAL_TABLET | Freq: Two times a day (BID) | ORAL | 0 refills | Status: DC | PRN

## 2020-12-06 ENCOUNTER — Encounter: Payer: Self-pay | Admitting: Internal Medicine

## 2020-12-06 ENCOUNTER — Ambulatory Visit (INDEPENDENT_AMBULATORY_CARE_PROVIDER_SITE_OTHER): Admitting: Internal Medicine

## 2020-12-06 ENCOUNTER — Other Ambulatory Visit: Payer: Self-pay

## 2020-12-06 VITALS — BP 128/88 | HR 86 | Ht 62.0 in | Wt 367.4 lb

## 2020-12-06 DIAGNOSIS — E890 Postprocedural hypothyroidism: Secondary | ICD-10-CM

## 2020-12-06 DIAGNOSIS — C73 Malignant neoplasm of thyroid gland: Secondary | ICD-10-CM | POA: Diagnosis not present

## 2020-12-06 DIAGNOSIS — E559 Vitamin D deficiency, unspecified: Secondary | ICD-10-CM | POA: Diagnosis not present

## 2020-12-06 LAB — TSH: TSH: 3.11 u[IU]/mL (ref 0.35–5.50)

## 2020-12-06 LAB — T4, FREE: Free T4: 0.92 ng/dL (ref 0.60–1.60)

## 2020-12-06 NOTE — Patient Instructions (Signed)
Please stop at the lab.  Please continue Synthroid 175 mcg daily.  .Take the thyroid hormone every day, with water, at least 30 minutes before breakfast, separated by at least 4 hours from: - acid reflux medications - calcium - iron - multivitamins  Continue 5000 units vitamin D daily.  Please come back for a follow-up appointment in 1 year.

## 2020-12-06 NOTE — Progress Notes (Signed)
Patient ID: Linda Welch, female   DOB: [birth year], 40 y.o.   MRN: [id number]  This visit occurred during the SARS-CoV-2 public health emergency.  Safety protocols were in place, including screening questions prior to the visit, additional usage of staff PPE, and extensive cleaning of exam room while observing appropriate contact time as indicated for disinfecting solutions.   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  Last visit 1 year ago.  Interim hx: Her menses have become regular in the last year. He has a lot of stress with her husband being sick.  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  He is doing hemodialysis She is very tired, jittery, has diarrhea - since the 20s. Otherwise, she denies palpitations, tremors, weight gain or loss.  Reviewed and addended her thyroid cancer history: - dx in in 02/2009 >> 3 cm nodule felt by pt >> seen by PCP >> referred to endo >> uptake and scan >> cold nodule >> FNA by Dr Francoise Schaumann: benign (bad experience).  - thyroid U/S 2010: 3.1 x 2.6 x 2.2 cm complex, slightly hypoechoic, nodule, situated in the mid pole of the right thyroid lobe - Thyroid ultrasound 02/06/2014: Large nodule occupying most of the right lobe measures 5.2 x 3.2 x 4.1 cm. There are some internal calcifications. It previously measured 3.1 x 2.6 x 2.2 cm based on the prior report. - FNA 37/02/6268: FOLLICULAR NEOPLASM AND/OR LESION (BETHESDA IV)  - Right lobectomy 05/16/2014 and completion thyroidectomy 06/01/2014 Thyroid, lobectomy, right - FOLLICULAR VARIANT OF PAPILLARY THYROID CARCINOMA WITH CAPSULAR INVASION, 4.2 CM, CONFINED WITHIN THYROIDAL TISSUE. - NO EVIDENCE OF ANGIOLYMPHATIC INVASION IDENTIFIED. - RESECTION MARGINS, NEGATIVE FOR ATYPIA OR MALIGNANCY. Microscopic Comment THYROID Specimen: Right thyroid Procedure: Right  hemithyroidectomy Specimen Integrity (intact/fragmented): Intact Tumor focality: Unifocal Maximum tumor size (cm): 4.2 cm, gross description Tumor laterality: Right thyroid gland Histologic type (including subtype and/or unique features as applicable): Follicular variant of papillary thyroid carcinoma Tumor capsule: Present Extrathyroidal extension: No Margins: Negative Lymph - Vascular invasion: Not identified Capsular invasion with degree of invasion if present: Present, complete Lymph nodes: # examined N/A; # positive; N/A TNM code: pT3 , pNX Non-neoplastic thyroid: Unremarkable. Due to the low risk of her thyroid tumor, I did not recommend I-131 remnant ablation.  01/07/2016: Neck ultrasound: Surgical changes of total thyroidectomy without evidence of residual thyroid tissue, nodularity or lymphadenopathy.  Neck U/S (12/14/2019): Isthmus: Surgically absent. There is no residual nodular soft tissue within the isthmic resection bed.   Right lobe: Surgically absent. There is no residual nodular soft tissue within the right lobectomy resection bed.   Left lobe: Surgically absent. There is no residual nodular soft tissue within the left lobectomy resection bed.  _________________________________________________________   No regional cervical lymphadenopathy.   IMPRESSION: Post total thyroidectomy without evidence of residual or locally recurrent disease.  Her thyroglobulin levels are fluctuating: Lab Results  Component Value Date   THYROGLB 0.4 (L) 12/02/2019   THYROGLB 0.2 (L) 06/22/2017   THYROGLB 0.3 (L) 12/22/2016   THYROGLB 0.6 (L) 12/24/2015   THGAB <1 12/02/2019   THGAB <1 06/22/2017   THGAB <1 12/22/2016   THGAB <1 12/24/2015   Pt denies: - feeling nodules in neck - hoarseness - dysphagia - choking - SOB with lying down  Hypothyroidism: Pt. has been dx with hypothyroidism in 2010, now postoperative hypothyroidism; she is  on brand-name Synthroid.  Pt is  on Synthroid 175 mcg daily, taken: (May have missed 1 dose in the last month) - in am - fasting - at least 30 min from b'fast - no Ca, Fe, MVI, stopped PPIs at night - few mo ago - not on Biotin  -On B12 and vitamin D  Reviewed her TFTs: Lab Results  Component Value Date   TSH 6.06 (H) 07/25/2020   TSH 5.52 (H) 12/02/2019   TSH 1.37 06/21/2018   TSH 4.45 01/11/2018   TSH 2.22 06/22/2017   FREET4 1.02 07/25/2020   FREET4 1.02 12/02/2019   FREET4 1.26 06/21/2018   FREET4 0.75 01/11/2018   FREET4 1.01 06/22/2017  03/29/2013: TSH 0.93, fT4 1.03 02/04/2013 (towards end of pregnancy): TSH 0.12, fT4 0.96 >> Synthroid reduced from 100 to 88 mcg 12/2012: TSH 0.07 >> Synthroid reduced from 112 to 100 mcg  Vitamin D insufficiency   She continues on 5000 units vitamin D daily.  Reviewed HbA1c levels: Lab Results  Component Value Date   VD25OH 31.21 07/25/2020   VD25OH 31.0 12/02/2019   VD25OH 32.28 04/26/2019   VD25OH 32.43 01/11/2018   VD25OH 29.92 (L) 06/22/2017   VD25OH 34.67 12/22/2016   VD25OH 23.51 (L) 06/23/2016   VD25OH 9.6 (L) 11/05/2015   In the past, patient complained of palpitations >> started fall 2017 after we last increased her LT4, but exacerbated 06/2016.  Of note, she had full cardiac investigation including a Holter monitor >> PVC and PACs (217 events per 48h).  On metoprolol.  She also has PCOS.  Reviewed latest HbA1c level: Lab Results  Component Value Date   HGBA1C 5.6 07/25/2020   Her husband has type 1 diabetes as she is his caregiver.  He started dialysis.  He was not approved for a pancreatic-kidney transplant. He fell and had vertebral fractures.   ROS: + see HPI  Past Medical History:  Diagnosis Date   Allergy    Anxiety    Cancer ([place])    "thyroid cancer - thyroidectomy January 7510"   Complication of anesthesia    "post operative myalgia"   Depression    Difficult intubation    "due to limited neck mobility"   GERD (gastroesophageal  reflux disease)    "not taking Prilosec any longer"   Hashimoto's thyroiditis 2010   History of bronchitis    History of influenza pneumonia 2012   History of kidney stones    lithotripsy   Migraine    migraines- usually x1 weekly   OSA on CPAP    cpap nightly-settings 16   Papillary carcinoma, follicular variant ([place]) 05/31/2014   PCOS (polycystic ovarian syndrome) 2003   takes Metformin for PCOS   Stress incontinence    Wears glasses    Past Surgical History:  Procedure Laterality Date   ACHILLES TENDON SURGERY N/A 01/29/2016   Procedure: RIGHT ACHILLES  RECONSTRUCTION;  Surgeon: Wylene Simmer, MD;  Location: [place];  Service: Orthopedics;  Laterality: N/A;   EXTRACORPOREAL SHOCK WAVE LITHOTRIPSY Left    GASTROCNEMIUS RECESSION N/A 01/29/2016   Procedure: GASTROC RECESSION POSSIBLE FLEXIBLE FLEXOR HALGUS LONGUS TRANSER;  Surgeon: Wylene Simmer, MD;  Location: [facility];  Service: Orthopedics;  Laterality: N/A;   THYROID LOBECTOMY Right 05/16/2014   Procedure: RIGHT THYROID LOBECTOMY;  Surgeon: Armandina Gemma, MD;  Location: [facility];  Service: General;  Laterality: Right;   THYROIDECTOMY N/A 06/01/2014   Procedure: COMPLETION THYROIDECTOMY;  Surgeon: Armandina Gemma, MD;  Location: [facility];  Service:  General;  Laterality: N/A;   TONSILECTOMY, ADENOIDECTOMY, BILATERAL MYRINGOTOMY AND TUBES     1984 and 1989   Social History   Socioeconomic History   Marital status: Married    Spouse name: Not on file   Number of children: Not on file   Years of education: Not on file   Highest education level: Not on file  Occupational History   Not on file  Tobacco Use   Smoking status: Former    Packs/day: 0.25    Years: 2.00    Pack years: 0.50    Types: Cigarettes    Quit date: 05/26/1998    Years since quitting: 22.5   Smokeless tobacco: Never   Tobacco comments:    "quit smoking in 2000 - smoked for 2 years"   Vaping Use   Vaping Use: Never used  Substance and Sexual Activity   Alcohol use: No     Alcohol/week: 0.0 standard drinks   Drug use: No   Sexual activity: Yes  Other Topics Concern   Not on file  Social History Narrative   Divorced    Lives with 2nd husband and 2 boys (2006, 2014)   Occ: Psychiatric nurse for [employer]   Edu: some college   Social Determinants of Radio broadcast assistant Strain: Not on Art therapist Insecurity: Not on file  Transportation Needs: Not on file  Physical Activity: Not on file  Stress: Not on file  Social Connections: Not on file  Intimate Partner Violence: Not on file   Current Outpatient Medications on File Prior to Visit  Medication Sig Dispense Refill   atorvastatin (LIPITOR) 40 MG tablet Take 1 tablet (40 mg total) by mouth daily. 90 tablet 3   cetirizine (ZYRTEC) 10 MG tablet Take 10 mg by mouth daily.      Cholecalciferol (VITAMIN D3) 5000 units TABS Take 5,000 Units by mouth daily.     co-enzyme Q-10 50 MG capsule Take 1 capsule (50 mg total) by mouth daily. 90 capsule 1   cyclobenzaprine (FLEXERIL) 10 MG tablet Take 0.5-1 tablets (5-10 mg total) by mouth 2 (two) times daily as needed (tension headache - with sedation precautions). 30 tablet 0   fluticasone (FLONASE) 50 MCG/ACT nasal spray Place 2 sprays into both nostrils daily. 48 g 3   metFORMIN (GLUCOPHAGE-XR) 750 MG 24 hr tablet Take 1 tablet (750 mg total) by mouth at bedtime. 90 tablet 3   metoprolol succinate (TOPROL-XL) 100 MG 24 hr tablet Take 1 tablet (100 mg total) by mouth daily. Take with or immediately following a meal. 90 tablet 3   montelukast (SINGULAIR) 10 MG tablet Take 1 tablet (10 mg total) by mouth daily. 90 tablet 3   nystatin-triamcinolone ointment (MYCOLOG) Apply 1 application topically 2 (two) times daily. As needed, for max 10-14 days at a time 30 g 0   omeprazole (PRILOSEC) 40 MG capsule Take 1 capsule (40 mg total) by mouth daily as needed. 90 capsule 1   sertraline (ZOLOFT) 100 MG tablet Take 1.5 tablets (150 mg total) by mouth daily. 135 tablet 3    SYNTHROID 175 MCG tablet Take 1 tablet (175 mcg total) by mouth daily. 65 tablet 0   vitamin B-12 (CYANOCOBALAMIN) 100 MCG tablet Take 100 mcg by mouth daily.     No current facility-administered medications on file prior to visit.   Allergies  Allergen Reactions   Tioconazole Itching, Swelling and Other (See Comments)    Burning (severe)   Tape  Hives and Swelling    Adhesive tapes   Buspar [Buspirone]     Worsening mood   Family History  Problem Relation Age of Onset   Thyroid disease Mother        thyroid goiter   Depression Mother    Hyperlipidemia Mother    Hypertension Mother    CAD Mother 70       triple bypass   Hyperlipidemia Father    Hypertension Father    Sleep apnea Father    CAD Father 59       MI, stent   Asthma Brother    Depression Brother    Heart disease Maternal Grandmother    Hyperlipidemia Maternal Grandmother    Heart disease Maternal Grandfather    Hyperlipidemia Maternal Grandfather    Heart disease Paternal Grandmother    Hyperlipidemia Paternal Grandmother    Heart disease Paternal Grandfather    Hyperlipidemia Paternal Grandfather    Diabetes Paternal Grandfather    Sleep apnea Paternal Grandfather    Cancer Neg Hx     PE: BP 128/88 (BP Location: Right Arm, Patient Position: Sitting, Cuff Size: Normal)   Pulse 86   Ht 5' 2" (1.575 m)   Wt (!) 367 lb 6.4 oz (166.7 kg)   SpO2 95%   BMI 67.20 kg/m  Wt Readings from Last 3 Encounters:  12/06/20 (!) 367 lb 6.4 oz (166.7 kg)  07/30/20 (!) 361 lb 5 oz (163.9 kg)  12/02/19 (!) 362 lb 6 oz (164.4 kg)   Constitutional: overweight, in NAD Eyes: PERRLA, EOMI, no exophthalmos ENT: moist mucous membranes, no neck masses palpated, no cervical lymphadenopathy Cardiovascular: RRR, No MRG Respiratory: CTA B Gastrointestinal: abdomen soft, NT, ND, BS+ Musculoskeletal: no deformities, strength intact in all 4 Skin: moist, warm, no rashes Neurological: no tremor with outstretched hands, DTR normal  in all 4  ASSESSMENT: 1. Follicular variant of PTC  2. Postsurgical hypothyroidism  3. Vitamin D deficiency  PLAN:  1. PTC - Patient with follicular variant of papillary thyroid cancer, the least aggressive type of cancer.  This was encapsulated and it did not spread outside the thyroid she is stage I TNM-with very good prognosis -Denies neck compression symptoms -We reviewed the latest neck ultrasound obtained after last visit and this did not show any suspicious masses she did -At last visit, thyroglobulin was 0.4, slightly higher than before, but we discussed that the variability of thyroglobulin over time is expected in the absence of RAI treatment.  Her ATA antibodies were not elevated. -We will recheck her ATA and thyroglobulin level -I we will see her back in 1 year  2. Hypothyroidism - latest thyroid labs reviewed with pt. >> TSH was elevated at last check by PCP: Lab Results  Component Value Date   TSH 6.06 (H) 07/25/2020  - she continues on LT4 175 mcg daily - pt complains of fatigue, which she attributes to chronic stress. - we discussed about taking the thyroid hormone every day, with water, >30 minutes before breakfast, separated by >4 hours from acid reflux medications, calcium, iron, multivitamins. Pt. is taking it correctly.  She missed 1 dose in the last - will check thyroid tests today: TSH and fT4 - If labs are abnormal, she will need to return for repeat TFTs in 1.5 months  3. Vitamin D deficiency -Reviewed vitamin D level from last visit and this was normal, at 31 4 months ago. -She continues on 5000 units vitamin D daily -No generalized or  joint pains  Needs refills.  Component     Latest Ref Rng & Units 12/06/2020  Thyroglobulin     ng/mL 0.3 (L)  T4,Free(Direct)     0.60 - 1.60 ng/dL 0.92  Thyroglobulin Ab     < or = 1 IU/mL <1  TSH     0.35 - 5.50 uIU/mL 3.11  Thyroglobulin level is slightly better, and very low.  TSH is now normal, but are higher  than goal due to the history of papillary thyroid cancer.  I would suggest to increase the dose to 200 mcg daily and have her back for labs in 1.5 months.    Philemon Kingdom, MD PhD [facility]

## 2020-12-07 LAB — THYROGLOBULIN ANTIBODY: Thyroglobulin Ab: <1 IU/mL (ref <=1)

## 2020-12-07 LAB — THYROGLOBULIN LEVEL: Thyroglobulin: 0.3 ng/mL — ABNORMAL LOW

## 2020-12-07 MED ORDER — SYNTHROID 200 MCG PO TABS: 200.0000 ug | ORAL_TABLET | Freq: Every day | ORAL | 3 refills | Status: DC

## 2020-12-14 ENCOUNTER — Ambulatory Visit
Admission: RE | Admit: 2020-12-14 | Discharge: 2020-12-14 | Disposition: A | Discharge status: Home / Self Care | Source: Ambulatory Visit | Attending: Family Medicine | Admitting: Family Medicine

## 2020-12-14 ENCOUNTER — Other Ambulatory Visit: Payer: Self-pay

## 2020-12-14 DIAGNOSIS — Z1231 Encounter for screening mammogram for malignant neoplasm of breast: Secondary | ICD-10-CM

## 2021-02-05 ENCOUNTER — Ambulatory Visit: Admitting: Family Medicine

## 2021-04-08 ENCOUNTER — Encounter: Payer: Self-pay | Admitting: Family Medicine

## 2021-04-09 MED ORDER — ATORVASTATIN CALCIUM 40 MG PO TABS: 40.0000 mg | ORAL_TABLET | Freq: Every day | ORAL | 1 refills | Status: DC

## 2021-04-09 NOTE — Telephone Encounter (Signed)
E-scribed refill 

## 2021-04-26 ENCOUNTER — Encounter: Payer: Self-pay | Admitting: Internal Medicine

## 2021-05-03 ENCOUNTER — Encounter: Payer: Self-pay | Admitting: Family Medicine

## 2021-05-03 ENCOUNTER — Ambulatory Visit (INDEPENDENT_AMBULATORY_CARE_PROVIDER_SITE_OTHER): Admitting: Family Medicine

## 2021-05-03 ENCOUNTER — Other Ambulatory Visit: Payer: Self-pay

## 2021-05-03 VITALS — BP 122/84 | HR 76 | Temp 97.5°F | Ht 62.0 in | Wt 365.4 lb

## 2021-05-03 DIAGNOSIS — K529 Noninfective gastroenteritis and colitis, unspecified: Secondary | ICD-10-CM

## 2021-05-03 DIAGNOSIS — R1013 Epigastric pain: Secondary | ICD-10-CM

## 2021-05-03 DIAGNOSIS — E282 Polycystic ovarian syndrome: Secondary | ICD-10-CM

## 2021-05-03 DIAGNOSIS — F331 Major depressive disorder, recurrent, moderate: Secondary | ICD-10-CM | POA: Diagnosis not present

## 2021-05-03 DIAGNOSIS — E890 Postprocedural hypothyroidism: Secondary | ICD-10-CM

## 2021-05-03 DIAGNOSIS — Z23 Encounter for immunization: Secondary | ICD-10-CM

## 2021-05-03 DIAGNOSIS — Z6379 Other stressful life events affecting family and household: Secondary | ICD-10-CM

## 2021-05-03 DIAGNOSIS — K760 Fatty (change of) liver, not elsewhere classified: Secondary | ICD-10-CM

## 2021-05-03 DIAGNOSIS — Z6841 Body Mass Index (BMI) 40.0 and over, adult: Secondary | ICD-10-CM

## 2021-05-03 DIAGNOSIS — G8929 Other chronic pain: Secondary | ICD-10-CM

## 2021-05-03 LAB — COMPREHENSIVE METABOLIC PANEL
ALT: 18 U/L (ref 0–35)
AST: 18 U/L (ref 0–37)
Albumin: 4 g/dL (ref 3.5–5.2)
Alkaline Phosphatase: 95 U/L (ref 39–117)
BUN: 10 mg/dL (ref 6–23)
CO2: 26 mEq/L (ref 19–32)
Calcium: 8.8 mg/dL (ref 8.4–10.5)
Chloride: 104 mEq/L (ref 96–112)
Creatinine, Ser: 0.62 mg/dL (ref 0.40–1.20)
GFR: 109.82 mL/min (ref >60.00)
Glucose, Bld: 103 mg/dL — ABNORMAL HIGH (ref 70–99)
Potassium: 4.1 mEq/L (ref 3.5–5.1)
Sodium: 137 mEq/L (ref 135–145)
Total Bilirubin: 0.4 mg/dL (ref 0.2–1.2)
Total Protein: 7.5 g/dL (ref 6.0–8.3)

## 2021-05-03 LAB — CBC WITH DIFFERENTIAL/PLATELET
Basophils Absolute: 0 10*3/uL (ref 0.0–0.1)
Basophils Relative: 0.4 % (ref 0.0–3.0)
Eosinophils Absolute: 0.1 10*3/uL (ref 0.0–0.7)
Eosinophils Relative: 1.5 % (ref 0.0–5.0)
HCT: 40.5 % (ref 36.0–46.0)
Hemoglobin: 13.3 g/dL (ref 12.0–15.0)
Lymphocytes Relative: 30.2 % (ref 12.0–46.0)
Lymphs Abs: 2.6 10*3/uL (ref 0.7–4.0)
MCHC: 32.8 g/dL (ref 30.0–36.0)
MCV: 88.7 fl (ref 78.0–100.0)
Monocytes Absolute: 0.4 10*3/uL (ref 0.1–1.0)
Monocytes Relative: 4.9 % (ref 3.0–12.0)
Neutro Abs: 5.5 10*3/uL (ref 1.4–7.7)
Neutrophils Relative %: 63 % (ref 43.0–77.0)
Platelets: 257 10*3/uL (ref 150.0–400.0)
RBC: 4.57 Mil/uL (ref 3.87–5.11)
RDW: 14.4 % (ref 11.5–15.5)
WBC: 8.8 10*3/uL (ref 4.0–10.5)

## 2021-05-03 LAB — LIPASE: Lipase: 20 U/L (ref 11.0–59.0)

## 2021-05-03 MED ORDER — SERTRALINE HCL 100 MG PO TABS: 100.0000 mg | ORAL_TABLET | Freq: Every day | ORAL | 1 refills | Status: DC

## 2021-05-03 MED ORDER — BUPROPION HCL ER (SR) 100 MG PO TB12: 100.0000 mg | ORAL_TABLET | Freq: Every day | ORAL | 1 refills | Status: DC

## 2021-05-03 MED ORDER — OMEPRAZOLE 40 MG PO CPDR: 40.0000 mg | DELAYED_RELEASE_CAPSULE | Freq: Every day | ORAL | 1 refills | Status: DC | PRN

## 2021-05-03 NOTE — Assessment & Plan Note (Addendum)
Longstanding issue, seems more than just IBS-D.  Discussed possible contributors including metformin XR and SSRI - see below.  Finds probiotic has so far been helpful to regularize bowel movements.  Check fecal elastase.

## 2021-05-03 NOTE — Progress Notes (Signed)
Patient ID: Linda Welch, female    DOB: [birth year], 40 y.o.   MRN: [id number]  This visit was conducted in person.  BP 122/84   Pulse 76   Temp (!) 97.5 F (36.4 C) (Temporal)   Ht 5\' 2"  (1.575 m)   Wt (!) 365 lb 6 oz (165.7 kg)   LMP 04/03/2021   SpO2 97%   BMI 66.83 kg/m    CC: abd discomfort Subjective:   HPI: Linda Welch is a 40 y.o. female presenting on 05/03/2021 for Abdominal Pain (C/o abd pain, chest discomfort, excess gas, bloating and irregular BMs.  H/o sxs for 20+ yrs, worsening. )   Longstanding issue with chronic diarrhea for years - recently worsening. 3-4 loose watery stools/day (bright yellow). Not increased buoyancy or blood in stool. Also describes ongoing epigastric pressure pain affecting ability to eat, also with gas, bloating. Pressure pain doesn't improve with BM. Also sometimes feels solid foods get stuck, no trouble to liquids. Increased nausea, has vomited once 2 wks ago with improvement in symptoms afterwards. Does get IBS type cramping that does improve with BM.   Notes intolerance to eggs, iceburg lettuce, fried food, hamburgers.  Already avoids dairy/lactose - drinks almond milk.   No reflux symptoms or heartburn.  No fevers/chills, unexpected weight loss, early satiety, hematemesis.   She did recently restart prilosec OTC 20mg  about a week ago with benefit.  She did also recently start seed prebiotic/probiotic - seems to be normalizing bowel movements.  She also recently increased fiber in diet (granola, hemp hearts).   Abd [place] 07/2019 - fatty liver changes, no gallstones   Interested in GI referral.      Relevant past medical, surgical, family and social history reviewed and updated as indicated. Interim medical history since our last visit reviewed. Allergies and medications reviewed and updated. Outpatient Medications Prior to Visit  Medication Sig Dispense Refill   atorvastatin (LIPITOR) 40 MG tablet Take 1 tablet (40 mg total) by  mouth daily. 90 tablet 1   Cholecalciferol (VITAMIN D3) 5000 units TABS Take 5,000 Units by mouth daily.     co-enzyme Q-10 50 MG capsule Take 1 capsule (50 mg total) by mouth daily. 90 capsule 1   cyclobenzaprine (FLEXERIL) 10 MG tablet Take 0.5-1 tablets (5-10 mg total) by mouth 2 (two) times daily as needed (tension headache - with sedation precautions). 30 tablet 0   fluticasone (FLONASE) 50 MCG/ACT nasal spray Place 2 sprays into both nostrils daily. 48 g 3   levocetirizine (XYZAL) 5 MG tablet      metFORMIN (GLUCOPHAGE-XR) 750 MG 24 hr tablet Take 1 tablet (750 mg total) by mouth at bedtime. 90 tablet 3   metoprolol succinate (TOPROL-XL) 100 MG 24 hr tablet Take 1 tablet (100 mg total) by mouth daily. Take with or immediately following a meal. 90 tablet 3   montelukast (SINGULAIR) 10 MG tablet Take 1 tablet (10 mg total) by mouth daily. 90 tablet 3   nystatin-triamcinolone ointment (MYCOLOG) Apply 1 application topically 2 (two) times daily. As needed, for max 10-14 days at a time 30 g 0   SYNTHROID 200 MCG tablet Take 1 tablet (200 mcg total) by mouth daily before breakfast. 90 tablet 3   vitamin B-12 (CYANOCOBALAMIN) 100 MCG tablet Take 100 mcg by mouth daily.     omeprazole (PRILOSEC) 40 MG capsule Take 1 capsule (40 mg total) by mouth daily as needed. 90 capsule 1   sertraline (ZOLOFT) 100 MG  tablet Take 1.5 tablets (150 mg total) by mouth daily. 135 tablet 3   cetirizine (ZYRTEC) 10 MG tablet Take 10 mg by mouth daily.      No facility-administered medications prior to visit.     Per HPI unless specifically indicated in ROS section below Review of Systems  Objective:  BP 122/84   Pulse 76   Temp (!) 97.5 F (36.4 C) (Temporal)   Ht 5\' 2"  (1.575 m)   Wt (!) 365 lb 6 oz (165.7 kg)   LMP 04/03/2021   SpO2 97%   BMI 66.83 kg/m   Wt Readings from Last 3 Encounters:  05/03/21 (!) 365 lb 6 oz (165.7 kg)  12/06/20 (!) 367 lb 6.4 oz (166.7 kg)  07/30/20 (!) 361 lb 5 oz (163.9  kg)      Physical Exam Vitals and nursing note reviewed.  Constitutional:      Appearance: Normal appearance. She is obese. She is not ill-appearing.  Cardiovascular:     Rate and Rhythm: Normal rate and regular rhythm.     Pulses: Normal pulses.     Heart sounds: Normal heart sounds. No murmur heard. Pulmonary:     Effort: Pulmonary effort is normal. No respiratory distress.     Breath sounds: Normal breath sounds. No wheezing, rhonchi or rales.  Abdominal:     General: Bowel sounds are normal. There is no distension.     Palpations: Abdomen is soft. There is no mass.     Tenderness: There is abdominal tenderness (moderate) in the epigastric area. There is no right CVA tenderness, left CVA tenderness, guarding or rebound. Negative signs include Murphy's sign.     Hernia: No hernia is present.  Musculoskeletal:     Right lower leg: No edema.     Left lower leg: No edema.  Skin:    General: Skin is warm and dry.  Neurological:     Mental Status: She is alert.  Psychiatric:        Mood and Affect: Mood normal.        Behavior: Behavior normal.      Results for orders placed or performed in visit on 05/03/21  Comprehensive metabolic panel  Result Value Ref Range   Sodium 137 135 - 145 mEq/L   Potassium 4.1 3.5 - 5.1 mEq/L   Chloride 104 96 - 112 mEq/L   CO2 26 19 - 32 mEq/L   Glucose, Bld 103 (H) 70 - 99 mg/dL   BUN 10 6 - 23 mg/dL   Creatinine, Ser 0.62 0.40 - 1.20 mg/dL   Total Bilirubin 0.4 0.2 - 1.2 mg/dL   Alkaline Phosphatase 95 39 - 117 U/L   AST 18 0 - 37 U/L   ALT 18 0 - 35 U/L   Total Protein 7.5 6.0 - 8.3 g/dL   Albumin 4.0 3.5 - 5.2 g/dL   GFR 109.82 >60.00 mL/min   Calcium 8.8 8.4 - 10.5 mg/dL  CBC with Differential/Platelet  Result Value Ref Range   WBC 8.8 4.0 - 10.5 K/uL   RBC 4.57 3.87 - 5.11 Mil/uL   Hemoglobin 13.3 12.0 - 15.0 g/dL   HCT 40.5 36.0 - 46.0 %   MCV 88.7 78.0 - 100.0 fl   MCHC 32.8 30.0 - 36.0 g/dL   RDW 14.4 11.5 - 15.5 %    Platelets 257.0 150.0 - 400.0 K/uL   Neutrophils Relative % 63.0 43.0 - 77.0 %   Lymphocytes Relative 30.2 12.0 - 46.0 %  Monocytes Relative 4.9 3.0 - 12.0 %   Eosinophils Relative 1.5 0.0 - 5.0 %   Basophils Relative 0.4 0.0 - 3.0 %   Neutro Abs 5.5 1.4 - 7.7 K/uL   Lymphs Abs 2.6 0.7 - 4.0 K/uL   Monocytes Absolute 0.4 0.1 - 1.0 K/uL   Eosinophils Absolute 0.1 0.0 - 0.7 K/uL   Basophils Absolute 0.0 0.0 - 0.1 K/uL  Lipase  Result Value Ref Range   Lipase 20.0 11.0 - 59.0 U/L    Assessment & Plan:  This visit occurred during the SARS-CoV-2 public health emergency.  Safety protocols were in place, including screening questions prior to the visit, additional usage of staff PPE, and extensive cleaning of exam room while observing appropriate contact time as indicated for disinfecting solutions.   Problem List Items Addressed This Visit     PCOS (polycystic ovarian syndrome)    Chronically on metformin which helps regulate cycles.       MDD (major depressive disorder), recurrent episode, moderate (HCC)    High stress as husband's primary caregiver. Also notes decreased energy levels.  Discussed possible relation of SSRI with chronic diarrhea - will drop dose to 100mg  daily and add on wellbutrin SR 100mg  daily, update with effect.  No h/o seizures.       Relevant Medications   sertraline (ZOLOFT) 100 MG tablet   buPROPion ER (WELLBUTRIN SR) 100 MG 12 hr tablet   Postsurgical hypothyroidism    Followed by endo Dr Cruzita Lederer.      BMI 60.0-69.9, adult [facility])   Stress due to illness of family member   Chronic epigastric pain - Primary    Longstanding for years, again seems to be worsening. OTC prilosec OTC seems to be helping. She has also implemented healthy changes over the past 2 weeks including starting fiber routine and starting daily probiotics. Will increase PPI to full dose 40mg  omeprazole daily for 3 wks. Given chronicity of symptoms, will also refer to GI for further  evaluation per pt request.       Relevant Medications   sertraline (ZOLOFT) 100 MG tablet   buPROPion ER (WELLBUTRIN SR) 100 MG 12 hr tablet   Fatty liver disease, nonalcoholic    Update LFTs.       Relevant Orders   Comprehensive metabolic panel (Completed)   Chronic diarrhea    Longstanding issue, seems more than just IBS-D.  Discussed possible contributors including metformin XR and SSRI - see below.  Finds probiotic has so far been helpful to regularize bowel movements.  Check fecal elastase.       Relevant Orders   Pancreatic Elastase, Fecal   Comprehensive metabolic panel (Completed)   CBC with Differential/Platelet (Completed)   Lipase (Completed)   Other Visit Diagnoses     Need for influenza vaccination       Relevant Orders   Flu Vaccine QUAD 40mo+IM (Fluarix, Fluzone & Alfiuria Quad PF) (Completed)        Meds ordered this encounter  Medications   omeprazole (PRILOSEC) 40 MG capsule    Sig: Take 1 capsule (40 mg total) by mouth daily as needed.    Dispense:  90 capsule    Refill:  1    Daily for 3 weeks then as needed   sertraline (ZOLOFT) 100 MG tablet    Sig: Take 1 tablet (100 mg total) by mouth daily.    Dispense:  90 tablet    Refill:  1   buPROPion ER [facility] SR)  100 MG 12 hr tablet    Sig: Take 1 tablet (100 mg total) by mouth daily.    Dispense:  90 tablet    Refill:  1   Orders Placed This Encounter  Procedures   Flu Vaccine QUAD 76mo+IM (Fluarix, Fluzone & Alfiuria Quad PF)   Pancreatic Elastase, Fecal   Comprehensive metabolic panel   CBC with Differential/Platelet   Lipase     Patient Instructions  Flu shot today  Labs today Drop sertraline to 100mg  daily, add wellbutrin SR 100mg  once daily for mood and energy.  Take omeprazole 40mg  daily for 3 weeks then as needed.  Continue probiotic and fiber.  We will also refer you to GI.  Return in 3 months for physical  Follow up plan: Return in about 3 months (around 08/01/2021), or  if symptoms worsen or fail to improve, for annual exam, prior fasting for blood work.  Ria Bush, MD

## 2021-05-03 NOTE — Assessment & Plan Note (Addendum)
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 Also notes decreased energy levels.  Discussed possible relation of SSRI with chronic diarrhea - will drop dose to 100mg  daily and add on wellbutrin SR 100mg  daily, update with effect.  No h/o seizures.

## 2021-05-03 NOTE — Patient Instructions (Addendum)
Flu shot today  Labs today Drop sertraline to 100mg  daily, add wellbutrin SR 100mg  once daily for mood and energy.  Take omeprazole 40mg  daily for 3 weeks then as needed.  Continue probiotic and fiber.  We will also refer you to GI.  Return in 3 months for physical

## 2021-05-03 NOTE — Assessment & Plan Note (Signed)
Chronically on metformin which helps regulate cycles.

## 2021-05-03 NOTE — Assessment & Plan Note (Signed)
Update LFT's 

## 2021-05-03 NOTE — Assessment & Plan Note (Signed)
Followed by endo Dr Cruzita Lederer.

## 2021-05-03 NOTE — Assessment & Plan Note (Signed)
Longstanding for years, again seems to be worsening. OTC prilosec OTC seems to be helping. She has also implemented healthy changes over the past 2 weeks including starting fiber routine and starting daily probiotics. Will increase PPI to full dose 40mg  omeprazole daily for 3 wks. Given chronicity of symptoms, will also refer to GI for further evaluation per pt request.

## 2021-05-06 NOTE — Addendum Note (Signed)
Addended by: Ellamae Sia on: 05/06/2021 08:32 AM   Modules accepted: Orders

## 2021-05-31 ENCOUNTER — Other Ambulatory Visit: Payer: Self-pay | Admitting: Family Medicine

## 2021-06-04 ENCOUNTER — Other Ambulatory Visit: Payer: Self-pay | Admitting: Family Medicine

## 2021-06-04 MED ORDER — CYCLOBENZAPRINE HCL 10 MG PO TABS: 5.0000 mg | ORAL_TABLET | Freq: Two times a day (BID) | ORAL | 0 refills | Status: DC | PRN

## 2021-06-05 NOTE — Telephone Encounter (Signed)
Refill request flexeril Last refill 06/04/21 #30 Last office visit 05/03/21

## 2021-06-06 MED ORDER — CYCLOBENZAPRINE HCL 10 MG PO TABS: 5.0000 mg | ORAL_TABLET | Freq: Two times a day (BID) | ORAL | 0 refills | Status: DC | PRN

## 2021-08-04 ENCOUNTER — Other Ambulatory Visit: Payer: Self-pay | Admitting: Family Medicine

## 2021-08-04 DIAGNOSIS — Z1159 Encounter for screening for other viral diseases: Secondary | ICD-10-CM

## 2021-08-04 DIAGNOSIS — E559 Vitamin D deficiency, unspecified: Secondary | ICD-10-CM

## 2021-08-04 DIAGNOSIS — E785 Hyperlipidemia, unspecified: Secondary | ICD-10-CM

## 2021-08-04 DIAGNOSIS — E890 Postprocedural hypothyroidism: Secondary | ICD-10-CM

## 2021-08-04 DIAGNOSIS — R7303 Prediabetes: Secondary | ICD-10-CM

## 2021-08-04 DIAGNOSIS — E282 Polycystic ovarian syndrome: Secondary | ICD-10-CM

## 2021-08-04 DIAGNOSIS — Z148 Genetic carrier of other disease: Secondary | ICD-10-CM

## 2021-08-06 ENCOUNTER — Other Ambulatory Visit

## 2021-08-13 ENCOUNTER — Encounter: Admitting: Family Medicine

## 2021-09-09 ENCOUNTER — Other Ambulatory Visit: Payer: Self-pay | Admitting: Family Medicine

## 2021-09-09 DIAGNOSIS — E890 Postprocedural hypothyroidism: Secondary | ICD-10-CM

## 2021-09-10 ENCOUNTER — Other Ambulatory Visit (INDEPENDENT_AMBULATORY_CARE_PROVIDER_SITE_OTHER)

## 2021-09-10 DIAGNOSIS — E785 Hyperlipidemia, unspecified: Secondary | ICD-10-CM | POA: Diagnosis not present

## 2021-09-10 DIAGNOSIS — E559 Vitamin D deficiency, unspecified: Secondary | ICD-10-CM | POA: Diagnosis not present

## 2021-09-10 DIAGNOSIS — Z148 Genetic carrier of other disease: Secondary | ICD-10-CM

## 2021-09-10 DIAGNOSIS — R7303 Prediabetes: Secondary | ICD-10-CM | POA: Diagnosis not present

## 2021-09-10 DIAGNOSIS — Z1159 Encounter for screening for other viral diseases: Secondary | ICD-10-CM

## 2021-09-10 LAB — LIPID PANEL
Cholesterol: 132 mg/dL (ref 0–200)
HDL: 42 mg/dL
LDL Cholesterol: 63 mg/dL (ref 0–99)
NonHDL: 89.89
Total CHOL/HDL Ratio: 3
Triglycerides: 136 mg/dL (ref 0.0–149.0)
VLDL: 27.2 mg/dL (ref 0.0–40.0)

## 2021-09-10 LAB — COMPREHENSIVE METABOLIC PANEL WITH GFR
ALT: 27 U/L (ref 0–35)
AST: 27 U/L (ref 0–37)
Albumin: 4 g/dL (ref 3.5–5.2)
Alkaline Phosphatase: 96 U/L (ref 39–117)
BUN: 9 mg/dL (ref 6–23)
CO2: 27 meq/L (ref 19–32)
Calcium: 8.3 mg/dL — ABNORMAL LOW (ref 8.4–10.5)
Chloride: 101 meq/L (ref 96–112)
Creatinine, Ser: 0.56 mg/dL (ref 0.40–1.20)
GFR: 112.27 mL/min
Glucose, Bld: 127 mg/dL — ABNORMAL HIGH (ref 70–99)
Potassium: 3.9 meq/L (ref 3.5–5.1)
Sodium: 138 meq/L (ref 135–145)
Total Bilirubin: 0.4 mg/dL (ref 0.2–1.2)
Total Protein: 7.3 g/dL (ref 6.0–8.3)

## 2021-09-10 LAB — CBC WITH DIFFERENTIAL/PLATELET
Basophils Absolute: 0 10*3/uL (ref 0.0–0.1)
Basophils Relative: 0.5 % (ref 0.0–3.0)
Eosinophils Absolute: 0.1 10*3/uL (ref 0.0–0.7)
Eosinophils Relative: 2 % (ref 0.0–5.0)
HCT: 38.3 % (ref 36.0–46.0)
Hemoglobin: 12.5 g/dL (ref 12.0–15.0)
Lymphocytes Relative: 35.6 % (ref 12.0–46.0)
Lymphs Abs: 2.6 10*3/uL (ref 0.7–4.0)
MCHC: 32.7 g/dL (ref 30.0–36.0)
MCV: 86.3 fl (ref 78.0–100.0)
Monocytes Absolute: 0.4 10*3/uL (ref 0.1–1.0)
Monocytes Relative: 5.6 % (ref 3.0–12.0)
Neutro Abs: 4.1 10*3/uL (ref 1.4–7.7)
Neutrophils Relative %: 56.3 % (ref 43.0–77.0)
Platelets: 257 10*3/uL (ref 150.0–400.0)
RBC: 4.44 Mil/uL (ref 3.87–5.11)
RDW: 15.3 % (ref 11.5–15.5)
WBC: 7.3 10*3/uL (ref 4.0–10.5)

## 2021-09-10 LAB — VITAMIN D 25 HYDROXY (VIT D DEFICIENCY, FRACTURES): VITD: 34.98 ng/mL (ref 30.00–100.00)

## 2021-09-10 LAB — HEMOGLOBIN A1C: Hgb A1c MFr Bld: 6.3 % (ref 4.6–6.5)

## 2021-09-11 LAB — HEPATITIS C ANTIBODY
Hepatitis C Ab: NONREACTIVE
SIGNAL TO CUT-OFF: 0.09 (ref <1.00)

## 2021-09-17 ENCOUNTER — Ambulatory Visit (INDEPENDENT_AMBULATORY_CARE_PROVIDER_SITE_OTHER): Admitting: Family Medicine

## 2021-09-17 ENCOUNTER — Encounter: Payer: Self-pay | Admitting: Family Medicine

## 2021-09-17 VITALS — BP 124/82 | HR 91 | Temp 97.8°F | Ht 62.0 in | Wt 366.5 lb

## 2021-09-17 DIAGNOSIS — Z9989 Dependence on other enabling machines and devices: Secondary | ICD-10-CM

## 2021-09-17 DIAGNOSIS — G8929 Other chronic pain: Secondary | ICD-10-CM

## 2021-09-17 DIAGNOSIS — E559 Vitamin D deficiency, unspecified: Secondary | ICD-10-CM

## 2021-09-17 DIAGNOSIS — Z0001 Encounter for general adult medical examination with abnormal findings: Secondary | ICD-10-CM

## 2021-09-17 DIAGNOSIS — R1013 Epigastric pain: Secondary | ICD-10-CM | POA: Diagnosis not present

## 2021-09-17 DIAGNOSIS — E890 Postprocedural hypothyroidism: Secondary | ICD-10-CM

## 2021-09-17 DIAGNOSIS — E785 Hyperlipidemia, unspecified: Secondary | ICD-10-CM

## 2021-09-17 DIAGNOSIS — G4733 Obstructive sleep apnea (adult) (pediatric): Secondary | ICD-10-CM

## 2021-09-17 DIAGNOSIS — Z148 Genetic carrier of other disease: Secondary | ICD-10-CM

## 2021-09-17 DIAGNOSIS — Z6379 Other stressful life events affecting family and household: Secondary | ICD-10-CM

## 2021-09-17 DIAGNOSIS — Z9889 Other specified postprocedural states: Secondary | ICD-10-CM

## 2021-09-17 DIAGNOSIS — E282 Polycystic ovarian syndrome: Secondary | ICD-10-CM

## 2021-09-17 DIAGNOSIS — K529 Noninfective gastroenteritis and colitis, unspecified: Secondary | ICD-10-CM

## 2021-09-17 DIAGNOSIS — R7303 Prediabetes: Secondary | ICD-10-CM

## 2021-09-17 DIAGNOSIS — K760 Fatty (change of) liver, not elsewhere classified: Secondary | ICD-10-CM

## 2021-09-17 DIAGNOSIS — F331 Major depressive disorder, recurrent, moderate: Secondary | ICD-10-CM

## 2021-09-17 DIAGNOSIS — Z6841 Body Mass Index (BMI) 40.0 and over, adult: Secondary | ICD-10-CM

## 2021-09-17 LAB — IBC PANEL
Iron: 54 ug/dL (ref 42–145)
Saturation Ratios: 12.6 % — ABNORMAL LOW (ref 20.0–50.0)
TIBC: 427 ug/dL (ref 250.0–450.0)
Transferrin: 305 mg/dL (ref 212.0–360.0)

## 2021-09-17 LAB — FERRITIN: Ferritin: 24.3 ng/mL (ref 10.0–291.0)

## 2021-09-17 MED ORDER — METOPROLOL SUCCINATE ER 100 MG PO TB24: 100.0000 mg | ORAL_TABLET | Freq: Every day | ORAL | 3 refills | Status: DC

## 2021-09-17 MED ORDER — OMEPRAZOLE 40 MG PO CPDR: 40.0000 mg | DELAYED_RELEASE_CAPSULE | Freq: Every day | ORAL | 6 refills | Status: DC | PRN

## 2021-09-17 MED ORDER — CYCLOBENZAPRINE HCL 10 MG PO TABS: 5.0000 mg | ORAL_TABLET | Freq: Two times a day (BID) | ORAL | 0 refills | Status: DC | PRN

## 2021-09-17 MED ORDER — BUPROPION HCL ER (XL) 150 MG PO TB24: 150.0000 mg | ORAL_TABLET | Freq: Every day | ORAL | 3 refills | Status: DC

## 2021-09-17 MED ORDER — SERTRALINE HCL 100 MG PO TABS: 100.0000 mg | ORAL_TABLET | Freq: Every day | ORAL | 3 refills | Status: DC

## 2021-09-17 MED ORDER — FLUTICASONE PROPIONATE 50 MCG/ACT NA SUSP: 2.0000 | Freq: Every day | NASAL | 3 refills | Status: DC

## 2021-09-17 MED ORDER — METFORMIN HCL ER 750 MG PO TB24: 750.0000 mg | ORAL_TABLET | Freq: Every day | ORAL | 3 refills | Status: DC

## 2021-09-17 MED ORDER — ROSUVASTATIN CALCIUM 10 MG PO TABS: 10.0000 mg | ORAL_TABLET | Freq: Every day | ORAL | 3 refills | Status: DC

## 2021-09-17 MED ORDER — BUPROPION HCL ER (SR) 100 MG PO TB12: 100.0000 mg | ORAL_TABLET | Freq: Every day | ORAL | 3 refills | Status: DC

## 2021-09-17 MED ORDER — MONTELUKAST SODIUM 10 MG PO TABS: 10.0000 mg | ORAL_TABLET | Freq: Every day | ORAL | 3 refills | Status: DC

## 2021-09-17 NOTE — Assessment & Plan Note (Signed)
No significant benefit with addition of wellbutrin. Would have trouble remembering BID medication - will change wellbutrin SR to XL '150mg'$  daily, continue sertraline '100mg'$  daily.  ?

## 2021-09-17 NOTE — Assessment & Plan Note (Signed)
LFTs normal

## 2021-09-17 NOTE — Assessment & Plan Note (Signed)
Chronic, marked improvement since starting atorvastatin '40mg'$  daily however she remains worried about possible long term risks of statin on memory. Would like to switch statins - will send in rosuvastatin '10mg'$  daily.  ?The 10-year ASCVD risk score (Arnett DK, et al., 2019) is: 0.9% ?  Values used to calculate the score: ?    Age: 41 years ?    Sex: Female ?    Is Non-Hispanic African American: No ?    Diabetic: Yes ?    Tobacco smoker: No ?    Systolic Blood Pressure: 269 mmHg ?    Is BP treated: No ?    HDL Cholesterol: 42 mg/dL ?    Total Cholesterol: 132 mg/dL  ?

## 2021-09-17 NOTE — Progress Notes (Addendum)
? ? Patient ID: Linda Welch, female    DOB: Aug 22, 1980, 41 y.o.   MRN: [id number] ? ?This visit was conducted in person. ? ?BP 124/82   Pulse 91   Temp 97.8 ?F (36.6 ?C) (Temporal)   Ht '5\' 2"'$  (1.575 m)   Wt (!) 366 lb 8 oz (166.2 kg)   LMP 09/02/2021   SpO2 96%   BMI 67.03 kg/m?   ? ?CC: CPE ?Subjective:  ? ?HPI: ?LILLIAS Welch is a 41 y.o. female presenting on 09/17/2021 for Annual Exam ? ? ?Ongoing stress due to family deaths/illnesses - grandmother then cousin died in the past 2 months, aunt passed away last week, husband fell at dialysis and broke his neck - he's currently in the hospital s/p extensive neck surgery.  ? 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 Due for f/u.  ? ?Chronic diarrhea and epigastric discomfort - has not yet seen GI. Last visit we dropped sertraline to '100mg'$  daily and started wellbutrin SR '100mg'$  daily - unsure if this helped any. Seed pre/probiotic has really regularize bowel movements - if she misses probiotic, diarrhea returns. Describes yellow watery stool worse with certain triggers (ie lettuce). Sometimes simethicone helps.  ?Fecal elastase never resulted.  ?Notes ongoing bloating, swelling and epigastric discomfort described as dull ache, no pressure tightness, not exertional or relieved by rest. She does note new exertional dyspnea.  ?She previously saw Dr Ubaldo Glassing at [place]. ETT normal 2019 per report.  ? ?OSA on CPAP diagnosed ~15 yrs ago. This was previously followed by cardiology (last seen 2019).  ? ?Preventative: ?GYN - Dr Kenton Kingfisher at [facility] has since left the practice. All normal pap smears in the past. Overdue for f/u - last pap 2017. Will call for appt. LMP 09/02/2021 ?[facility] ?Flu shot yearly  ?[place] 07/2019, 08/2019, booster 04/2020  ?Tdap 2013, Tdap 2014  ?Prevnar-13 2015, pneumovax-23 2020 ?Seat belt use discussed.   ?Sunscreen use discussed. No changing moles on skin.  ?Ex smoker - quit 2000  ?Alcohol - none - reaction to alcohol  ?Dentist yearly - due ?Eye exam yearly  ? ?Divorced, lives with 2nd husband and 2 boys (2006, 2014) ?Occ: florist for [employer] ?Edu: some college ?Activity: walking 0.75-1 mi twice weekly - less recently ?Diet: good water, vegetables daily, stopped sweetened beverages  ?   ? ?Relevant past medical, surgical, family and social history reviewed and updated as indicated. Interim medical history since our last visit reviewed. ?Allergies and medications reviewed and updated. ?Outpatient Medications Prior to Visit  ?Medication Sig Dispense Refill  ? Cholecalciferol (VITAMIN D3) 5000 units TABS Take 5,000 Units by mouth daily.    ? co-enzyme Q-10 50 MG capsule Take 1 capsule (50 mg total) by mouth daily. 90 capsule 1  ? levocetirizine (XYZAL) 5 MG tablet     ? MISC NATURAL PRODUCTS PO Take by mouth daily. Bloom Greens powder    ? nystatin-triamcinolone ointment (MYCOLOG) Apply 1 application topically 2 (two) times daily. As needed, for max 10-14 days at a time 30 g 0  ? Probiotic Product (PROBIOTIC PO) Take by mouth daily. Seed Probiotic    ? SYNTHROID 200 MCG tablet Take 1 tablet (200 mcg total) by mouth daily before breakfast. 90 tablet 3  ? vitamin B-12 (CYANOCOBALAMIN) 100 MCG tablet Take 100 mcg by mouth daily.    ? atorvastatin (LIPITOR) 40 MG tablet Take 1  tablet (40 mg total) by mouth daily. 90 tablet 1  ? buPROPion ER (WELLBUTRIN SR) 100 MG 12 hr tablet Take 1 tablet (100 mg total) by mouth daily. 90 tablet 1  ? cyclobenzaprine (FLEXERIL) 10 MG tablet Take 0.5-1 tablets (5-10 mg total) by mouth 2 (two) times daily as needed (tension headache - with sedation precautions). 30 tablet 0  ? fluticasone (FLONASE) 50 MCG/ACT nasal spray Place 2 sprays into both nostrils daily. 48 g 3  ? metFORMIN (GLUCOPHAGE-XR) 750 MG 24 hr tablet Take 1 tablet (750 mg total) by mouth at bedtime. 90 tablet 3  ?  metoprolol succinate (TOPROL-XL) 100 MG 24 hr tablet Take 1 tablet (100 mg total) by mouth daily. Take with or immediately following a meal. 90 tablet 3  ? montelukast (SINGULAIR) 10 MG tablet Take 1 tablet (10 mg total) by mouth daily. 90 tablet 3  ? omeprazole (PRILOSEC) 40 MG capsule Take 1 capsule (40 mg total) by mouth daily as needed. 90 capsule 1  ? sertraline (ZOLOFT) 100 MG tablet Take 1 tablet (100 mg total) by mouth daily. 90 tablet 1  ? cyclobenzaprine (FLEXERIL) 10 MG tablet Take 0.5-1 tablets (5-10 mg total) by mouth 2 (two) times daily as needed (tension headache - with sedation precautions). 30 tablet 0  ? ?No facility-administered medications prior to visit.  ?  ? ?Per HPI unless specifically indicated in ROS section below ?Review of Systems  ?Constitutional:  Negative for activity change, appetite change, chills, fatigue, fever and unexpected weight change.  ?HENT:  Negative for hearing loss.   ?Eyes:  Negative for visual disturbance.  ?Respiratory:  Positive for shortness of breath. Negative for cough, chest tightness and wheezing.   ?Cardiovascular:  Positive for chest pain. Negative for palpitations and leg swelling.  ?Gastrointestinal:  Positive for abdominal distention, abdominal pain and diarrhea. Negative for blood in stool, constipation, nausea and vomiting.  ?Genitourinary:  Negative for difficulty urinating and hematuria.  ?Musculoskeletal:  Negative for arthralgias, myalgias and neck pain.  ?Skin:  Negative for rash.  ?Neurological:  Positive for headaches (occ). Negative for dizziness, seizures and syncope.  ?Hematological:  Negative for adenopathy. Does not bruise/bleed easily.  ?Psychiatric/Behavioral:  Positive for dysphoric mood. The patient is nervous/anxious.   ? ?Objective:  ?BP 124/82   Pulse 91   Temp 97.8 ?F (36.6 ?C) (Temporal)   Ht '5\' 2"'$  (1.575 m)   Wt (!) 366 lb 8 oz (166.2 kg)   LMP 09/02/2021   SpO2 96%   BMI 67.03 kg/m?   ?Wt Readings from Last 3 Encounters:   ?09/17/21 (!) 366 lb 8 oz (166.2 kg)  ?05/03/21 (!) 365 lb 6 oz (165.7 kg)  ?12/06/20 (!) 367 lb 6.4 oz (166.7 kg)  ?  ?  ?Physical Exam ?Vitals and nursing note reviewed.  ?Constitutional:   ?   Appearance: Normal appearance. She is obese. She is not ill-appearing.  ?HENT:  ?   Head: Normocephalic and atraumatic.  ?   Right Ear: Tympanic membrane, ear canal and external ear normal. There is no impacted cerumen.  ?   Left Ear: Tympanic membrane, ear canal and external ear normal. There is no impacted cerumen.  ?   Mouth/Throat:  ?   Mouth: Mucous membranes are moist.  ?   Pharynx: Oropharynx is clear. No oropharyngeal exudate or posterior oropharyngeal erythema.  ?Eyes:  ?   General:     ?   Right eye: No discharge.     ?  Left eye: No discharge.  ?   Extraocular Movements: Extraocular movements intact.  ?   Conjunctiva/sclera: Conjunctivae normal.  ?   Pupils: Pupils are equal, round, and reactive to light.  ?Neck:  ?   Thyroid: No thyroid mass or thyromegaly.  ?Cardiovascular:  ?   Rate and Rhythm: Normal rate and regular rhythm.  ?   Pulses: Normal pulses.  ?   Heart sounds: Normal heart sounds. No murmur heard. ?Pulmonary:  ?   Effort: Pulmonary effort is normal. No respiratory distress.  ?   Breath sounds: Normal breath sounds. No wheezing, rhonchi or rales.  ?Abdominal:  ?   General: Bowel sounds are normal. There is no distension.  ?   Palpations: Abdomen is soft. There is no mass.  ?   Tenderness: There is no abdominal tenderness. There is no guarding or rebound.  ?   Hernia: No hernia is present.  ?Musculoskeletal:  ?   Cervical back: Normal range of motion and neck supple. No rigidity.  ?   Right lower leg: No edema.  ?   Left lower leg: No edema.  ?Lymphadenopathy:  ?   Cervical: No cervical adenopathy.  ?Skin: ?   General: Skin is warm and dry.  ?   Findings: No rash.  ?Neurological:  ?   General: No focal deficit present.  ?   Mental Status: She is alert. Mental status is at baseline.  ?Psychiatric:      ?   Mood and Affect: Mood normal.     ?   Behavior: Behavior normal.  ? ?   ?Results for orders placed or performed in visit on 09/10/21  ?Hemoglobin A1c  ?Result Value Ref Range  ? Hgb A1c MFr Bld 6.3 4.6 - 6.5 %  ?Lipid pan

## 2021-09-17 NOTE — Assessment & Plan Note (Addendum)
Continue high dose Synthroid followed by endocrinology.  ?

## 2021-09-17 NOTE — Assessment & Plan Note (Addendum)
Continue metformin.

## 2021-09-17 NOTE — Assessment & Plan Note (Addendum)
Previously followed by cardiology.  ?Due for f/u.  ?Continues CPAP nightly  ?

## 2021-09-17 NOTE — Patient Instructions (Addendum)
We will refer you to GI.  ?EKG today  ?Pass by lab to pick up stool test. ?Cholesterol levels are great today! Change atorvastatin to rosuvastatin '10mg'$  daily. Let us know if any trouble with this.  ?You can check with GYN nearby at [facility] (center for [facility]).  ?Return as needed or in 6 months for follow up visit.  ? ?Health Maintenance, Female ?Adopting a healthy lifestyle and getting preventive care are important in promoting health and wellness. Ask your health care provider about: ?The right schedule for you to have regular tests and exams. ?Things you can do on your own to prevent diseases and keep yourself healthy. ?What should I know about diet, weight, and exercise? ?Eat a healthy diet ? ?Eat a diet that includes plenty of vegetables, fruits, low-fat dairy products, and lean protein. ?Do not eat a lot of foods that are high in solid fats, added sugars, or sodium. ?Maintain a healthy weight ?Body mass index (BMI) is used to identify weight problems. It estimates body fat based on height and weight. Your health care provider can help determine your BMI and help you achieve or maintain a healthy weight. ?Get regular exercise ?Get regular exercise. This is one of the most important things you can do for your health. Most adults should: ?Exercise for at least 150 minutes each week. The exercise should increase your heart rate and make you sweat (moderate-intensity exercise). ?Do strengthening exercises at least twice a week. This is in addition to the moderate-intensity exercise. ?Spend less time sitting. Even light physical activity can be beneficial. ?Watch cholesterol and blood lipids ?Have your blood tested for lipids and cholesterol at 43 years of age, then have this test every 5 years. ?Have your cholesterol levels checked more often if: ?Your lipid or cholesterol levels are high. ?You are older than 43 years of age. ?You are at high risk for heart disease. ?What should I know about cancer  screening? ?Depending on your health history and family history, you may need to have cancer screening at various ages. This may include screening for: ?Breast cancer. ?Cervical cancer. ?Colorectal cancer. ?Skin cancer. ?Lung cancer. ?What should I know about heart disease, diabetes, and high blood pressure? ?Blood pressure and heart disease ?High blood pressure causes heart disease and increases the risk of stroke. This is more likely to develop in people who have high blood pressure readings or are overweight. ?Have your blood pressure checked: ?Every 3-5 years if you are 50-62 years of age. ?Every year if you are 64 years old or older. ?Diabetes ?Have regular diabetes screenings. This checks your fasting blood sugar level. Have the screening done: ?Once every three years after age 6 if you are at a normal weight and have a low risk for diabetes. ?More often and at a younger age if you are overweight or have a high risk for diabetes. ?What should I know about preventing infection? ?Hepatitis B ?If you have a higher risk for hepatitis B, you should be screened for this virus. Talk with your health care provider to find out if you are at risk for hepatitis B infection. ?Hepatitis C ?Testing is recommended for: ?Everyone born from [birth year] through 1967. ?Anyone with known risk factors for hepatitis C. ?Sexually transmitted infections (STIs) ?Get screened for STIs, including gonorrhea and chlamydia, if: ?You are sexually active and are younger than 43 years of age. ?You are older than 43 years of age and your health care provider tells you that you  are at risk for this type of infection. ?Your sexual activity has changed since you were last screened, and you are at increased risk for chlamydia or gonorrhea. Ask your health care provider if you are at risk. ?Ask your health care provider about whether you are at high risk for HIV. Your health care provider may recommend a prescription medicine to help prevent HIV  infection. If you choose to take medicine to prevent HIV, you should first get tested for HIV. You should then be tested every 3 months for as long as you are taking the medicine. ?Pregnancy ?If you are about to stop having your period (premenopausal) and you may become pregnant, seek counseling before you get pregnant. ?Take 400 to 800 micrograms (mcg) of folic acid every day if you become pregnant. ?Ask for birth control (contraception) if you want to prevent pregnancy. ?Osteoporosis and menopause ?Osteoporosis is a disease in which the bones lose minerals and strength with aging. This can result in bone fractures. If you are 60 years old or older, or if you are at risk for osteoporosis and fractures, ask your health care provider if you should: ?Be screened for bone loss. ?Take a calcium or vitamin D supplement to lower your risk of fractures. ?Be given hormone replacement therapy (HRT) to treat symptoms of menopause. ?Follow these instructions at home: ?Alcohol use ?Do not drink alcohol if: ?Your health care provider tells you not to drink. ?You are pregnant, may be pregnant, or are planning to become pregnant. ?If you drink alcohol: ?Limit how much you have to: ?0-1 drink a day. ?Know how much alcohol is in your drink. In the [place] one drink equals one 12 oz bottle of beer (355 mL), one 5 oz glass of wine (148 mL), or one 1? oz glass of hard liquor (44 mL). ?Lifestyle ?Do not use any products that contain nicotine or tobacco. These products include cigarettes, chewing tobacco, and vaping devices, such as e-cigarettes. If you need help quitting, ask your health care provider. ?Do not use street drugs. ?Do not share needles. ?Ask your health care provider for help if you need support or information about quitting drugs. ?General instructions ?Schedule regular health, dental, and eye exams. ?Stay current with your vaccines. ?Tell your health care provider if: ?You often feel depressed. ?You have ever been abused  or do not feel safe at home. ?Summary ?Adopting a healthy lifestyle and getting preventive care are important in promoting health and wellness. ?Follow your health care provider's instructions about healthy diet, exercising, and getting tested or screened for diseases. ?Follow your health care provider's instructions on monitoring your cholesterol and blood pressure. ?This information is not intended to replace advice given to you by your health care provider. Make sure you discuss any questions you have with your health care provider. ?Document Revised: 10/01/2020 Document Reviewed: 10/01/2020 ?Elsevier Patient Education ? [place]. ? ?

## 2021-09-17 NOTE — Assessment & Plan Note (Addendum)
Longstanding. labwork overall stable. Will refer to GI for further evaluation of chronic epigastric discomfort. EKG stable, doesn't describe cardiac symptoms.  ?

## 2021-09-17 NOTE — Assessment & Plan Note (Signed)
H/o this.  ?LFTs normal.  ?Check ferritin.  ?

## 2021-09-17 NOTE — Assessment & Plan Note (Signed)
Continues high dose vit D 5000 IU daily.  ?

## 2021-09-17 NOTE — Assessment & Plan Note (Signed)
Encourage healthy diet and lifestyle choices to affect sustainable weight loss.  

## 2021-09-17 NOTE — Assessment & Plan Note (Signed)
Preventative protocols reviewed and updated unless pt declined. Discussed healthy diet and lifestyle.  

## 2021-09-17 NOTE — Assessment & Plan Note (Signed)
Longstanding issue, seems more than just IBS-D.  ?Finds pre/probiotic has been very helpful.  ?States she's tested positive for genes for celiac disease - will check for this.  ?Fecal elastase never resulted - will send home with new stool test.  ?

## 2021-09-17 NOTE — Assessment & Plan Note (Signed)
Deteriorated control reviewed - she will focus on lowering added sugar, sweetened beverages.  ?

## 2021-09-18 ENCOUNTER — Other Ambulatory Visit: Payer: Self-pay | Admitting: Family Medicine

## 2021-09-18 DIAGNOSIS — E611 Iron deficiency: Secondary | ICD-10-CM | POA: Insufficient documentation

## 2021-09-23 ENCOUNTER — Other Ambulatory Visit

## 2021-09-23 DIAGNOSIS — K529 Noninfective gastroenteritis and colitis, unspecified: Secondary | ICD-10-CM

## 2021-09-29 LAB — CELIAC PNL 2 RFLX ENDOMYSIAL AB TTR
(tTG) Ab, IgA: <1 U/mL
(tTG) Ab, IgG: <1 U/mL
Endomysial Ab IgA: NEGATIVE
Gliadin IgA: 1.1 U/mL
Gliadin IgG: <1 U/mL
Immunoglobulin A: 268 mg/dL (ref 47–310)

## 2021-09-30 ENCOUNTER — Encounter: Payer: Self-pay | Admitting: Family Medicine

## 2021-09-30 LAB — PANCREATIC ELASTASE, FECAL: Pancreatic Elastase-1, Stool: >500 mcg/g

## 2021-10-30 ENCOUNTER — Encounter: Payer: Self-pay | Admitting: Gastroenterology

## 2021-10-30 ENCOUNTER — Other Ambulatory Visit

## 2021-10-30 ENCOUNTER — Ambulatory Visit (INDEPENDENT_AMBULATORY_CARE_PROVIDER_SITE_OTHER): Admitting: Gastroenterology

## 2021-10-30 VITALS — BP 118/70 | HR 85 | Ht 62.0 in | Wt 363.1 lb

## 2021-10-30 DIAGNOSIS — R197 Diarrhea, unspecified: Secondary | ICD-10-CM | POA: Diagnosis not present

## 2021-10-30 DIAGNOSIS — R1310 Dysphagia, unspecified: Secondary | ICD-10-CM | POA: Diagnosis not present

## 2021-10-30 DIAGNOSIS — K219 Gastro-esophageal reflux disease without esophagitis: Secondary | ICD-10-CM

## 2021-10-30 DIAGNOSIS — R1013 Epigastric pain: Secondary | ICD-10-CM | POA: Diagnosis not present

## 2021-10-30 NOTE — Patient Instructions (Addendum)
If you are age 43 or older, your body mass index should be between 23-30. Your Body mass index is 66.42 kg/m. If this is out of the aforementioned range listed, please consider follow up with your Primary Care Provider.  If you are age 91 or younger, your body mass index should be between 19-25. Your Body mass index is 66.42 kg/m. If this is out of the aformentioned range listed, please consider follow up with your Primary Care Provider.   ________________________________________________________  The [place] [facility] providers would like to encourage you to use [facility] to communicate with providers for non-urgent requests or questions.  Due to long hold times on the telephone, sending your provider a message by [facility] may be a faster and more efficient way to get a response.  Please allow 48 business hours for a response.  Please remember that this is for non-urgent requests.  _______________________________________________________  Your provider has requested that you go to the basement level for lab work before leaving today. Press "B" on the elevator. The lab is located at the first door on the left as you exit the elevator.  Stop metformin 7-10 days and call with an update on how it has affected your bowel habits.  Continue omeprazole '20mg'$  daily  You have been scheduled for an endoscopy and colonoscopy. Please follow the written instructions given to you at your visit today. Please pick up your prep supplies at the pharmacy within the next 1-3 days. If you use inhalers (even only as needed), please bring them with you on the day of your procedure. Booking number [id number]  Please call with any questions or concerns  Thank you,  Dr. Jackquline Denmark

## 2021-10-30 NOTE — Progress Notes (Signed)
Chief Complaint: GI [facility]  Referring Provider:  Ria Bush, MD      ASSESSMENT AND PLAN;   #1. Diarrhea . Prev H/O IBS-D. Meds like metformin likely contributing.  Nl fecal elastase, neg celiac, TSH. H/O rectal bleeding is concerning.  #2. GERD with occ dysphagia (mostly to pills)  #3. Epi pain. Nl CBC, CMP, lipase.  Neg [place] 07/2019 except for fatty liver.  Plan: -Stool studies for GI Pathogen (includes C. Diff) and Calprotectin -Stop metformin x 7-10 days to see if diarrhea gets better. -Continie omeprazole '20mg'$  po QD. -EGD with dil/colon @ [facility] (BMI>50) -If still with problems, trial of dicyclomine. -If continued epi pain, [place] followed by [facility]/GES.  HPI:    Linda Welch is a 41 y.o. 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  However, last 1 year getting worse Currently 8-10 BMs/day on a bad day, mostly postprandial No nocturnal symptoms Had some rectal bleeding intermittently, attributed to hemorrhoids Nl CBC, CMP, neg celiac screen Neg fecal elastase No weight loss.  No history of dehydration.  No history of recent travel or gastroenteritis.  No recent antibiotics. Metformin does make it worse. Few other foods like iceberg lettuce, lactose, fried foods would cause more diarrhea.   Also with epi pain x 1 yr, worse with meals No N/V No hearburn Has been omeprazole which does help Has been having dysphagia mostly to pills.  No problems with food.  Quite concerned about the same. No melena.  No significant nonsteroidals.  No jaundice dark urine or pale stools.  No sodas, chocolates, chewing gums, artificial sweeteners and candy. No NSAIDs  Has been advised to get EGD/colonoscopy performed.   SH- 16/8 boys. [place] Past Medical History:  Diagnosis Date   Allergy     Anxiety    Cancer ([facility]    "thyroid cancer - thyroidectomy January 29371   Complication of anesthesia    "post operative myalgia"   Depression    Difficult intubation    "due to limited neck mobility"   GERD (gastroesophageal reflux disease)    "not taking Prilosec any longer"   Hashimoto's thyroiditis 2010   History of bronchitis    History of influenza pneumonia 2012   History of kidney stones    lithotripsy   Migraine    migraines- usually x1 weekly   OSA on CPAP    cpap nightly-settings 16   [place], [place]   PCOS (polycystic ovarian syndrome) 2003   takes Metformin for PCOS   Stress incontinence    Wears glasses     Past Surgical History:  Procedure Laterality Date   ACHILLES TENDON SURGERY N/A 01/29/2016   Procedure: RIGHT ACHILLES  RECONSTRUCTION;  Surgeon: JWylene Simmer MD;  [place]  Service: Orthopedics;  Laterality: N/A;   EXTRACORPOREAL SHOCK WAVE LITHOTRIPSY Left    GASTROCNEMIUS RECESSION N/A 01/29/2016   Procedure: GASTROC RECESSION POSSIBLE FLEXIBLE FLEXOR HALGUS LONGUS TRANSER;  Surgeon: JWylene Simmer MD;  [place]  Service: Orthopedics;  Laterality: N/A;   THYROID LOBECTOMY Right 05/16/2014   Procedure: RIGHT THYROID LOBECTOMY;  Surgeon: TArmandina Gemma MD;  [facility];  Service: General;  Laterality: Right;   THYROIDECTOMY N/A 06/01/2014   Procedure: COMPLETION THYROIDECTOMY;  Surgeon: TArmandina Gemma MD;  [facility];  Service:  General;  Laterality: N/A;   TONSILECTOMY, ADENOIDECTOMY, BILATERAL MYRINGOTOMY AND TUBES     1984 and 1989    Family History  Problem Relation Age of Onset   Thyroid disease Mother        thyroid goiter   Depression Mother    Hyperlipidemia Mother    Hypertension Mother    CAD Mother 81       triple bypass   Hyperlipidemia Father    Hypertension Father    Sleep apnea Father    CAD Father 7       MI, stent   Asthma Brother    Depression Brother    Heart disease  Maternal Grandmother    Hyperlipidemia Maternal Grandmother    Heart disease Maternal Grandfather    Hyperlipidemia Maternal Grandfather    Heart disease Paternal Grandmother    Hyperlipidemia Paternal Grandmother    Heart disease Paternal Grandfather    Hyperlipidemia Paternal Grandfather    Diabetes Paternal Grandfather    Sleep apnea Paternal Grandfather    Cancer Neg Hx    Colon cancer Neg Hx    Rectal cancer Neg Hx    Stomach cancer Neg Hx     Social History   Tobacco Use   Smoking status: Former    Packs/day: 0.25    Years: 2.00    Pack years: 0.50    Types: Cigarettes    Quit date: 05/26/1998    Years since quitting: 23.4   Smokeless tobacco: Never   Tobacco comments:    "quit smoking in 2000 - smoked for 2 years"   Vaping Use   Vaping Use: Never used  Substance Use Topics   Alcohol use: No    Alcohol/week: 0.0 standard drinks   Drug use: No    Current Outpatient Medications  Medication Sig Dispense Refill   buPROPion (WELLBUTRIN XL) 150 MG 24 hr tablet Take 1 tablet (150 mg total) by mouth daily. 90 tablet 3   Cholecalciferol (VITAMIN D3) 5000 units TABS Take 5,000 Units by mouth daily.     cyclobenzaprine (FLEXERIL) 10 MG tablet Take 0.5-1 tablets (5-10 mg total) by mouth 2 (two) times daily as needed (tension headache - with sedation precautions). 30 tablet 0   fluticasone (FLONASE) 50 MCG/ACT nasal spray Place 2 sprays into both nostrils daily. 48 g 3   levocetirizine (XYZAL) 5 MG tablet      metFORMIN (GLUCOPHAGE-XR) 750 MG 24 hr tablet Take 1 tablet (750 mg total) by mouth at bedtime. 90 tablet 3   metoprolol succinate (TOPROL-XL) 100 MG 24 hr tablet Take 1 tablet (100 mg total) by mouth daily. Take with or immediately following a meal. 90 tablet 3   montelukast (SINGULAIR) 10 MG tablet Take 1 tablet (10 mg total) by mouth daily. 90 tablet 3   omeprazole (PRILOSEC) 40 MG capsule Take 1 capsule (40 mg total) by mouth daily as needed. 30 capsule 6    rosuvastatin (CRESTOR) 10 MG tablet Take 1 tablet (10 mg total) by mouth daily. 90 tablet 3   sertraline (ZOLOFT) 100 MG tablet Take 1 tablet (100 mg total) by mouth daily. 90 tablet 3   SYNTHROID 200 MCG tablet Take 1 tablet (200 mcg total) by mouth daily before breakfast. 90 tablet 3   vitamin B-12 (CYANOCOBALAMIN) 100 MCG tablet Take 100 mcg by mouth daily.     Probiotic Product (PROBIOTIC PO) Take by mouth daily. Seed Probiotic (Patient not taking: Reported on 10/30/2021)     No  current facility-administered medications for this visit.    Allergies  Allergen Reactions   Tioconazole Itching, Swelling and Other (See Comments)    Burning (severe)   Tape Hives and Swelling    Adhesive tapes   Buspar [Buspirone]     Worsening mood    Review of Systems:  Constitutional: Denies fever, chills, diaphoresis, appetite change and fatigue.  HEENT: Denies photophobia, eye pain, redness, hearing loss, ear pain, congestion, sore throat, rhinorrhea, sneezing, mouth sores, neck pain, neck stiffness and tinnitus.   Respiratory: Denies SOB, DOE, cough, chest tightness,  and wheezing.   Cardiovascular: Denies chest pain, palpitations and leg swelling.  Genitourinary: Denies dysuria, urgency, frequency, hematuria, flank pain and difficulty urinating.  Musculoskeletal: Denies myalgias, back pain, joint swelling, arthralgias and gait problem.  Skin: No rash.  Neurological: Denies dizziness, seizures, syncope, weakness, light-headedness, numbness and headaches.  Hematological: Denies adenopathy. Easy bruising, personal or family bleeding history  Psychiatric/Behavioral: Has anxiety or depression     Physical Exam:    BP 118/70   Pulse 85   Ht '5\' 2"'$  (1.575 m)   Wt (!) 363 lb 2 oz (164.7 kg)   SpO2 98%   BMI 66.42 kg/m  Wt Readings from Last 3 Encounters:  10/30/21 (!) 363 lb 2 oz (164.7 kg)  09/17/21 (!) 366 lb 8 oz (166.2 kg)  05/03/21 (!) 365 lb 6 oz (165.7 kg)   Constitutional:   Well-developed, in no acute distress. Psychiatric: Normal mood and affect. Behavior is normal. HEENT: Pupils normal.  Conjunctivae are normal. No scleral icterus. Neck supple.  Cardiovascular: Normal rate, regular rhythm. No edema Pulmonary/chest: Effort normal and breath sounds normal. No wheezing, rales or rhonchi. Abdominal: Soft, nondistended. Epi tenderness. bowel sounds active throughout. There are no masses palpable. No hepatomegaly. Rectal: Deferred Neurological: Alert and oriented to person place and time. Skin: Skin is warm and dry. No rashes noted.  Data Reviewed: I have personally reviewed following labs and imaging studies  CBC:    Latest Ref Rng & Units 09/10/2021    9:00 AM 05/03/2021   10:17 AM 04/26/2019    5:08 PM  CBC  WBC 4.0 - 10.5 K/uL 7.3   8.8   9.9    Hemoglobin 12.0 - 15.0 g/dL 12.5   13.3   13.8    Hematocrit 36.0 - 46.0 % 38.3   40.5   41.3    Platelets 150.0 - 400.0 K/uL 257.0   257.0   262.0      CMP:    Latest Ref Rng & Units 09/10/2021    9:00 AM 05/03/2021   10:17 AM 07/25/2020    9:08 AM  CMP  Glucose 70 - 99 mg/dL 127   103   96    BUN 6 - 23 mg/dL '9   10   11    '$ Creatinine 0.40 - 1.20 mg/dL 0.56   0.62   0.65    Sodium 135 - 145 mEq/L 138   137   136    Potassium 3.5 - 5.1 mEq/L 3.9   4.1   4.4    Chloride 96 - 112 mEq/L 101   104   101    CO2 19 - 32 mEq/L '27   26   30    '$ Calcium 8.4 - 10.5 mg/dL 8.3   8.8   8.6    Total Protein 6.0 - 8.3 g/dL 7.3   7.5   7.3    Total Bilirubin 0.2 -  1.2 mg/dL 0.4   0.4   0.4    Alkaline Phos 39 - 117 U/L 96   95   86    AST 0 - 37 U/L '27   18   21    '$ ALT 0 - 35 U/L '27   18   20          '$ Carmell Austria, MD 10/30/2021, 11:32 AM  Cc: Ria Bush, MD

## 2021-12-05 ENCOUNTER — Encounter: Payer: Self-pay | Admitting: Internal Medicine

## 2021-12-05 ENCOUNTER — Ambulatory Visit (INDEPENDENT_AMBULATORY_CARE_PROVIDER_SITE_OTHER): Admitting: Internal Medicine

## 2021-12-05 VITALS — BP 120/82 | HR 80 | Ht 62.0 in | Wt 358.6 lb

## 2021-12-05 DIAGNOSIS — E890 Postprocedural hypothyroidism: Secondary | ICD-10-CM

## 2021-12-05 DIAGNOSIS — C73 Malignant neoplasm of thyroid gland: Secondary | ICD-10-CM | POA: Diagnosis not present

## 2021-12-05 LAB — TSH: TSH: 1.46 u[IU]/mL (ref 0.35–5.50)

## 2021-12-05 LAB — T4, FREE: Free T4: 1 ng/dL (ref 0.60–1.60)

## 2021-12-05 NOTE — Patient Instructions (Signed)
Please stop at the lab.  Please continue Synthroid 200  mcg daily.  .Take the thyroid hormone every day, with water, at least 30 minutes before breakfast, separated by at least 4 hours from: - acid reflux medications - calcium - iron - multivitamins  Please come back for a follow-up appointment in 1 year.

## 2021-12-05 NOTE — Progress Notes (Signed)
Patient ID: Linda Welch, female   DOB: 1981-05-19, 41 y.o.   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  Last visit 1 year ago.  Interim hx: She continues to have a lot of stress at home.  He is a caregiver for her husband who has diabetes and is on dialysis.  He recently fell in dialysis and fractured his neck.  He is not home from the hospital yet.  He may not walk again. She has diarrhea -longstanding. She saw GI >> stopped Metformin >> diarrhea improved, but did not stop. Now back on Metformin. EGD and colonoscopy pending this summer. She has palpitations (on Metoprolol), no tremors, no weight gain or loss, + heat intolerance. She has hoarseness.  Reviewed and addended her thyroid cancer history: - dx in in 02/2009 >> 3 cm nodule felt by pt >> seen by PCP >> referred to endo >> uptake and scan >> cold nodule >> FNA by Dr Francoise Schaumann: benign (bad experience).  - thyroid U/S 2010: 3.1 x 2.6 x 2.2 cm complex, slightly hypoechoic, nodule, situated in the mid pole of the right thyroid lobe - Thyroid ultrasound 02/06/2014: Large nodule occupying most of the right lobe measures 5.2 x 3.2 x 4.1 cm. There are some internal calcifications. It previously measured 3.1 x 2.6 x 2.2 cm based on the prior report. - FNA 47/82/9562: FOLLICULAR NEOPLASM AND/OR LESION ([facility])  - Right lobectomy 05/16/2014 and completion thyroidectomy 06/01/2014 Thyroid, lobectomy, right - FOLLICULAR VARIANT OF PAPILLARY THYROID CARCINOMA WITH CAPSULAR INVASION, 4.2 CM, CONFINED WITHIN THYROIDAL TISSUE. - NO EVIDENCE OF ANGIOLYMPHATIC INVASION IDENTIFIED. - RESECTION MARGINS, NEGATIVE FOR ATYPIA OR MALIGNANCY. Microscopic Comment THYROID Specimen: Right thyroid Procedure: Right hemithyroidectomy Specimen Integrity (intact/fragmented): Intact Tumor focality: Unifocal Maximum tumor size  (cm): 4.2 cm, gross description Tumor laterality: Right thyroid gland Histologic type (including subtype and/or unique features as applicable): Follicular variant of papillary thyroid carcinoma Tumor capsule: Present Extrathyroidal extension: No Margins: Negative Lymph - Vascular invasion: Not identified Capsular invasion with degree of invasion if present: Present, complete Lymph nodes: # examined N/A; # positive; N/A TNM code: pT3 , pNX Non-neoplastic thyroid: Unremarkable. Due to the low risk of her thyroid tumor, I did not recommend I-131 remnant ablation.  01/07/2016: Neck ultrasound: Surgical changes of total thyroidectomy without evidence of residual thyroid tissue, nodularity or lymphadenopathy.  Neck U/S (12/14/2019): Isthmus: Surgically absent. There is no residual nodular soft tissue within the isthmic resection bed.   Right lobe: Surgically absent. There is no residual nodular soft tissue within the right lobectomy resection bed.   Left lobe: Surgically absent. There is no residual nodular soft tissue within the left lobectomy resection bed.  _________________________________________________________   No regional cervical lymphadenopathy.   IMPRESSION: Post total thyroidectomy without evidence of residual or locally recurrent disease.  Her thyroglobulin levels are fluctuating: Lab Results  Component Value Date   THYROGLB 0.3 (L) 12/06/2020   THYROGLB 0.4 (L) 12/02/2019   THYROGLB 0.2 (L) 06/22/2017   THYROGLB 0.3 (L) 12/22/2016   THYROGLB 0.6 (L) 12/24/2015   THGAB <1 12/06/2020   THGAB <1 12/02/2019   THGAB <1 06/22/2017   THGAB <1 12/22/2016   THGAB <1 12/24/2015   Pt denies: - feeling nodules in neck - hoarseness - dysphagia - choking  Hypothyroidism: Pt. has been dx with hypothyroidism in 2010, now postoperative hypothyroidism; she is on brand-name Synthroid.  Pt is on Synthroid 200, increased from 175 mcg daily at last visit, taken: - in am -  fasting - at least 30 min from b'fast - no Ca, Fe, MVI, + PPIs at night  - not on Biotin  Reviewed her TFTs: Lab Results  Component Value Date   TSH 3.11 12/06/2020   TSH 6.06 (H) 07/25/2020   TSH 5.52 (H) 12/02/2019   TSH 1.37 06/21/2018   TSH 4.45 01/11/2018   FREET4 0.92 12/06/2020   FREET4 1.02 07/25/2020   FREET4 1.02 12/02/2019   FREET4 1.26 06/21/2018   FREET4 0.75 01/11/2018  03/29/2013: TSH 0.93, fT4 1.03 02/04/2013 (towards end of pregnancy): TSH 0.12, fT4 0.96 >> Synthroid reduced from 100 to 88 mcg 12/2012: TSH 0.07 >> Synthroid reduced from 112 to 100 mcg  Vitamin D insufficiency   She continues on 5000 units vitamin D daily.  Reviewed vitamin D levels: Lab Results  Component Value Date   VD25OH 34.98 09/10/2021   VD25OH 31.21 07/25/2020   VD25OH 31.0 12/02/2019   VD25OH 32.28 04/26/2019   VD25OH 32.43 01/11/2018   VD25OH 29.92 (L) 06/22/2017   VD25OH 34.67 12/22/2016   VD25OH 23.51 (L) 06/23/2016   VD25OH 9.6 (L) 11/05/2015   In the past, patient complained of palpitations >> started fall 2017 after we last increased her LT4, but exacerbated 06/2016.  Of note, she had full cardiac investigation including a Holter monitor >> PVC and PACs (217 events per 48h).  On metoprolol.  She also has PCOS.  Reviewed latest HbA1c level: Lab Results  Component Value Date   HGBA1C 6.3 09/10/2021   ROS: + see HPI  Past Medical History:  Diagnosis Date   Allergy    Anxiety    Cancer [facility])    "thyroid cancer - thyroidectomy January 3354"   Complication of anesthesia    "post operative myalgia"   Depression    Difficult intubation    "due to limited neck mobility"   GERD (gastroesophageal reflux disease)    "not taking Prilosec any longer"   Hashimoto's thyroiditis 2010   History of bronchitis    History of influenza pneumonia 2012   History of kidney stones    lithotripsy   Migraine    migraines- usually x1 weekly   OSA on CPAP    cpap nightly-settings  16   Papillary carcinoma, follicular variant ([place]) 05/31/2014   PCOS (polycystic ovarian syndrome) 2003   takes Metformin for PCOS   Stress incontinence    Wears glasses    Past Surgical History:  Procedure Laterality Date   ACHILLES TENDON SURGERY N/A 01/29/2016   Procedure: RIGHT ACHILLES  RECONSTRUCTION;  Surgeon: Wylene Simmer, MD;  Location: [place];  Service: Orthopedics;  Laterality: N/A;   EXTRACORPOREAL SHOCK WAVE LITHOTRIPSY Left    GASTROCNEMIUS RECESSION N/A 01/29/2016   Procedure: GASTROC RECESSION POSSIBLE FLEXIBLE FLEXOR HALGUS LONGUS TRANSER;  Surgeon: Wylene Simmer, MD;  Location: [place];  Service: Orthopedics;  Laterality: N/A;   THYROID LOBECTOMY Right 05/16/2014   Procedure: RIGHT THYROID LOBECTOMY;  Surgeon: Armandina Gemma, MD;  [facility];  Service: General;  Laterality: Right;   THYROIDECTOMY N/A 06/01/2014   Procedure: COMPLETION THYROIDECTOMY;  Surgeon: Armandina Gemma, MD;  [facility];  Service: General;  Laterality: N/A;   TONSILECTOMY, ADENOIDECTOMY, BILATERAL MYRINGOTOMY AND TUBES     1984 and 1989   Social History   Socioeconomic History   Marital status: Married    Spouse name: Not on file  Number of children: 2   Years of education: Not on file   Highest education level: Not on file  Occupational History   Not on file  Tobacco Use   Smoking status: Former    Packs/day: 0.25    Years: 2.00    Total pack years: 0.50    Types: Cigarettes    Quit date: 05/26/1998    Years since quitting: 23.5   Smokeless tobacco: Never   Tobacco comments:    "quit smoking in 2000 - smoked for 2 years"   Vaping Use   Vaping Use: Never used  Substance and Sexual Activity   Alcohol use: No    Alcohol/week: 0.0 standard drinks of alcohol   Drug use: No   Sexual activity: Yes  Other Topics Concern   Not on file  Social History Narrative   Divorced    Lives with 2nd husband and 2 boys (2006, 2014)   Occ: Psychiatric nurse for [employer]   Edu: some college   Social  Determinants of Radio broadcast assistant Strain: Not on Art therapist Insecurity: Not on file  Transportation Needs: Not on file  Physical Activity: Not on file  Stress: Not on file  Social Connections: Not on file  Intimate Partner Violence: Not on file   Current Outpatient Medications on File Prior to Visit  Medication Sig Dispense Refill   buPROPion (WELLBUTRIN XL) 150 MG 24 hr tablet Take 1 tablet (150 mg total) by mouth daily. 90 tablet 3   Cholecalciferol (VITAMIN D3) 5000 units TABS Take 5,000 Units by mouth daily.     cyclobenzaprine (FLEXERIL) 10 MG tablet Take 0.5-1 tablets (5-10 mg total) by mouth 2 (two) times daily as needed (tension headache - with sedation precautions). 30 tablet 0   fluticasone (FLONASE) 50 MCG/ACT nasal spray Place 2 sprays into both nostrils daily. 48 g 3   levocetirizine (XYZAL) 5 MG tablet      metFORMIN (GLUCOPHAGE-XR) 750 MG 24 hr tablet Take 1 tablet (750 mg total) by mouth at bedtime. 90 tablet 3   metoprolol succinate (TOPROL-XL) 100 MG 24 hr tablet Take 1 tablet (100 mg total) by mouth daily. Take with or immediately following a meal. 90 tablet 3   montelukast (SINGULAIR) 10 MG tablet Take 1 tablet (10 mg total) by mouth daily. 90 tablet 3   omeprazole (PRILOSEC) 40 MG capsule Take 1 capsule (40 mg total) by mouth daily as needed. 30 capsule 6   Probiotic Product (PROBIOTIC PO) Take by mouth daily. Seed Probiotic (Patient not taking: Reported on 10/30/2021)     rosuvastatin (CRESTOR) 10 MG tablet Take 1 tablet (10 mg total) by mouth daily. 90 tablet 3   sertraline (ZOLOFT) 100 MG tablet Take 1 tablet (100 mg total) by mouth daily. 90 tablet 3   SYNTHROID 200 MCG tablet Take 1 tablet (200 mcg total) by mouth daily before breakfast. 90 tablet 3   vitamin B-12 (CYANOCOBALAMIN) 100 MCG tablet Take 100 mcg by mouth daily.     No current facility-administered medications on file prior to visit.   Allergies  Allergen Reactions   Tioconazole Itching,  Swelling and Other (See Comments)    Burning (severe)   Tape Hives and Swelling    Adhesive tapes   Buspar [Buspirone]     Worsening mood   Family History  Problem Relation Age of Onset   Thyroid disease Mother        thyroid goiter   Depression Mother  Hyperlipidemia Mother    Hypertension Mother    CAD Mother 58       triple bypass   Hyperlipidemia Father    Hypertension Father    Sleep apnea Father    CAD Father 42       MI, stent   Asthma Brother    Depression Brother    Heart disease Maternal Grandmother    Hyperlipidemia Maternal Grandmother    Heart disease Maternal Grandfather    Hyperlipidemia Maternal Grandfather    Heart disease Paternal Grandmother    Hyperlipidemia Paternal Grandmother    Heart disease Paternal Grandfather    Hyperlipidemia Paternal Grandfather    Diabetes Paternal Grandfather    Sleep apnea Paternal Grandfather    Cancer Neg Hx    Colon cancer Neg Hx    Rectal cancer Neg Hx    Stomach cancer Neg Hx    PE: BP 120/82 (BP Location: Right Wrist, Patient Position: Sitting, Cuff Size: Normal)   Pulse 80   Ht _0  (1.575 m)   Wt (!) 358 lb 9.6 oz (162.7 kg)   SpO2 97%   BMI 65.59 kg/m  Wt Readings from Last 3 Encounters:  10/30/21 (!) 363 lb 2 oz (164.7 kg)  09/17/21 (!) 366 lb 8 oz (166.2 kg)  05/03/21 (!) 365 lb 6 oz (165.7 kg)   Constitutional: overweight, in NAD Eyes: PERRLA, EOMI, no exophthalmos ENT: moist mucous membranes, no neck masses palpated, no cervical lymphadenopathy Cardiovascular: RRR, No MRG Respiratory: CTA B Musculoskeletal: no deformities Skin: moist, warm, no rashes Neurological: no tremor with outstretched hands  ASSESSMENT: 1. Follicular variant of PTC  2. Postsurgical hypothyroidism  3. Vitamin D deficiency  PLAN:  1. PTC - Patient with follicular variant of papillary thyroid cancer, the least aggressive type of cancer.  This was encapsulated and it did not spread outside the thyroid.  She is  stage I TNM, with very good prognosis. -She denies neck compression symptoms -Reviewed the report of the latest neck ultrasound obtained in 2021 and this did not show any recurrences or suspicious masses in her neck -At last visit, thyroglobulin was still detectable, at 0.3, antithyroglobulin antibodies are undetectable.  We discussed that in the absence of RAI treatment, thyroglobulin levels can remain detectable, and further investigation is only needed when trending up. -At today's visit, we will recheck her ATA and thyroglobulin levels - she has hoarseness, but this is most likely related to acid reflux.  She has an EGD coming up later this summer.  We discussed that if no significant pathology is found, we may need to check a neck ultrasound sooner.  I do plan to check 1 at next visit, anyway. -I we will see her back in 1 year  2. Hypothyroidism - latest thyroid labs reviewed with pt. >> normal, but higher than our target due to the history of thyroid cancer: Lab Results  Component Value Date   TSH 3.11 12/06/2020  - she continues on LT4 200 mcg daily, increased after the above results returned.  Unfortunately, she did not return for labs afterwards.  We discussed that whenever we change the dose of Synthroid, she needs to come back for labs in 5 to 6 weeks. - pt feels good on this dose but continues to have fatigue, attributed to chronic stress. - we discussed about taking the thyroid hormone every day, with water, >30 minutes before breakfast, separated by >4 hours from acid reflux medications, calcium, iron, multivitamins. Pt. is taking  it correctly. - will check thyroid tests today: TSH and fT4 - If labs are abnormal, she will need to return for repeat TFTs in 1.5 months  3. Vitamin D deficiency -This is now managed by PCP -She continues on 5000 units vitamin D daily -Latest level was reviewed from 08/2021 and this was normal.  Needs refills.  Component     Latest Ref Rng  12/05/2021  TSH     0.35 - 5.50 uIU/mL 1.46   T4,Free(Direct)     0.60 - 1.60 ng/dL 1.00   Thyroglobulin     ng/mL 0.2 (L)   Thyroglobulin Ab     < or = 1 IU/mL <1   TFTs are at goal. Thyroglobulin level is slightly lower.   Philemon Kingdom, MD PhD [facility] Endocr[facility]ology

## 2021-12-06 ENCOUNTER — Other Ambulatory Visit: Payer: Self-pay | Admitting: Internal Medicine

## 2021-12-06 MED ORDER — SYNTHROID 200 MCG PO TABS
200.0000 ug | ORAL_TABLET | Freq: Every day | ORAL | 3 refills | Status: DC
Start: 2021-12-06 — End: 2022-11-26

## 2021-12-09 LAB — THYROGLOBULIN LEVEL: Thyroglobulin: 0.2 ng/mL — ABNORMAL LOW

## 2021-12-09 LAB — THYROGLOBULIN ANTIBODY: Thyroglobulin Ab: <1 IU/mL (ref <=1)

## 2022-01-16 ENCOUNTER — Encounter (HOSPITAL_COMMUNITY): Payer: Self-pay | Admitting: Gastroenterology

## 2022-01-16 NOTE — Progress Notes (Signed)
Attempted to obtain medical history via telephone, unable to reach at this time. HIPAA compliant voicemail message left requesting return call to pre surgical testing department. 

## 2022-01-22 ENCOUNTER — Encounter (HOSPITAL_COMMUNITY): Payer: Self-pay | Admitting: Anesthesiology

## 2022-01-22 ENCOUNTER — Telehealth: Payer: Self-pay | Admitting: Internal Medicine

## 2022-01-22 ENCOUNTER — Encounter: Payer: Self-pay | Admitting: Gastroenterology

## 2022-01-22 NOTE — Anesthesia Preprocedure Evaluation (Deleted)
Anesthesia Evaluation    Reviewed: Allergy & Precautions, NPO status , Patient's Chart, lab work & pertinent test results  History of Anesthesia Complications (+) DIFFICULT AIRWAY and history of anesthetic complications  Airway Mallampati: III       Dental no notable dental hx.    Pulmonary sleep apnea and Continuous Positive Airway Pressure Ventilation , former smoker,    Pulmonary exam normal        Cardiovascular negative cardio ROS Normal cardiovascular exam     Neuro/Psych  Headaches, PSYCHIATRIC DISORDERS Anxiety Depression    GI/Hepatic Neg liver ROS,   Endo/Other  Hypothyroidism Morbid obesity  Renal/GU negative Renal ROS  negative genitourinary   Musculoskeletal negative musculoskeletal ROS (+)   Abdominal   Peds  Hematology   Anesthesia Other Findings   Reproductive/Obstetrics                           Anesthesia Physical Anesthesia Plan  ASA: 3  Anesthesia Plan: MAC   Post-op Pain Management:    Induction: Intravenous  PONV Risk Score and Plan: Propofol infusion, TIVA and Treatment may vary due to age or medical condition  Airway Management Planned: Natural Airway and Mask  Additional Equipment: None  Intra-op Plan:   Post-operative Plan:   Informed Consent:   Plan Discussed with:   Anesthesia Plan Comments:       Anesthesia Quick Evaluation

## 2022-01-22 NOTE — Telephone Encounter (Signed)
Received page to the [facility] pager. She is scheduled to have an EGD and colonoscopy at [facility] tomorrow. Patient took Dulcolax earlier today and vomited afterwards. Then she proceeded with the 1st half of the Miralax prep and vomited that up too. She states that she may have caught a GI illness from her son who had N&V earlier this week. Also has had a headache. I offered to send her Zofran and to give her instructions to take additional Miralax prep. However, the patient does not think that she would be able to tolerate doing more prep and would prefer to reschedule her procedures. Will CC Dr. Lyndel Safe to this telephone note.

## 2022-01-23 ENCOUNTER — Encounter (HOSPITAL_COMMUNITY): Admission: RE | Payer: Self-pay | Source: Home / Self Care

## 2022-01-23 ENCOUNTER — Ambulatory Visit (HOSPITAL_COMMUNITY): Admission: RE | Admit: 2022-01-23 | Source: Home / Self Care | Admitting: Gastroenterology

## 2022-01-23 SURGERY — ESOPHAGOGASTRODUODENOSCOPY (EGD) WITH PROPOFOL
Anesthesia: Monitor Anesthesia Care

## 2022-01-24 NOTE — Telephone Encounter (Signed)
Left message for pt to call back  °

## 2022-01-24 NOTE — Telephone Encounter (Signed)
Thanks a lot for letting me know. RG  Linda Welch, Please reschedule at [place]

## 2022-01-24 NOTE — Telephone Encounter (Signed)
Spoke with Linda Welch. Linda Welch was notified that we have placed her on Dr. Lyda Jester wait list: Linda Welch was notified that we will reach out to Linda Welch when Dr. Lyndel Safe has availability: Linda Welch verbalized understanding with all questions answered.

## 2022-02-02 ENCOUNTER — Telehealth: Admitting: Nurse Practitioner

## 2022-02-02 DIAGNOSIS — U071 COVID-19: Secondary | ICD-10-CM | POA: Diagnosis not present

## 2022-02-02 MED ORDER — NIRMATRELVIR/RITONAVIR (PAXLOVID) TABLET (RENAL DOSING): 2.0000 | ORAL_TABLET | Freq: Two times a day (BID) | ORAL | 0 refills | Status: AC

## 2022-02-02 MED ORDER — PROMETHAZINE-DM 6.25-15 MG/5ML PO SYRP: 5.0000 mL | ORAL_SOLUTION | Freq: Four times a day (QID) | ORAL | 0 refills | Status: DC | PRN

## 2022-02-02 NOTE — Patient Instructions (Signed)
Chinita Pester [place], thank you for joining Tish Men, NP for today's virtual visit.  While this provider is not your primary care provider (PCP), if your PCP is located in our provider database this encounter information will be shared with them immediately following your visit.  Consent: (Patient) Linda Welch provided verbal consent for this virtual visit at the beginning of the encounter.  Current Medications:  Current Outpatient Medications:    nirmatrelvir/ritonavir EUA, renal dosing, (PAXLOVID) 10 x 150 MG & 10 x '100MG'$  TABS, Take 2 tablets by mouth 2 (two) times daily for 5 days. (Take nirmatrelvir 150 mg one tablet twice daily for 5 days and ritonavir 100 mg one tablet twice daily for 5 days) Patient GFR is 0.56, Disp: 20 tablet, Rfl: 0   promethazine-dextromethorphan (PROMETHAZINE-DM) 6.25-15 MG/5ML syrup, Take 5 mLs by mouth 4 (four) times daily as needed for cough., Disp: 140 mL, Rfl: 0   buPROPion (WELLBUTRIN XL) 150 MG 24 hr tablet, Take 1 tablet (150 mg total) by mouth daily., Disp: 90 tablet, Rfl: 3   Cholecalciferol (VITAMIN D3) 5000 units TABS, Take 5,000 Units by mouth daily., Disp: , Rfl:    cyclobenzaprine (FLEXERIL) 10 MG tablet, Take 0.5-1 tablets (5-10 mg total) by mouth 2 (two) times daily as needed (tension headache - with sedation precautions)., Disp: 30 tablet, Rfl: 0   fluticasone (FLONASE) 50 MCG/ACT nasal spray, Place 2 sprays into both nostrils daily. (Patient taking differently: Place 2 sprays into both nostrils daily as needed for allergies.), Disp: 48 g, Rfl: 3   levocetirizine (XYZAL) 5 MG tablet, Take 5 mg by mouth every evening., Disp: , Rfl:    metFORMIN (GLUCOPHAGE-XR) 750 MG 24 hr tablet, Take 1 tablet (750 mg total) by mouth at bedtime., Disp: 90 tablet, Rfl: 3   metoprolol succinate (TOPROL-XL) 100 MG 24 hr tablet, Take 1 tablet (100 mg total) by mouth daily. Take with or immediately following a meal., Disp: 90 tablet, Rfl: 3   montelukast  (SINGULAIR) 10 MG tablet, Take 1 tablet (10 mg total) by mouth daily., Disp: 90 tablet, Rfl: 3   olopatadine (PATADAY) 0.1 % ophthalmic solution, Place 1 drop into both eyes daily., Disp: , Rfl:    omeprazole (PRILOSEC) 40 MG capsule, Take 1 capsule (40 mg total) by mouth daily as needed. (Patient taking differently: Take 40 mg by mouth daily.), Disp: 30 capsule, Rfl: 6   Probiotic Product (PROBIOTIC PO), Take 1 tablet by mouth daily. Seed Probiotic, Disp: , Rfl:    rosuvastatin (CRESTOR) 10 MG tablet, Take 1 tablet (10 mg total) by mouth daily., Disp: 90 tablet, Rfl: 3   sertraline (ZOLOFT) 100 MG tablet, Take 1 tablet (100 mg total) by mouth daily., Disp: 90 tablet, Rfl: 3   SYNTHROID 200 MCG tablet, Take 1 tablet (200 mcg total) by mouth daily before breakfast., Disp: 90 tablet, Rfl: 3   Medications ordered in this encounter:  Meds ordered this encounter  Medications   nirmatrelvir/ritonavir EUA, renal dosing, (PAXLOVID) 10 x 150 MG & 10 x '100MG'$  TABS    Sig: Take 2 tablets by mouth 2 (two) times daily for 5 days. (Take nirmatrelvir 150 mg one tablet twice daily for 5 days and ritonavir 100 mg one tablet twice daily for 5 days) Patient GFR is 0.56    Dispense:  20 tablet    Refill:  0   promethazine-dextromethorphan (PROMETHAZINE-DM) 6.25-15 MG/5ML syrup    Sig: Take 5 mLs by mouth 4 (four) times daily as  needed for cough.    Dispense:  140 mL    Refill:  0     *If you need refills on other medications prior to your next appointment, please contact your pharmacy*  Follow-Up: Call back or seek an in-person evaluation if the symptoms worsen or if the condition fails to improve as anticipated.  Other Instructions Take medication as prescribed. Increase fluids and allow for plenty of rest. Recommend isolation for the next 5 days, complete Paxlovid. May take Ibuprofen or Tylenol as needed for pain or discomfort. Begin using Flonase for nasal congestion. Go to the ER if you develop shortness  of breath, difficulty breathing, trouble breathing or other concerns.    If you have been instructed to have an in-person evaluation today at a local Urgent Care facility, please use the link below. It will take you to a list of all of our available [facility], including address, phone number and hours of operation. Please do not delay care.  [facility]  If you or a family member do not have a primary care provider, use the link below to schedule a visit and establish care. When you choose a [facility] primary care physician or advanced practice provider, you gain a long-term partner in health. Find a Primary Care Provider  Learn more about [facility]'s in-office and virtual care options: [facility]

## 2022-02-02 NOTE — Progress Notes (Signed)
Virtual Visit Consent   Linda Welch, you are scheduled for a virtual visit with a [facility] provider today. Just as with appointments in the office, your consent must be obtained to participate. Your consent will be active for this visit and any virtual visit you may have with one of our providers in the next 365 days. If you have a MyChart account, a copy of this consent can be sent to you electronically.  As this is a virtual visit, video technology does not allow for your provider to perform a traditional examination. This may limit your provider's ability to fully assess your condition. If your provider identifies any concerns that need to be evaluated in person or the need to arrange testing (such as labs, EKG, etc.), we will make arrangements to do so. Although advances in technology are sophisticated, we cannot ensure that it will always work on either your end or our end. If the connection with a video visit is poor, the visit may have to be switched to a telephone visit. With either a video or telephone visit, we are not always able to ensure that we have a secure connection.  By engaging in this virtual visit, you consent to the provision of healthcare and authorize for your insurance to be billed (if applicable) for the services provided during this visit. Depending on your insurance coverage, you may receive a charge related to this service.  I need to obtain your verbal consent now. Are you willing to proceed with your visit today? Linda Welch has provided verbal consent on 02/02/2022 for a virtual visit (video or telephone). Linda Men, NP  Date: 02/02/2022 2:46 PM  Virtual Visit via Video Note   I, Linda Welch, connected with  Linda Welch  ([id number], 08-25-1980) on 02/02/22 at  2:45 PM EDT by a video-enabled telemedicine application and verified that I am speaking with the correct person using two identifiers.  Location: Patient: Virtual Visit  Location Patient: Home Provider: Virtual Visit Location Provider: Home   I discussed the limitations of evaluation and management by telemedicine and the availability of in person appointments. The patient expressed understanding and agreed to proceed.    History of Present Illness: Linda Welch is a 41 y.o. who identifies as a female who was assigned female at birth, and is being seen today for positive home COVID test. Symptoms started one day ago. She is experiencing fever, HA, nasal congestion, bodyaches, cough and fatigue. She has a low-grade temperature. Denies SOB, difficulty breathing or trouble breathing or GI symptoms. She reports she was at the [place] [facility] x 1 week, she was in the ER on 9/8 with a positive D-Dimer. She reports she has received 4 COVID vaccines. She has not been taking any medications for her symptoms.   HPI: HPI  Problems:  Patient Active Problem List   Diagnosis Date Noted   Iron deficiency 09/18/2021   Chronic diarrhea 05/03/2021   Family history of premature CAD 08/01/2020   Fatty liver disease, nonalcoholic 40/98/1191   Chronic epigastric pain 08/08/2019   Jaw pain 10/08/2018   Prediabetes 04/07/2018   Stress due to illness of family member 04/07/2018   TTH (tension-type headache) 07/13/2017   Encounter for general adult medical examination with abnormal findings 01/06/2017   Hyperlipidemia 01/06/2017   Vitamin D deficiency 11/06/2015   BMI 60.0-69.9, adult ([place]) 11/05/2015   Chronic allergic rhinitis 11/05/2015   Postsurgical hypothyroidism 06/25/2015   History of  gestational diabetes 04/23/2015   Dysfunction of eustachian tube 11/06/2014   H/O thyroidectomy 11/06/2014   Hematuria, microscopic 11/06/2014   OSA on CPAP 11/06/2014   Posterior left knee pain 11/06/2014   PCOS (polycystic ovarian syndrome) 07/25/2013   MDD (major depressive disorder), recurrent episode, moderate ([facility]) 03/03/2013   Hemochromatosis carrier 08/21/2012    Migraine 02/18/2010   Dyssomnia 03/10/2008    Allergies:  Allergies  Allergen Reactions   Tioconazole Itching, Swelling and Other (See Comments)    Burning (severe)   Tape Hives and Swelling    Adhesive tapes   Buspar [Buspirone]     Worsening mood   Medications:  Current Outpatient Medications:    buPROPion (WELLBUTRIN XL) 150 MG 24 hr tablet, Take 1 tablet (150 mg total) by mouth daily., Disp: 90 tablet, Rfl: 3   Cholecalciferol (VITAMIN D3) 5000 units TABS, Take 5,000 Units by mouth daily., Disp: , Rfl:    cyclobenzaprine (FLEXERIL) 10 MG tablet, Take 0.5-1 tablets (5-10 mg total) by mouth 2 (two) times daily as needed (tension headache - with sedation precautions)., Disp: 30 tablet, Rfl: 0   fluticasone (FLONASE) 50 MCG/ACT nasal spray, Place 2 sprays into both nostrils daily. (Patient taking differently: Place 2 sprays into both nostrils daily as needed for allergies.), Disp: 48 g, Rfl: 3   levocetirizine (XYZAL) 5 MG tablet, Take 5 mg by mouth every evening., Disp: , Rfl:    metFORMIN (GLUCOPHAGE-XR) 750 MG 24 hr tablet, Take 1 tablet (750 mg total) by mouth at bedtime., Disp: 90 tablet, Rfl: 3   metoprolol succinate (TOPROL-XL) 100 MG 24 hr tablet, Take 1 tablet (100 mg total) by mouth daily. Take with or immediately following a meal., Disp: 90 tablet, Rfl: 3   montelukast (SINGULAIR) 10 MG tablet, Take 1 tablet (10 mg total) by mouth daily., Disp: 90 tablet, Rfl: 3   olopatadine (PATADAY) 0.1 % ophthalmic solution, Place 1 drop into both eyes daily., Disp: , Rfl:    omeprazole (PRILOSEC) 40 MG capsule, Take 1 capsule (40 mg total) by mouth daily as needed. (Patient taking differently: Take 40 mg by mouth daily.), Disp: 30 capsule, Rfl: 6   Probiotic Product (PROBIOTIC PO), Take 1 tablet by mouth daily. Seed Probiotic, Disp: , Rfl:    rosuvastatin (CRESTOR) 10 MG tablet, Take 1 tablet (10 mg total) by mouth daily., Disp: 90 tablet, Rfl: 3   sertraline (ZOLOFT) 100 MG tablet, Take 1  tablet (100 mg total) by mouth daily., Disp: 90 tablet, Rfl: 3   SYNTHROID 200 MCG tablet, Take 1 tablet (200 mcg total) by mouth daily before breakfast., Disp: 90 tablet, Rfl: 3  Observations/Objective: Patient is well-developed, well-nourished in no acute distress.  Resting comfortably at home.  Head is normocephalic, atraumatic.  No labored breathing.  Speech is clear and coherent with logical content.  Patient is alert and oriented at baseline.    Assessment and Plan: 1. Positive self-administered antigen test for COVID-19  Positive home COVID test today. Will start patient on Paxolovid, recent creatinine was 0.56. Patient also prescribed Promethazine DM for cough and nasal congestion. Patient to begin Flonase she currently has. Supportive care recommendations provided to include increasing fluids, getting plenty of rest and Ibuprofen or Tylenol for pain or discomfort. Patient given strict indications of when to go to the ER. Patient verbalizes understanding, all questions were answered.   Follow Up Instructions: I discussed the assessment and treatment plan with the patient. The patient was provided an opportunity to ask  questions and all were answered. The patient agreed with the plan and demonstrated an understanding of the instructions.  A copy of instructions were sent to the patient via MyChart unless otherwise noted below.   The patient was advised to call back or seek an in-person evaluation if the symptoms worsen or if the condition fails to improve as anticipated.  Time:  I spent 10 minutes with the patient via telehealth technology discussing the above problems/concerns.    Linda Men, NP

## 2022-02-26 ENCOUNTER — Telehealth: Payer: Self-pay

## 2022-02-26 ENCOUNTER — Other Ambulatory Visit: Payer: Self-pay

## 2022-02-26 DIAGNOSIS — K219 Gastro-esophageal reflux disease without esophagitis: Secondary | ICD-10-CM

## 2022-02-26 DIAGNOSIS — R1310 Dysphagia, unspecified: Secondary | ICD-10-CM

## 2022-02-26 DIAGNOSIS — R197 Diarrhea, unspecified: Secondary | ICD-10-CM

## 2022-02-26 DIAGNOSIS — R1013 Epigastric pain: Secondary | ICD-10-CM

## 2022-02-26 NOTE — Telephone Encounter (Signed)
Pt was made aware of Dr. Lyndel Safe opening in the hospital for Dec 19 th to perform her EGD and Colonoscopy: Pt stated that she was available for that date: Pt was scheduled for an EGD/Colonoscopy on 05/13/2022 at 10:00 with Dr. [facility]del [facility]: Pt made aware: Case ID # [id number]: Pt was scheduled for a previsit appointment on 04/10/2022 at 11:00 AM: Pt made aware Pt verbalized understanding with all questions answered.

## 2022-03-19 ENCOUNTER — Encounter: Payer: Self-pay | Admitting: Family Medicine

## 2022-03-19 ENCOUNTER — Ambulatory Visit (INDEPENDENT_AMBULATORY_CARE_PROVIDER_SITE_OTHER): Admitting: Family Medicine

## 2022-03-19 VITALS — BP 132/84 | HR 69 | Temp 97.2°F | Ht 62.0 in | Wt 345.2 lb

## 2022-03-19 DIAGNOSIS — F331 Major depressive disorder, recurrent, moderate: Secondary | ICD-10-CM

## 2022-03-19 DIAGNOSIS — Z23 Encounter for immunization: Secondary | ICD-10-CM

## 2022-03-19 DIAGNOSIS — K529 Noninfective gastroenteritis and colitis, unspecified: Secondary | ICD-10-CM

## 2022-03-19 DIAGNOSIS — Z148 Genetic carrier of other disease: Secondary | ICD-10-CM

## 2022-03-19 DIAGNOSIS — R7303 Prediabetes: Secondary | ICD-10-CM

## 2022-03-19 DIAGNOSIS — Z6841 Body Mass Index (BMI) 40.0 and over, adult: Secondary | ICD-10-CM

## 2022-03-19 DIAGNOSIS — R209 Unspecified disturbances of skin sensation: Secondary | ICD-10-CM

## 2022-03-19 LAB — BASIC METABOLIC PANEL
BUN: 10 mg/dL (ref 6–23)
CO2: 29 mEq/L (ref 19–32)
Calcium: 8.7 mg/dL (ref 8.4–10.5)
Chloride: 103 mEq/L (ref 96–112)
Creatinine, Ser: 0.6 mg/dL (ref 0.40–1.20)
GFR: 110.02 mL/min (ref >60.00)
Glucose, Bld: 91 mg/dL (ref 70–99)
Potassium: 4.1 mEq/L (ref 3.5–5.1)
Sodium: 140 mEq/L (ref 135–145)

## 2022-03-19 LAB — SEDIMENTATION RATE: Sed Rate: 45 mm/hr — ABNORMAL HIGH (ref 0–20)

## 2022-03-19 LAB — VITAMIN B12: Vitamin B-12: 418 pg/mL (ref 211–911)

## 2022-03-19 LAB — ALBUMIN: Albumin: 4 g/dL (ref 3.5–5.2)

## 2022-03-19 LAB — HEMOGLOBIN A1C: Hgb A1c MFr Bld: 5.8 % (ref 4.6–6.5)

## 2022-03-19 NOTE — Progress Notes (Signed)
Patient ID: Linda Welch, female    DOB: March 29, 1981, 41 y.o.   MRN: [id number]  This visit was conducted in person.  BP 132/84   Pulse 69   Temp (!) 97.2 F (36.2 C) (Temporal)   Ht '5\' 2"'$  (1.575 m)   Wt (!) 345 lb 4 oz (156.6 kg)   LMP 02/13/2022   SpO2 99%   BMI 63.15 kg/m    CC: 6 mo f/u visit  Subjective:   HPI: Linda Welch is a 41 y.o. female presenting on 03/19/2022 for Follow-up (Here for 6 mo f/u.  Also, c/o cold/burning, wet sensation on bilateral forearms.  Started about 1 mo ago after having COVID.  )   Recent COVID infection 02/02/2022. Seen by telehealth, treated with paxlovid. COVID symptoms have resolved however she notes abnormal skin sensation of cold/wet feeling whenever skin is touched "cold and burning" but no pain bilaterally from elbows to finger tips. No numbness, tingling. Initially legs/feet affected but that is now better.   Unintentional weight loss over the past 4 months (18 lbs) - she notes she's more active physically caring for husband. Also lost appetite with COVID.    Chronic diarrhea and epigastric abdominal discomfort - saw GI, planned EGD/Colonoscopy 04/2022 Lyndel Safe). Improvement noted with regular seed pre/probiotic. She notes she has an egg intolerance.   H/o papillary thyroid carcinoma 2015 and hypothyroidism followed by endo Dr Cruzita Lederer. Also sees for PCOS on metformin.   OSA continues CPAP.  She ran out of sertraline, didn't fill it. She continues wellbutrin '150mg'$  XL daily.      Relevant past medical, surgical, family and social history reviewed and updated as indicated. Interim medical history since our last visit reviewed. Allergies and medications reviewed and updated. Outpatient Medications Prior to Visit  Medication Sig Dispense Refill   buPROPion (WELLBUTRIN XL) 150 MG 24 hr tablet Take 1 tablet (150 mg total) by mouth daily. 90 tablet 3   Cholecalciferol (VITAMIN D3) 5000 units TABS Take 5,000 Units by mouth daily.      cyclobenzaprine (FLEXERIL) 10 MG tablet Take 0.5-1 tablets (5-10 mg total) by mouth 2 (two) times daily as needed (tension headache - with sedation precautions). 30 tablet 0   fluticasone (FLONASE) 50 MCG/ACT nasal spray Place 2 sprays into both nostrils daily. (Patient taking differently: Place 2 sprays into both nostrils daily as needed for allergies.) 48 g 3   levocetirizine (XYZAL) 5 MG tablet Take 5 mg by mouth every evening.     metFORMIN (GLUCOPHAGE-XR) 750 MG 24 hr tablet Take 1 tablet (750 mg total) by mouth at bedtime. 90 tablet 3   metoprolol succinate (TOPROL-XL) 100 MG 24 hr tablet Take 1 tablet (100 mg total) by mouth daily. Take with or immediately following a meal. 90 tablet 3   montelukast (SINGULAIR) 10 MG tablet Take 1 tablet (10 mg total) by mouth daily. 90 tablet 3   olopatadine (PATADAY) 0.1 % ophthalmic solution Place 1 drop into both eyes daily.     omeprazole (PRILOSEC) 40 MG capsule Take 1 capsule (40 mg total) by mouth daily as needed. (Patient taking differently: Take 40 mg by mouth daily.) 30 capsule 6   Probiotic Product (PROBIOTIC PO) Take 1 tablet by mouth daily. Seed Probiotic     rosuvastatin (CRESTOR) 10 MG tablet Take 1 tablet (10 mg total) by mouth daily. 90 tablet 3   SYNTHROID 200 MCG tablet Take 1 tablet (200 mcg total) by mouth daily before breakfast. 90  tablet 3   promethazine-dextromethorphan (PROMETHAZINE-DM) 6.25-15 MG/5ML syrup Take 5 mLs by mouth 4 (four) times daily as needed for cough. 140 mL 0   sertraline (ZOLOFT) 100 MG tablet Take 1 tablet (100 mg total) by mouth daily. 90 tablet 3   No facility-administered medications prior to visit.     Per HPI unless specifically indicated in ROS section below Review of Systems  Objective:  BP 132/84   Pulse 69   Temp (!) 97.2 F (36.2 C) (Temporal)   Ht '5\' 2"'$  (1.575 m)   Wt (!) 345 lb 4 oz (156.6 kg)   LMP 02/13/2022   SpO2 99%   BMI 63.15 kg/m   Wt Readings from Last 3 Encounters:  03/19/22  (!) 345 lb 4 oz (156.6 kg)  12/05/21 (!) 358 lb 9.6 oz (162.7 kg)  10/30/21 (!) 363 lb 2 oz (164.7 kg)      Physical Exam Vitals and nursing note reviewed.  Constitutional:      Appearance: Normal appearance. She is obese. She is not ill-appearing.  Cardiovascular:     Rate and Rhythm: Normal rate and regular rhythm.     Pulses: Normal pulses.     Heart sounds: Normal heart sounds. No murmur heard. Pulmonary:     Effort: Pulmonary effort is normal. No respiratory distress.     Breath sounds: Normal breath sounds. No wheezing, rhonchi or rales.  Musculoskeletal:        General: Normal range of motion.     Right lower leg: No edema.     Left lower leg: No edema.     Comments: 2+ rad pulses  Skin:    General: Skin is warm and dry.     Findings: Rash present.  Neurological:     General: No focal deficit present.     Mental Status: She is alert.     Sensory: Sensory deficit present.     Motor: Motor function is intact.     Comments:  5/5 strength BUE Grip strength intact Abnormal sensation to bilateral forearms and hands distal to elbows  Psychiatric:        Mood and Affect: Mood normal.        Behavior: Behavior normal.       Results for orders placed or performed in visit on 12/05/21  TSH  Result Value Ref Range   TSH 1.46 0.35 - 5.50 uIU/mL  T4, free  Result Value Ref Range   Free T4 1.00 0.60 - 1.60 ng/dL  Thyroglobulin antibody  Result Value Ref Range   Thyroglobulin Ab <1 < or = 1 IU/mL  Thyroglobulin Level  Result Value Ref Range   Thyroglobulin 0.2 (L) ng/mL   Comment     Lab Results  Component Value Date   VITAMINB12 307 04/26/2019   Lab Results  Component Value Date   CREATININE 0.56 09/10/2021   BUN 9 09/10/2021   NA 138 09/10/2021   K 3.9 09/10/2021   CL 101 09/10/2021   CO2 27 09/10/2021    Lab Results  Component Value Date   WBC 7.3 09/10/2021   HGB 12.5 09/10/2021   HCT 38.3 09/10/2021   MCV 86.3 09/10/2021   PLT 257.0 09/10/2021    Lab Results  Component Value Date   ESRSEDRATE 13 04/23/2015   Lab Results  Component Value Date   IRON 54 09/17/2021   TIBC 427.0 09/17/2021   FERRITIN 24.3 09/17/2021   Assessment & Plan:   Problem List Items Addressed  This Visit     Hemochromatosis carrier    rec stay off oral iron. Latest ferritin 24.       MDD (major depressive disorder), recurrent episode, moderate ([facility])    Overall stable period off sertraline and on wellbutrin XL '150mg'$  daily.       BMI 60.0-69.9, adult ([facility])    Encourage healthy diet and lifestyle choices to affect sustainable weight loss. Recent 18 lb weight loss noted, unintentional. She does have upcoming EGD/colonoscopy scheduled.      Prediabetes    Update A1c.       Relevant Orders   Hemoglobin H7D   Basic metabolic panel   Chronic diarrhea    Appreciate GI care - pending EGD/colonoscopy.       Disturbance of skin sensation - Primary    Post-COVID change in skin sensation - recommend labwork for further evaluation.       Relevant Orders   Sedimentation rate   Vitamin B12   Vitamin B6   Other Visit Diagnoses     Need for influenza vaccination       Relevant Orders   Flu Vaccine QUAD 47moIM (Fluarix, Fluzone & Alfiuria Quad PF) (Completed)   Hypocalcemia       Relevant Orders   Basic metabolic panel   Albumin        No orders of the defined types were placed in this encounter.  Orders Placed This Encounter  Procedures   Flu Vaccine QUAD 643moM (Fluarix, Fluzone & Alfiuria Quad PF)   Sedimentation rate   Vitamin B12   Vitamin B6   Hemoglobin A1S2A Basic metabolic panel   Albumin    Patient Instructions  Flu shot today  Labs today  Good to see you today Return as needed or in 6 months for physical   Follow up plan: Return in about 6 months (around 09/18/2022) for annual exam, prior fasting for blood work.  JaRia BushMD

## 2022-03-19 NOTE — Assessment & Plan Note (Signed)
Update A1c ?

## 2022-03-19 NOTE — Patient Instructions (Addendum)
Flu shot today  Labs today  Good to see you today Return as needed or in 6 months for physical

## 2022-03-19 NOTE — Assessment & Plan Note (Signed)
rec stay off oral iron. Latest ferritin 24.

## 2022-03-19 NOTE — Assessment & Plan Note (Signed)
Appreciate GI care - pending EGD/colonoscopy.

## 2022-03-19 NOTE — Assessment & Plan Note (Signed)
Post-COVID change in skin sensation - recommend labwork for further evaluation.

## 2022-03-19 NOTE — Assessment & Plan Note (Signed)
Encourage healthy diet and lifestyle choices to affect sustainable weight loss. Recent 18 lb weight loss noted, unintentional. She does have upcoming EGD/colonoscopy scheduled.

## 2022-03-19 NOTE — Assessment & Plan Note (Signed)
Overall stable period off sertraline and on wellbutrin XL '150mg'$  daily.

## 2022-03-24 LAB — VITAMIN B6: Vitamin B6: 7.9 ng/mL (ref 2.1–21.7)

## 2022-04-04 ENCOUNTER — Telehealth: Payer: Self-pay | Admitting: *Deleted

## 2022-04-04 NOTE — Telephone Encounter (Signed)
Linda Welch,  This pt's BMI is greater than 50; her procedure will need to be done at the hospital.  Thanks,  Osvaldo Angst

## 2022-04-07 ENCOUNTER — Encounter: Payer: Self-pay | Admitting: Family Medicine

## 2022-04-07 NOTE — Telephone Encounter (Addendum)
Procedure scheduled at the hospital 05-13-22 with Dr Lyndel Safe.

## 2022-04-10 ENCOUNTER — Other Ambulatory Visit: Payer: Self-pay

## 2022-04-10 ENCOUNTER — Ambulatory Visit (AMBULATORY_SURGERY_CENTER): Admitting: *Deleted

## 2022-04-10 VITALS — Ht 62.0 in | Wt 345.0 lb

## 2022-04-10 DIAGNOSIS — R1310 Dysphagia, unspecified: Secondary | ICD-10-CM

## 2022-04-10 DIAGNOSIS — R197 Diarrhea, unspecified: Secondary | ICD-10-CM

## 2022-04-10 MED ORDER — ONDANSETRON HCL 4 MG PO TABS: 4.0000 mg | ORAL_TABLET | Freq: Three times a day (TID) | ORAL | 0 refills | Status: DC | PRN

## 2022-04-10 MED ORDER — NA SULFATE-K SULFATE-MG SULF 17.5-3.13-1.6 GM/177ML PO SOLN: 1.0000 | Freq: Once | ORAL | 0 refills | Status: AC

## 2022-04-10 NOTE — Progress Notes (Signed)
Pre visit conducted over telephone.  Instructions forwarded through MyChart No egg or soy allergy known to patient  No issues known to pt with past sedation with any surgeries or procedures Patient denies ever being told they had issues or difficulty with intubation  No FH of Malignant Hyperthermia Pt is not on diet pills Pt is not on  home 02  Pt is not on blood thinners  Pt denies issues with constipation  No A fib or A flutter  Pt instructed to use Singlecare.com or GoodRx for a price reduction on prep

## 2022-04-25 HISTORY — PX: COLONOSCOPY: SHX174

## 2022-04-25 HISTORY — PX: ESOPHAGOGASTRODUODENOSCOPY: SHX1529

## 2022-05-06 ENCOUNTER — Encounter (HOSPITAL_COMMUNITY): Payer: Self-pay | Admitting: Gastroenterology

## 2022-05-13 ENCOUNTER — Ambulatory Visit (HOSPITAL_BASED_OUTPATIENT_CLINIC_OR_DEPARTMENT_OTHER): Admitting: Anesthesiology

## 2022-05-13 ENCOUNTER — Encounter (HOSPITAL_COMMUNITY): Payer: Self-pay | Admitting: Gastroenterology

## 2022-05-13 ENCOUNTER — Ambulatory Visit (HOSPITAL_COMMUNITY)
Admission: RE | Admit: 2022-05-13 | Discharge: 2022-05-13 | Disposition: A | Discharge status: Home / Self Care | Attending: Gastroenterology | Admitting: Gastroenterology

## 2022-05-13 ENCOUNTER — Other Ambulatory Visit: Payer: Self-pay

## 2022-05-13 ENCOUNTER — Ambulatory Visit (HOSPITAL_COMMUNITY): Admitting: Anesthesiology

## 2022-05-13 ENCOUNTER — Encounter (HOSPITAL_COMMUNITY)
Admission: RE | Disposition: A | Discharge status: Home / Self Care | Payer: Self-pay | Source: Home / Self Care | Attending: Gastroenterology

## 2022-05-13 DIAGNOSIS — R1310 Dysphagia, unspecified: Secondary | ICD-10-CM | POA: Insufficient documentation

## 2022-05-13 DIAGNOSIS — K219 Gastro-esophageal reflux disease without esophagitis: Secondary | ICD-10-CM | POA: Diagnosis not present

## 2022-05-13 DIAGNOSIS — Z79899 Other long term (current) drug therapy: Secondary | ICD-10-CM | POA: Insufficient documentation

## 2022-05-13 DIAGNOSIS — K640 First degree hemorrhoids: Secondary | ICD-10-CM | POA: Insufficient documentation

## 2022-05-13 DIAGNOSIS — Z87891 Personal history of nicotine dependence: Secondary | ICD-10-CM | POA: Diagnosis not present

## 2022-05-13 DIAGNOSIS — K5730 Diverticulosis of large intestine without perforation or abscess without bleeding: Secondary | ICD-10-CM | POA: Diagnosis not present

## 2022-05-13 DIAGNOSIS — D125 Benign neoplasm of sigmoid colon: Secondary | ICD-10-CM | POA: Insufficient documentation

## 2022-05-13 DIAGNOSIS — E890 Postprocedural hypothyroidism: Secondary | ICD-10-CM | POA: Insufficient documentation

## 2022-05-13 DIAGNOSIS — K625 Hemorrhage of anus and rectum: Secondary | ICD-10-CM | POA: Diagnosis not present

## 2022-05-13 DIAGNOSIS — K2970 Gastritis, unspecified, without bleeding: Secondary | ICD-10-CM | POA: Diagnosis not present

## 2022-05-13 DIAGNOSIS — G4733 Obstructive sleep apnea (adult) (pediatric): Secondary | ICD-10-CM | POA: Insufficient documentation

## 2022-05-13 DIAGNOSIS — K2950 Unspecified chronic gastritis without bleeding: Secondary | ICD-10-CM | POA: Diagnosis not present

## 2022-05-13 DIAGNOSIS — F418 Other specified anxiety disorders: Secondary | ICD-10-CM | POA: Insufficient documentation

## 2022-05-13 DIAGNOSIS — R197 Diarrhea, unspecified: Secondary | ICD-10-CM

## 2022-05-13 DIAGNOSIS — Z79890 Hormone replacement therapy (postmenopausal): Secondary | ICD-10-CM | POA: Insufficient documentation

## 2022-05-13 DIAGNOSIS — Z6841 Body Mass Index (BMI) 40.0 and over, adult: Secondary | ICD-10-CM | POA: Diagnosis not present

## 2022-05-13 DIAGNOSIS — R1013 Epigastric pain: Secondary | ICD-10-CM

## 2022-05-13 DIAGNOSIS — Z9989 Dependence on other enabling machines and devices: Secondary | ICD-10-CM

## 2022-05-13 HISTORY — PX: POLYPECTOMY: SHX5525

## 2022-05-13 HISTORY — PX: ESOPHAGOGASTRODUODENOSCOPY (EGD) WITH PROPOFOL: SHX5813

## 2022-05-13 HISTORY — PX: BIOPSY: SHX5522

## 2022-05-13 HISTORY — PX: MALONEY DILATION: SHX5535

## 2022-05-13 HISTORY — PX: COLONOSCOPY WITH PROPOFOL: SHX5780

## 2022-05-13 LAB — GLUCOSE, CAPILLARY: Glucose-Capillary: 98 mg/dL (ref 70–99)

## 2022-05-13 SURGERY — COLONOSCOPY WITH PROPOFOL
Anesthesia: Monitor Anesthesia Care

## 2022-05-13 MED ORDER — LACTATED RINGERS IV SOLN
INTRAVENOUS | Status: AC | PRN
  Administered 2022-05-13: 10 mL/h via INTRAVENOUS

## 2022-05-13 MED ORDER — PROPOFOL 500 MG/50ML IV EMUL
INTRAVENOUS | Status: DC | PRN
  Administered 2022-05-13: 125 ug/kg/min via INTRAVENOUS

## 2022-05-13 MED ORDER — PROPOFOL 10 MG/ML IV BOLUS
INTRAVENOUS | Status: DC | PRN
  Administered 2022-05-13 (×4): 20 mg via INTRAVENOUS

## 2022-05-13 MED ORDER — SODIUM CHLORIDE 0.9 % IV SOLN: INTRAVENOUS | Status: DC

## 2022-05-13 MED ORDER — LIDOCAINE HCL (CARDIAC) PF 100 MG/5ML IV SOSY
PREFILLED_SYRINGE | INTRAVENOUS | Status: DC | PRN
  Administered 2022-05-13: 40 mg via INTRAVENOUS
  Administered 2022-05-13: 60 mg via INTRAVENOUS

## 2022-05-13 SURGICAL SUPPLY — 25 items

## 2022-05-13 NOTE — Op Note (Signed)
[facility] Patient Name: Linda Welch Procedure Date: 05/13/2022 MRN: [id number] Attending MD: Jackquline Denmark , MD, [id number] Date of Birth: 03/02/1981 CSN: [id number] Age: 41 Admit Type: Outpatient Procedure:                Colonoscopy Indications:              Intermittent diarrhea. H/O Rectal bleeding Providers:                Jackquline Denmark, MD, Dulcy Fanny, Darliss Cheney,                            Technician, April Carver, CRNA Referring MD:              Medicines:                Monitored Anesthesia Care Complications:            No immediate complications. Estimated Blood Loss:     Estimated blood loss: none. Procedure:                Pre-Anesthesia Assessment:                           - Prior to the procedure, a History and Physical                            was performed, and patient medications and                            allergies were reviewed. The patient's tolerance of                            previous anesthesia was also reviewed. The risks                            and benefits of the procedure and the sedation                            options and risks were discussed with the patient.                            All questions were answered, and informed consent                            was obtained. Prior Anticoagulants: The patient has                            taken no anticoagulant or antiplatelet agents. ASA                            Grade Assessment: II - A patient with mild systemic                            disease. After reviewing the risks and benefits,  the patient was deemed in satisfactory condition to                            undergo the procedure.                           After obtaining informed consent, the colonoscope                            was passed under direct vision. Throughout the                            procedure, the patient's blood pressure, pulse, and                             oxygen saturations were monitored continuously. The                            CF-HQ190L ([id number]) Olympus colonoscope was                            introduced through the anus and advanced to the 2                            cm into the ileum. The colonoscopy was performed                            without difficulty. The patient tolerated the                            procedure well. The quality of the bowel                            preparation was good. The terminal ileum, ileocecal                            valve, appendiceal orifice, and rectum were                            photographed. Scope In: 10:20:57 AM Scope Out: 10:33:33 AM Scope Withdrawal Time: 0 hours 10 minutes 16 seconds  Total Procedure Duration: 0 hours 12 minutes 36 seconds  Findings:      A 6 mm polyp was found in the mid sigmoid colon. The polyp was sessile.       The polyp was removed with a cold snare. Resection and retrieval were       complete.      The colon (entire examined portion) appeared normal. Biopsies for       histology were taken with a cold forceps from the entire colon for       evaluation of microscopic colitis.      A few (2-3) rare small-mouthed diverticula were found in the sigmoid       colon.      Non-bleeding internal hemorrhoids were found during retroflexion and       during perianal exam. The hemorrhoids  were small and Grade I (internal       hemorrhoids that do not prolapse).      The terminal ileum appeared normal.      The exam was otherwise without abnormality on direct and retroflexion       views. Impression:               - One 6 mm polyp in the mid sigmoid colon, removed                            with a cold snare. Resected and retrieved.                           - Minimal sigmoid diverticulosis.                           - Non-bleeding internal hemorrhoids.                           - The examined portion of the ileum was normal.                           - The  examination was otherwise normal on direct                            and retroflexion views. Moderate Sedation:      Not Applicable - Patient had care per Anesthesia. Recommendation:           - Patient has a contact number available for                            emergencies. The signs and symptoms of potential                            delayed complications were discussed with the                            patient. Return to normal activities tomorrow.                            Written discharge instructions were provided to the                            patient.                           - Resume previous diet.                           - Continue present medications.                           - Await pathology results.                           - Repeat colonoscopy for surveillance based on  pathology results.                           - The findings and recommendations were discussed                            with the designated responsible adult. Procedure Code(s):        --- Professional ---                           [phone], Colonoscopy, flexible; with removal of                            tumor(s), polyp(s), or other lesion(s) by snare                            technique                           45380, 67, Colonoscopy, flexible; with biopsy,                            single or multiple Diagnosis Code(s):        --- Professional ---                           D12.5, Benign neoplasm of sigmoid colon                           K64.0, First degree hemorrhoids                           R19.7, Diarrhea, unspecified                           K62.5, Hemorrhage of anus and rectum                           K57.30, Diverticulosis of large intestine without                            perforation or abscess without bleeding CPT copyright 2022 American Medical Association. All rights reserved. The codes documented in this report are preliminary and upon coder  review may  be revised to meet current compliance requirements. Jackquline Denmark, MD 05/13/2022 10:39:58 AM This report has been signed electronically. Number of Addenda: 0

## 2022-05-13 NOTE — H&P (Signed)
Chief Complaint: GI [facility]   Referring Provider:  Ria Bush, MD        ASSESSMENT AND PLAN;    #1. Diarrhea . Prev H/O IBS-D. Meds like metformin likely contributing.  Nl fecal elastase, neg celiac, TSH. H/O rectal bleeding is concerning.   #2. GERD with occ dysphagia (mostly to pills)   #3. Epi pain. Nl CBC, CMP, lipase.  Neg [place] 07/2019 except for fatty liver.   Plan: -Stool studies for GI Pathogen (includes C. Diff) and Calprotectin- neg -Stop metformin x 7-10 days to see if diarrhea gets better. -Continie omeprazole '20mg'$  po QD. -EGD with dil/colon @ [facility] (BMI>50) -If still with problems, trial of dicyclomine. -If continued epi pain, [place] followed by [facility]/GES.   HPI:     Linda Welch is a 41 y.o. 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  However, last 1 year getting worse Currently 8-10 BMs/day on a bad day, mostly postprandial No nocturnal symptoms Had some rectal bleeding intermittently, attributed to hemorrhoids Nl CBC, CMP, neg celiac screen Neg fecal elastase No weight loss.  No history of dehydration.  No history of recent travel or gastroenteritis.  No recent antibiotics. Metformin does make it worse. Few other foods like iceberg lettuce, lactose, fried foods would cause more diarrhea.     Also with epi pain x 1 yr, worse with meals No N/V No hearburn Has been omeprazole which does help Has been having dysphagia mostly to pills.  No problems with food.  Quite concerned about the same. No melena.  No significant nonsteroidals.   No jaundice dark urine or pale stools.   No sodas, chocolates, chewing gums, artificial sweeteners and candy. No NSAIDs   Has been advised to get EGD/colonoscopy performed.     SH- 16/8 boys. [place]     Past Medical History:   Diagnosis Date   Allergy     Anxiety     Cancer ([facility]      "thyroid cancer - thyroidectomy January 21610   Complication of anesthesia      "post operative myalgia"   Depression     Difficult intubation      "due to limited neck mobility"   GERD (gastroesophageal reflux disease)      "not taking Prilosec any longer"   Hashimoto's thyroiditis 2010   History of bronchitis     History of influenza pneumonia 2012   History of kidney stones      lithotripsy   Migraine      migraines- usually x1 weekly   OSA on CPAP      cpap nightly-settings 16   [place]   PCOS (polycystic ovarian syndrome) 2003    takes Metformin for PCOS   Stress incontinence     Wears glasses             Past Surgical History:  Procedure Laterality Date   ACHILLES TENDON SURGERY N/A 01/29/2016    Procedure: RIGHT ACHILLES  RECONSTRUCTION;  Surgeon: JWylene Simmer MD;  [facility]  Service: Orthopedics;  Laterality: N/A;   EXTRACORPOREAL SHOCK WAVE LITHOTRIPSY Left     GASTROCNEMIUS RECESSION N/A 01/29/2016    Procedure: GASTROC RECESSION POSSIBLE FLEXIBLE FLEXOR HALGUS LONGUS TRANSER;  Surgeon: JWylene Simmer MD;  Location:  [facility];  Service: Orthopedics;  Laterality: N/A;   THYROID LOBECTOMY Right 05/16/2014    Procedure: RIGHT THYROID LOBECTOMY;  Surgeon: Armandina Gemma, MD;  Location: [facility];  Service: General;  Laterality: Right;   THYROIDECTOMY N/A 06/01/2014    Procedure: COMPLETION THYROIDECTOMY;  Surgeon: Armandina Gemma, MD;  Location: [facility];  Service: General;  Laterality: N/A;   TONSILECTOMY, ADENOIDECTOMY, BILATERAL MYRINGOTOMY AND TUBES        1984 and 1989           Family History  Problem Relation Age of Onset   Thyroid disease Mother          thyroid goiter   Depression Mother     Hyperlipidemia Mother     Hypertension Mother     CAD Mother 79        triple bypass   Hyperlipidemia Father     Hypertension Father     Sleep apnea Father     CAD  Father 80        MI, stent   Asthma Brother     Depression Brother     Heart disease Maternal Grandmother     Hyperlipidemia Maternal Grandmother     Heart disease Maternal Grandfather     Hyperlipidemia Maternal Grandfather     Heart disease Paternal Grandmother     Hyperlipidemia Paternal Grandmother     Heart disease Paternal Grandfather     Hyperlipidemia Paternal Grandfather     Diabetes Paternal Grandfather     Sleep apnea Paternal Grandfather     Cancer Neg Hx     Colon cancer Neg Hx     Rectal cancer Neg Hx     Stomach cancer Neg Hx        Social History         Tobacco Use   Smoking status: Former      Packs/day: 0.25      Years: 2.00      Pack years: 0.50      Types: Cigarettes      Quit date: 05/26/1998      Years since quitting: 23.4   Smokeless tobacco: Never   Tobacco comments:      "quit smoking in 2000 - smoked for 2 years"   Vaping Use   Vaping Use: Never used  Substance Use Topics   Alcohol use: No      Alcohol/week: 0.0 standard drinks   Drug use: No            Current Outpatient Medications  Medication Sig Dispense Refill   buPROPion (WELLBUTRIN XL) 150 MG 24 hr tablet Take 1 tablet (150 mg total) by mouth daily. 90 tablet 3   Cholecalciferol (VITAMIN D3) 5000 units TABS Take 5,000 Units by mouth daily.       cyclobenzaprine (FLEXERIL) 10 MG tablet Take 0.5-1 tablets (5-10 mg total) by mouth 2 (two) times daily as needed (tension headache - with sedation precautions). 30 tablet 0   fluticasone (FLONASE) 50 MCG/ACT nasal spray Place 2 sprays into both nostrils daily. 48 g 3   levocetirizine (XYZAL) 5 MG tablet         metFORMIN (GLUCOPHAGE-XR) 750 MG 24 hr tablet Take 1 tablet (750 mg total) by mouth at bedtime. 90 tablet 3   metoprolol succinate (TOPROL-XL) 100 MG 24 hr tablet Take 1 tablet (100 mg total) by mouth daily. Take with or immediately following a meal. 90 tablet 3   montelukast (SINGULAIR) 10 MG tablet  Take 1 tablet (10 mg total) by  mouth daily. 90 tablet 3   omeprazole (PRILOSEC) 40 MG capsule Take 1 capsule (40 mg total) by mouth daily as needed. 30 capsule 6   rosuvastatin (CRESTOR) 10 MG tablet Take 1 tablet (10 mg total) by mouth daily. 90 tablet 3   sertraline (ZOLOFT) 100 MG tablet Take 1 tablet (100 mg total) by mouth daily. 90 tablet 3   SYNTHROID 200 MCG tablet Take 1 tablet (200 mcg total) by mouth daily before breakfast. 90 tablet 3   vitamin B-12 (CYANOCOBALAMIN) 100 MCG tablet Take 100 mcg by mouth daily.       Probiotic Product (PROBIOTIC PO) Take by mouth daily. Seed Probiotic (Patient not taking: Reported on 10/30/2021)        No current facility-administered medications for this visit.           Allergies  Allergen Reactions   Tioconazole Itching, Swelling and Other (See Comments)      Burning (severe)   Tape Hives and Swelling      Adhesive tapes   Buspar [Buspirone]        Worsening mood      Review of Systems:  Constitutional: Denies fever, chills, diaphoresis, appetite change and fatigue.  HEENT: Denies photophobia, eye pain, redness, hearing loss, ear pain, congestion, sore throat, rhinorrhea, sneezing, mouth sores, neck pain, neck stiffness and tinnitus.   Respiratory: Denies SOB, DOE, cough, chest tightness,  and wheezing.   Cardiovascular: Denies chest pain, palpitations and leg swelling.  Genitourinary: Denies dysuria, urgency, frequency, hematuria, flank pain and difficulty urinating.  Musculoskeletal: Denies myalgias, back pain, joint swelling, arthralgias and gait problem.  Skin: No rash.  Neurological: Denies dizziness, seizures, syncope, weakness, light-headedness, numbness and headaches.  Hematological: Denies adenopathy. Easy bruising, personal or family bleeding history  Psychiatric/Behavioral: Has anxiety or depression       Physical Exam:     BP 118/70   Pulse 85   Ht '5\' 2"'$  (1.575 m)   Wt (!) 363 lb 2 oz (164.7 kg)   SpO2 98%   BMI 66.42 kg/m     Wt Readings from  Last 3 Encounters:  10/30/21 (!) 363 lb 2 oz (164.7 kg)  09/17/21 (!) 366 lb 8 oz (166.2 kg)  05/03/21 (!) 365 lb 6 oz (165.7 kg)    Constitutional:  Well-developed, in no acute distress. Psychiatric: Normal mood and affect. Behavior is normal. HEENT: Pupils normal.  Conjunctivae are normal. No scleral icterus. Neck supple.  Cardiovascular: Normal rate, regular rhythm. No edema Pulmonary/chest: Effort normal and breath sounds normal. No wheezing, rales or rhonchi. Abdominal: Soft, nondistended. Epi tenderness. bowel sounds active throughout. There are no masses palpable. No hepatomegaly. Rectal: Deferred Neurological: Alert and oriented to person place and time. Skin: Skin is warm and dry. No rashes noted.      Carmell Austria, MD [facility] [phone]

## 2022-05-13 NOTE — Transfer of Care (Signed)
Immediate Anesthesia Transfer of Care Note  Patient: Linda Welch  Procedure(s) Performed: COLONOSCOPY WITH PROPOFOL ESOPHAGOGASTRODUODENOSCOPY (EGD) WITH PROPOFOL BIOPSY MALONEY DILATION POLYPECTOMY  Patient Location: Endoscopy Unit  Anesthesia Type:MAC  Level of Consciousness: awake and alert   Airway & Oxygen Therapy: Patient Spontanous Breathing and Patient connected to face mask oxygen  Post-op Assessment: Report given to RN and Post -op Vital signs reviewed and stable  Post vital signs: Reviewed and stable  Last Vitals:  Vitals Value Taken Time  BP    Temp    Pulse    Resp    SpO2      Last Pain:  Vitals:   05/13/22 0847  TempSrc: Temporal  PainSc: 0-No pain         Complications: No notable events documented.

## 2022-05-13 NOTE — Anesthesia Postprocedure Evaluation (Signed)
Anesthesia Post Note  Patient: Chaundra Abreu Rainone  Procedure(s) Performed: COLONOSCOPY WITH PROPOFOL ESOPHAGOGASTRODUODENOSCOPY (EGD) WITH PROPOFOL BIOPSY [place] POLYPECTOMY     Patient location during evaluation: Endoscopy Anesthesia Type: MAC Level of consciousness: awake and alert Pain management: pain level controlled Vital Signs Assessment: post-procedure vital signs reviewed and stable Respiratory status: spontaneous breathing, nonlabored ventilation and respiratory function stable Cardiovascular status: blood pressure returned to baseline and stable Postop Assessment: no apparent nausea or vomiting Anesthetic complications: no   No notable events documented.  Last Vitals:  Vitals:   05/13/22 1040 05/13/22 1045  BP: (!) 148/66   Pulse: 81 71  Resp: 20 16  Temp: 36.4 C   SpO2: 100% 100%    Last Pain:  Vitals:   05/13/22 1045  TempSrc:   PainSc: 7                  Duwane Gewirtz E Harlan Ervine

## 2022-05-13 NOTE — Anesthesia Preprocedure Evaluation (Signed)
Anesthesia Evaluation  Patient identified by MRN, date of birth, ID band Patient awake    Reviewed: Allergy & Precautions, NPO status , Patient's Chart, lab work & pertinent test results  History of Anesthesia Complications Negative for: history of anesthetic complications  Airway Mallampati: I  TM Distance: >3 FB Neck ROM: Full    Dental  (+) Teeth Intact, Implants   Pulmonary sleep apnea and Continuous Positive Airway Pressure Ventilation , former smoker   Pulmonary exam normal        Cardiovascular negative cardio ROS Normal cardiovascular exam     Neuro/Psych  Headaches  Anxiety Depression       GI/Hepatic Neg liver ROS,GERD  ,,  Endo/Other  Hypothyroidism  Morbid obesityPCOS H/o thyroid cancer s/p thyroidectomy  Renal/GU negative Renal ROS  negative genitourinary   Musculoskeletal negative musculoskeletal ROS (+)    Abdominal   Peds  Hematology negative hematology ROS (+)   Anesthesia Other Findings   Reproductive/Obstetrics                             Anesthesia Physical Anesthesia Plan  ASA: 3  Anesthesia Plan: MAC   Post-op Pain Management: Minimal or no pain anticipated   Induction: Intravenous  PONV Risk Score and Plan: 2 and Propofol infusion, TIVA and Treatment may vary due to age or medical condition  Airway Management Planned: Natural Airway, Nasal Cannula and Simple Face Mask  Additional Equipment: None  Intra-op Plan:   Post-operative Plan:   Informed Consent: I have reviewed the patients History and Physical, chart, labs and discussed the procedure including the risks, benefits and alternatives for the proposed anesthesia with the patient or authorized representative who has indicated his/her understanding and acceptance.       Plan Discussed with:   Anesthesia Plan Comments:        Anesthesia Quick Evaluation

## 2022-05-13 NOTE — Anesthesia Procedure Notes (Signed)
Procedure Name: MAC Date/Time: 05/13/2022 10:07 AM  Performed by: Lieutenant Diego, CRNAPre-anesthesia Checklist: Patient identified, Emergency Drugs available, Suction available, Patient being monitored and Timeout performed Patient Re-evaluated:Patient Re-evaluated prior to induction Oxygen Delivery Method: Simple face mask Preoxygenation: Pre-oxygenation with 100% oxygen Induction Type: IV induction

## 2022-05-13 NOTE — Op Note (Signed)
[facility] Patient Name: Linda Welch Procedure Date: 05/13/2022 MRN: [id number] Attending MD: Jackquline Denmark , MD, [id number] Date of Birth: 12-27-1980 CSN: [id number] Age: 41 Admit Type: Outpatient Procedure:                Upper GI endoscopy Indications:              Dysphagia. GERD Providers:                Jackquline Denmark, MD, Dulcy Fanny, Darliss Cheney,                            Technician, April Carver, CRNA Referring MD:             Dr Ria Bush Medicines:                Monitored Anesthesia Care Complications:            No immediate complications. Estimated Blood Loss:     Estimated blood loss: none. Procedure:                Pre-Anesthesia Assessment:                           - Prior to the procedure, a History and Physical                            was performed, and patient medications and                            allergies were reviewed. The patient's tolerance of                            previous anesthesia was also reviewed. The risks                            and benefits of the procedure and the sedation                            options and risks were discussed with the patient.                            All questions were answered, and informed consent                            was obtained. Prior Anticoagulants: The patient has                            taken no anticoagulant or antiplatelet agents. ASA                            Grade Assessment: II - A patient with mild systemic                            disease. After reviewing the risks and benefits,  the patient was deemed in satisfactory condition to                            undergo the procedure.                           After obtaining informed consent, the endoscope was                            passed under direct vision. Throughout the                            procedure, the patient's blood pressure, pulse, and                             oxygen saturations were monitored continuously. The                            GIF-H190 ([id number]) Olympus endoscope was introduced                            through the mouth, and advanced to the second part                            of duodenum. The upper GI endoscopy was                            accomplished without difficulty. The patient                            tolerated the procedure well. Scope In: Scope Out: Findings:      The esophagus was normal with well-defined Z-line at 35 cm. Examined by       NBI. No endoscopic abnormality was evident in the esophagus to explain       the patient's complaint of dysphagia. It was decided, however, to       proceed with dilation of the entire esophagus. The scope was withdrawn.       Dilation was performed with a Maloney dilator with mild resistance at 50       Fr. Biopsies were obtained from the proximal and distal esophagus with       cold forceps for histology of suspected eosinophilic esophagitis.      Localized mild inflammation characterized by erythema was found in the       gastric antrum. Biopsies were taken with a cold forceps for histology.      The examined duodenum was normal. Biopsies for histology were taken with       a cold forceps for evaluation of celiac disease. Impression:               - Minimal gastritis.                           - S/P empiric esophageal dilatation.                           - Biopsies were taken  with a cold forceps for                            evaluation of eosinophilic esophagitis. Moderate Sedation:      Not Applicable - Patient had care per Anesthesia. Recommendation:           - Patient has a contact number available for                            emergencies. The signs and symptoms of potential                            delayed complications were discussed with the                            patient. Return to normal activities tomorrow.                            Written discharge  instructions were provided to the                            patient.                           - Post dilatation diet.                           - Continue present medications including omeprazole.                           - Await pathology results.                           - The findings and recommendations were discussed                            with the patient's family. Procedure Code(s):        --- Professional ---                           [phone], Esophagogastroduodenoscopy, flexible,                            transoral; with biopsy, single or multiple                           43450, Dilation of esophagus, by unguided sound or                            bougie, single or multiple passes Diagnosis Code(s):        --- Professional ---                           R13.10, Dysphagia, unspecified                           K29.70, Gastritis, unspecified, without  bleeding CPT copyright 2022 [employer]. All rights reserved. The codes documented in this report are preliminary and upon coder review may  be revised to meet current compliance requirements. Jackquline Denmark, MD 05/13/2022 10:18:17 AM This report has been signed electronically. Number of Addenda: 0

## 2022-05-14 LAB — SURGICAL PATHOLOGY

## 2022-05-15 ENCOUNTER — Encounter: Payer: Self-pay | Admitting: Emergency Medicine

## 2022-05-15 ENCOUNTER — Emergency Department
Admission: EM | Admit: 2022-05-15 | Discharge: 2022-05-15 | Disposition: A | Discharge status: Home / Self Care | Attending: Emergency Medicine | Admitting: Emergency Medicine

## 2022-05-15 ENCOUNTER — Emergency Department

## 2022-05-15 ENCOUNTER — Other Ambulatory Visit: Payer: Self-pay

## 2022-05-15 DIAGNOSIS — Z85850 Personal history of malignant neoplasm of thyroid: Secondary | ICD-10-CM | POA: Diagnosis not present

## 2022-05-15 DIAGNOSIS — N200 Calculus of kidney: Secondary | ICD-10-CM | POA: Insufficient documentation

## 2022-05-15 DIAGNOSIS — R8271 Bacteriuria: Secondary | ICD-10-CM

## 2022-05-15 DIAGNOSIS — R1033 Periumbilical pain: Secondary | ICD-10-CM | POA: Diagnosis present

## 2022-05-15 LAB — COMPREHENSIVE METABOLIC PANEL
ALT: 21 U/L (ref 0–44)
AST: 27 U/L (ref 15–41)
Albumin: 3.8 g/dL (ref 3.5–5.0)
Alkaline Phosphatase: 74 U/L (ref 38–126)
Anion gap: 7 (ref 5–15)
BUN: 10 mg/dL (ref 6–20)
CO2: 25 mmol/L (ref 22–32)
Calcium: 8.5 mg/dL — ABNORMAL LOW (ref 8.9–10.3)
Chloride: 107 mmol/L (ref 98–111)
Creatinine, Ser: 0.73 mg/dL (ref 0.44–1.00)
GFR, Estimated: >60 mL/min (ref >60)
Glucose, Bld: 114 mg/dL — ABNORMAL HIGH (ref 70–99)
Potassium: 4.1 mmol/L (ref 3.5–5.1)
Sodium: 139 mmol/L (ref 135–145)
Total Bilirubin: 0.7 mg/dL (ref 0.3–1.2)
Total Protein: 7.9 g/dL (ref 6.5–8.1)

## 2022-05-15 LAB — LIPASE, BLOOD: Lipase: 31 U/L (ref 11–51)

## 2022-05-15 LAB — URINALYSIS, ROUTINE W REFLEX MICROSCOPIC
Bilirubin Urine: NEGATIVE
Glucose, UA: NEGATIVE mg/dL
Ketones, ur: NEGATIVE mg/dL
Nitrite: NEGATIVE
Protein, ur: NEGATIVE mg/dL
Specific Gravity, Urine: 1.014 (ref 1.005–1.030)
pH: 5 (ref 5.0–8.0)

## 2022-05-15 LAB — CBC
HCT: 41.1 % (ref 36.0–46.0)
Hemoglobin: 13.1 g/dL (ref 12.0–15.0)
MCH: 28.1 pg (ref 26.0–34.0)
MCHC: 31.9 g/dL (ref 30.0–36.0)
MCV: 88 fL (ref 80.0–100.0)
Platelets: 260 10*3/uL (ref 150–400)
RBC: 4.67 MIL/uL (ref 3.87–5.11)
RDW: 14.4 % (ref 11.5–15.5)
WBC: 7.7 10*3/uL (ref 4.0–10.5)
nRBC: 0 % (ref 0.0–0.2)

## 2022-05-15 LAB — POC URINE PREG, ED: Preg Test, Ur: NEGATIVE

## 2022-05-15 MED ORDER — ONDANSETRON HCL 4 MG/2ML IJ SOLN
4.0000 mg | Freq: Once | INTRAMUSCULAR | Status: AC
  Administered 2022-05-15: 4 mg via INTRAVENOUS
  Filled 2022-05-15: qty 2

## 2022-05-15 MED ORDER — FLUCONAZOLE 150 MG PO TABS: 150.0000 mg | ORAL_TABLET | Freq: Every day | ORAL | 0 refills | Status: DC

## 2022-05-15 MED ORDER — IOHEXOL 300 MG/ML  SOLN
100.0000 mL | Freq: Once | INTRAMUSCULAR | Status: AC | PRN
  Administered 2022-05-15: 100 mL via INTRAVENOUS

## 2022-05-15 MED ORDER — CEPHALEXIN 500 MG PO CAPS: 500.0000 mg | ORAL_CAPSULE | Freq: Two times a day (BID) | ORAL | 0 refills | Status: AC

## 2022-05-15 MED ORDER — SODIUM CHLORIDE 0.9 % IV SOLN: 1.0000 g | Freq: Once | INTRAVENOUS | Status: DC

## 2022-05-15 MED ORDER — TAMSULOSIN HCL 0.4 MG PO CAPS: 0.4000 mg | ORAL_CAPSULE | Freq: Every day | ORAL | 0 refills | Status: AC

## 2022-05-15 MED ORDER — CEPHALEXIN 500 MG PO CAPS
500.0000 mg | ORAL_CAPSULE | Freq: Once | ORAL | Status: AC
  Administered 2022-05-15: 500 mg via ORAL
  Filled 2022-05-15: qty 1

## 2022-05-15 MED ORDER — KETOROLAC TROMETHAMINE 15 MG/ML IJ SOLN
15.0000 mg | Freq: Once | INTRAMUSCULAR | Status: AC
  Administered 2022-05-15: 15 mg via INTRAVENOUS
  Filled 2022-05-15: qty 1

## 2022-05-15 NOTE — ED Provider Notes (Signed)
[facility] Provider Note    Event Date/Time   First MD Initiated Contact with Patient 05/15/22 1030     (approximate)   History   Abdominal Pain   HPI  Linda Welch is a 41 y.o. female   Past medical history of thyroid cancer with thyroidectomy, CKD, GERD, kidney stones, elevated BMI, OSA on CPAP, migraine, presents to the emergency department with periumbilical sharp pain acute onset this morning without any exacerbating factors.  Lasted for 45 minutes and then began to subside gradually without intervention.   Is not had any injuries, and denies Neri symptoms, vaginal bleeding or discharge, changes in bowel movement patterns.  She had a colonoscopy 2 days ago with a couple polyps removed, has diarrhea at baseline this is unchanged.  No rectal bleeding.  No fever or chills.  No nausea or vomiting.    History was obtained via the patient.  She is a friend at bedside who is her ex-husband who acts as an independent historian who corroborates information given above.      Physical Exam   Triage Vital Signs: ED Triage Vitals  Enc Vitals Group     BP 05/15/22 1024 (!) 141/94     Pulse Rate 05/15/22 1023 77     Resp 05/15/22 1023 20     Temp 05/15/22 1023 97.9 F (36.6 C)     Temp Source 05/15/22 1023 Oral     SpO2 05/15/22 1023 98 %     Weight 05/15/22 1026 (!) 340 lb (154.2 kg)     Height 05/15/22 1026 '5\' 2"'$  (1.575 m)     Head Circumference --      Peak Flow --      Pain Score 05/15/22 1023 8     Pain Loc --      Pain Edu? --      Excl. in [place]? --     Most recent vital signs: Vitals:   05/15/22 1023 05/15/22 1024  BP:  (!) 141/94  Pulse: 77   Resp: 20   Temp: 97.9 F (36.6 C)   SpO2: 98%     General: Awake, no distress.  CV:  Good peripheral perfusion.  Resp:  Normal effort.  Abd:  No distention.  Other:  Soft and nontender to palpation no overlying skin changes, no rigidity or guarding.  She has no CVA tenderness.  Rectal exam  without any hemorrhoids fluctuance and she does have some brown stool without gross blood or melena.   ED Results / Procedures / Treatments   Labs (all labs ordered are listed, but only abnormal results are displayed) Labs Reviewed  COMPREHENSIVE METABOLIC PANEL - Abnormal; Notable for the following components:      Result Value   Glucose, Bld 114 (*)    Calcium 8.5 (*)    All other components within normal limits  URINALYSIS, ROUTINE W REFLEX MICROSCOPIC - Abnormal; Notable for the following components:   Color, Urine YELLOW (*)    APPearance HAZY (*)    Hgb urine dipstick MODERATE (*)    Leukocytes,Ua MODERATE (*)    Bacteria, UA RARE (*)    All other components within normal limits  LIPASE, BLOOD  CBC  POC URINE PREG, ED     I reviewed labs and they are notable for normal white blood cell count and H&H.    RADIOLOGY I independently reviewed and interpreted CT of the abdomen pelvis and see no obvious infectious or obstructive patterns.  PROCEDURES:  Critical Care performed: No  Procedures   MEDICATIONS ORDERED IN ED: Medications  ketorolac (TORADOL) 15 MG/ML injection 15 mg (has no administration in time range)  ondansetron (ZOFRAN) injection 4 mg (has no administration in time range)  iohexol (OMNIPAQUE) 300 MG/ML solution 100 mL (100 mLs Intravenous Contrast Given 05/15/22 1226)      IMPRESSION / MDM / ASSESSMENT AND PLAN / ED COURSE  I reviewed the triage vital signs and the nursing notes.                              Differential diagnosis includes, but is not limited to, abdominal infection, perforation, obstruction, pregnancy related, ovarian torsion, TOA, urinary tract infection   MDM: This is a patient with periumbilical sharp pain that has gradually subsided without intervention status post colonoscopy a couple days ago.  Benign abdomen on exam.  I will obtain a CT scan of the abdomen pelvis to assess for intra-abdominal emergent pathologies like  perforation obstruction or infection.  Check urinalysis and pregnancy test.  Consider pelvic etiology like torsion or TOA will first assess with CT scan and further evaluate if abnormalities on CT scan as needed  CT shows right-sided nephrolithiasis without obstruction. Patient stable and pain-free in the emergency department.  Hemodynamics appropriate and reassuring and there is some bacteria on urinalysis.  I will discharge her with antibiotics and Flomax pain control.   Patient's presentation is most consistent with acute presentation with potential threat to life or bodily function.       FINAL CLINICAL IMPRESSION(S) / ED DIAGNOSES   Final diagnoses:  Nephrolithiasis     Rx / DC Orders   ED Discharge Orders          Ordered    cephALEXin (KEFLEX) 500 MG capsule  2 times daily        05/15/22 1314    tamsulosin (FLOMAX) 0.4 MG CAPS capsule  Daily        05/15/22 1314             Note:  This document was prepared using Dragon voice recognition software and may include unintentional dictation errors.    Lucillie Garfinkel, MD 05/15/22 1315

## 2022-05-15 NOTE — Discharge Instructions (Addendum)
Take antibiotics and Flomax as prescribed.  Take acetaminophen 650 mg and ibuprofen 400 mg every 6 hours for pain.  Take with food. Thank you for choosing [place] for your health care today!  Please see your primary doctor this week for a follow up appointment.   Sometimes, in the early stages of certain disease courses it is difficult to detect in the emergency department evaluation -- so, it is important that you continue to monitor your symptoms and call your doctor right away or return to the emergency department if you develop any new or worsening symptoms.  Please go to the following website to schedule new (and existing) patient appointments:   [email or web address]  If you do not have a primary doctor try calling the following clinics to establish care:  If you have insurance:  [facility] [phone] [place] [address]   [facility]  [phone] [place] [place] [address]   If you do not have insurance:  [facility]  [phone] [address], [place] [address]   The following is another list of primary care offices in the area who are accepting new patients at this time.  Please reach out to one of them directly and let them know you would like to schedule an appointment to follow up on an Emergency Department visit, and/or to establish a new primary care provider (PCP).  There are likely other primary care clinics in the are who are accepting new patients, but this is an excellent place to start:  [place] physician: Dr Lavon Paganini [address] [place], [place] [address] [phone]  [facility] Lead Physician: Dr Steele Sizer [address], [place], [place] [address] [phone]  [place] Physician: Dr Park Liter [address] [place], [place] [address] [phone]  [facility] Lead Physician: Dr Dewaine Oats [place], [place], [place] [address] [phone]  [facility] Physician: Dr Halina Maidens [address] [place],  [address] [phone]   It was my pleasure to care for you today.   Hoover Brunette Jacelyn Grip, MD

## 2022-05-15 NOTE — ED Notes (Signed)
See triage note.  Presents with lower abd cramping and pressure.  No fever or n/v   States pain started about 1 hour ago

## 2022-05-15 NOTE — ED Triage Notes (Signed)
Patient arrives in wheelchair by POV c/o sudden severe lower abdominal cramps and pressure onset of less than an hour ago. 2 days ago had colonoscopy and had polyp removal. Denies any bleeding.

## 2022-05-16 ENCOUNTER — Telehealth: Payer: Self-pay

## 2022-05-16 NOTE — Telephone Encounter (Signed)
Transition Care Management Follow-up Telephone Call Date of discharge and from where: 05/15/2022 [facility] ED How have you been since you were released from the hospital? Much better Any questions or concerns? No  Items Reviewed: Did the pt receive and understand the discharge instructions provided? Yes  Medications obtained and verified? Yes  Other? No  Any new allergies since your discharge? No  Dietary orders reviewed? No Do you have support at home? Yes   Home Care and Equipment/Supplies: Were home health services ordered? not applicable If so, what is the name of the agency? N/a  Has the agency set up a time to come to the patient's home? not applicable Were any new equipment or medical supplies ordered?  No What is the name of the medical supply agency? N/a Were you able to get the supplies/equipment? not applicable Do you have any questions related to the use of the equipment or supplies? No  Functional Questionnaire: (I = Independent and D = Dependent) ADLs: I  Bathing/Dressing- I  Meal Prep- I  Eating- I  Maintaining continence- I  Transferring/Ambulation- I  Managing Meds- I  Follow up appointments reviewed:  PCP Hospital f/u appt confirmed? Yes  Scheduled to see Dr. Danise Mina on 05/31/2019 @ 10:30. Are transportation arrangements needed? No  If their condition worsens, is the pt aware to call PCP or go to the Emergency Dept.? Yes Was the patient provided with contact information for the PCP's office or ED? Yes Was to pt encouraged to call back with questions or concerns? Yes

## 2022-05-19 ENCOUNTER — Encounter (HOSPITAL_COMMUNITY): Payer: Self-pay | Admitting: Gastroenterology

## 2022-05-20 ENCOUNTER — Encounter: Payer: Self-pay | Admitting: Gastroenterology

## 2022-05-30 ENCOUNTER — Ambulatory Visit (INDEPENDENT_AMBULATORY_CARE_PROVIDER_SITE_OTHER): Admitting: Family Medicine

## 2022-05-30 ENCOUNTER — Encounter: Payer: Self-pay | Admitting: Family Medicine

## 2022-05-30 VITALS — BP 132/74 | HR 68 | Temp 97.4°F | Ht 62.0 in | Wt 342.5 lb

## 2022-05-30 DIAGNOSIS — R1030 Lower abdominal pain, unspecified: Secondary | ICD-10-CM

## 2022-05-30 DIAGNOSIS — F331 Major depressive disorder, recurrent, moderate: Secondary | ICD-10-CM | POA: Diagnosis not present

## 2022-05-30 DIAGNOSIS — K529 Noninfective gastroenteritis and colitis, unspecified: Secondary | ICD-10-CM | POA: Diagnosis not present

## 2022-05-30 DIAGNOSIS — N281 Cyst of kidney, acquired: Secondary | ICD-10-CM | POA: Diagnosis not present

## 2022-05-30 MED ORDER — BUPROPION HCL ER (XL) 300 MG PO TB24: 300.0000 mg | ORAL_TABLET | Freq: Every day | ORAL | 1 refills | Status: DC

## 2022-05-30 NOTE — Assessment & Plan Note (Signed)
Notes worsening irritability, desires trial of increased wellbutrin - will increase to '300mg'$  XL daily. Rec am dosing, reviewed BB interaction.  Has not tolerated SSRI/SNRI in the past.

## 2022-05-30 NOTE — Progress Notes (Signed)
Patient ID: Linda Welch, female    DOB: 04-Feb-1981, 42 y.o.   MRN: [id number]  This visit was conducted in person.  BP 132/74   Pulse 68   Temp (!) 97.4 F (36.3 C) (Temporal)   Ht '5\' 2"'$  (1.575 m)   Wt (!) 342 lb 8 oz (155.4 kg)   LMP 05/25/2022   SpO2 98%   BMI 62.64 kg/m    CC: ER f/u visit  Subjective:   HPI: Linda Welch is a 42 y.o. female presenting on 05/30/2022 for Hospitalization Follow-up (Admitted on 05/13/22 at [facility], dx diarrhea of presumed infectious origin; epigastric abd pain; dysphagia; GERD. Then pt seen on 05/15/22 at [facility] ED, dx nephrolithiasis; bacteriuria. )   Recent colonoscopy/endoscopy followed by epigastric abdominal pain - evaluated at ER, found to have R sided kidney stone without obstruction by CT scan. UA showed moderate LE and blood, microscopy reassuring, UCx not sent.  Hospital records reviewed. Med rec performed. She was discharged with keflex '500mg'$  BID 7d course (+ diflucan) and flomax.  Did not take flomax.   She actually had intense lower abdomina/pelvic pain after she lifted her husband to prepare him for dialysis.  Had period 1 week later - noted heavier bleeding and cramping with this cycle. LMP 05/25/2022.  GYN - Dr Kenton Kingfisher - needs to establish with new gynecologist.   CT showed persistent R upper pole kidney cyst - rec renal US to further evaluate this.   Home health not needed Other follow up appointments scheduled: none  COLONOSCOPY 04/2022 - HP, minimal sigmoid diverticulosis, int hem [facility]) ESOPHAGOGASTRODUODENOSCOPY 04/2022 - mild chronic gastritis on biopsy s/p empiric esophageal dilation [facility])  She continues wellbutrin XL '150mg'$ . She stopped sertraline 11/2021 with some improvement in diarrhea. Notes increased irritability. Desires trial increased wellbutrin. No h/o seizures.  ______________________________________________________________________ Hospital admission: none - ER visit only Hospital discharge:  TCM f/u phone  call: 05/16/222 performed      Relevant past medical, surgical, family and social history reviewed and updated as indicated. Interim medical history since our last visit reviewed. Allergies and medications reviewed and updated. Outpatient Medications Prior to Visit  Medication Sig Dispense Refill   Cholecalciferol (VITAMIN D3) 5000 units TABS Take 5,000 Units by mouth daily.     cyclobenzaprine (FLEXERIL) 10 MG tablet Take 0.5-1 tablets (5-10 mg total) by mouth 2 (two) times daily as needed (tension headache - with sedation precautions). 30 tablet 0   fluticasone (FLONASE) 50 MCG/ACT nasal spray Place 2 sprays into both nostrils daily. 48 g 3   levocetirizine (XYZAL) 5 MG tablet Take 5 mg by mouth every evening.     metFORMIN (GLUCOPHAGE-XR) 750 MG 24 hr tablet Take 1 tablet (750 mg total) by mouth at bedtime. 90 tablet 3   metoprolol succinate (TOPROL-XL) 100 MG 24 hr tablet Take 1 tablet (100 mg total) by mouth daily. Take with or immediately following a meal. 90 tablet 3   montelukast (SINGULAIR) 10 MG tablet Take 1 tablet (10 mg total) by mouth daily. 90 tablet 3   olopatadine (PATADAY) 0.1 % ophthalmic solution Place 1 drop into both eyes daily.     omeprazole (PRILOSEC) 40 MG capsule Take 1 capsule (40 mg total) by mouth daily as needed. (Patient taking differently: Take 40 mg by mouth daily.) 30 capsule 6   Probiotic Product (PROBIOTIC PO) Take 1 tablet by mouth daily. Seed Probiotic     rosuvastatin (CRESTOR) 10 MG tablet Take 1 tablet (10 mg  total) by mouth daily. 90 tablet 3   SYNTHROID 200 MCG tablet Take 1 tablet (200 mcg total) by mouth daily before breakfast. 90 tablet 3   tamsulosin (FLOMAX) 0.4 MG CAPS capsule Take 1 capsule (0.4 mg total) by mouth daily. 30 capsule 0   buPROPion (WELLBUTRIN XL) 150 MG 24 hr tablet Take 1 tablet (150 mg total) by mouth daily. 90 tablet 3   fluconazole (DIFLUCAN) 150 MG tablet Take 1 tablet (150 mg total) by mouth daily. 1 tablet 0   ondansetron  (ZOFRAN) 4 MG tablet Take 1 tablet (4 mg total) by mouth every 8 (eight) hours as needed for nausea or vomiting. 4 tablet 0   No facility-administered medications prior to visit.     Per HPI unless specifically indicated in ROS section below Review of Systems  Objective:  BP 132/74   Pulse 68   Temp (!) 97.4 F (36.3 C) (Temporal)   Ht '5\' 2"'$  (1.575 m)   Wt (!) 342 lb 8 oz (155.4 kg)   LMP 05/25/2022   SpO2 98%   BMI 62.64 kg/m   Wt Readings from Last 3 Encounters:  05/30/22 (!) 342 lb 8 oz (155.4 kg)  05/15/22 (!) 340 lb (154.2 kg)  05/13/22 (!) 340 lb (154.2 kg)      Physical Exam Vitals and nursing note reviewed.  Constitutional:      Appearance: Normal appearance. She is obese. She is not ill-appearing.  HENT:     Mouth/Throat:     Mouth: Mucous membranes are moist.     Pharynx: Oropharynx is clear. No oropharyngeal exudate or posterior oropharyngeal erythema.  Eyes:     Extraocular Movements: Extraocular movements intact.     Conjunctiva/sclera: Conjunctivae normal.     Pupils: Pupils are equal, round, and reactive to light.  Cardiovascular:     Rate and Rhythm: Normal rate and regular rhythm.     Pulses: Normal pulses.     Heart sounds: Normal heart sounds. No murmur heard. Pulmonary:     Effort: Pulmonary effort is normal. No respiratory distress.     Breath sounds: Normal breath sounds. No wheezing, rhonchi or rales.  Abdominal:     General: Bowel sounds are normal. There is no distension.     Palpations: Abdomen is soft. There is no mass.     Tenderness: There is no abdominal tenderness. There is no guarding or rebound.     Hernia: No hernia is present.  Musculoskeletal:     Right lower leg: No edema.     Left lower leg: No edema.  Skin:    General: Skin is warm and dry.  Neurological:     Mental Status: She is alert.  Psychiatric:        Mood and Affect: Mood normal.        Behavior: Behavior normal.       Results for orders placed or performed  during the hospital encounter of 05/15/22  Lipase, blood  Result Value Ref Range   Lipase 31 11 - 51 U/L  Comprehensive metabolic panel  Result Value Ref Range   Sodium 139 135 - 145 mmol/L   Potassium 4.1 3.5 - 5.1 mmol/L   Chloride 107 98 - 111 mmol/L   CO2 25 22 - 32 mmol/L   Glucose, Bld 114 (H) 70 - 99 mg/dL   BUN 10 6 - 20 mg/dL   Creatinine, Ser 0.73 0.44 - 1.00 mg/dL   Calcium 8.5 (L) 8.9 - 10.3  mg/dL   Total Protein 7.9 6.5 - 8.1 g/dL   Albumin 3.8 3.5 - 5.0 g/dL   AST 27 15 - 41 U/L   ALT 21 0 - 44 U/L   Alkaline Phosphatase 74 38 - 126 U/L   Total Bilirubin 0.7 0.3 - 1.2 mg/dL   GFR, Estimated >60 >60 mL/min   Anion gap 7 5 - 15  CBC  Result Value Ref Range   WBC 7.7 4.0 - 10.5 K/uL   RBC 4.67 3.87 - 5.11 MIL/uL   Hemoglobin 13.1 12.0 - 15.0 g/dL   HCT 41.1 36.0 - 46.0 %   MCV 88.0 80.0 - 100.0 fL   MCH 28.1 26.0 - 34.0 pg   MCHC 31.9 30.0 - 36.0 g/dL   RDW 14.4 11.5 - 15.5 %   Platelets 260 150 - 400 K/uL   nRBC 0.0 0.0 - 0.2 %  Urinalysis, Routine w reflex microscopic  Result Value Ref Range   Color, Urine YELLOW (A) YELLOW   APPearance HAZY (A) CLEAR   Specific Gravity, Urine 1.014 1.005 - 1.030   pH 5.0 5.0 - 8.0   Glucose, UA NEGATIVE NEGATIVE mg/dL   Hgb urine dipstick MODERATE (A) NEGATIVE   Bilirubin Urine NEGATIVE NEGATIVE   Ketones, ur NEGATIVE NEGATIVE mg/dL   Protein, ur NEGATIVE NEGATIVE mg/dL   Nitrite NEGATIVE NEGATIVE   Leukocytes,Ua MODERATE (A) NEGATIVE   RBC / HPF 0-5 0 - 5 RBC/hpf   WBC, UA 0-5 0 - 5 WBC/hpf   Bacteria, UA RARE (A) NONE SEEN   Squamous Epithelial / HPF 0-5 0 - 5   Mucus PRESENT   POC urine preg, ED  Result Value Ref Range   Preg Test, Ur NEGATIVE NEGATIVE   CT ABDOMEN AND PELVIS WITH CONTRAST IMPRESSION 05/15/2022: Hepatic steatosis.  Nonobstructive right nephrolithiasis.  2.4 cm slightly hypodense area is seen in upper pole of right kidney which corresponds in location to cyst seen on prior exams of 2006.  However, it currently demonstrates average Hounsfield measurement of 40. Renal ultrasound is recommended evaluate for possible neoplasm or hyperdense cyst.  Assessment & Plan:   Problem List Items Addressed This Visit     MDD (major depressive disorder), recurrent episode, moderate ([place])    Notes worsening irritability, desires trial of increased wellbutrin - will increase to '300mg'$  XL daily. Rec am dosing, reviewed BB interaction.  Has not tolerated SSRI/SNRI in the past.      Relevant Medications   buPROPion (WELLBUTRIN XL) 300 MG 24 hr tablet   Chronic diarrhea    This is improved off sertraline.  Reassuring EGD/colonoscopy.       Lower abdominal pain - Primary    Episode of intense lower abdominal pain a few days after colonoscopy/EGD, occurred right after lifting husband to prepare him for dialysis, with subsequent abnormal menstrual cycle with heavier bleeding and more cramping than usual. Incidental finding of nonobstructive right kidney stone - doubt kidney stone caused pain.  Anticipate either MSK cause like abd wall strain or GYN cause associated with heavier period. CT scan largely reassuring. Symptoms have since resolved. Will continue to monitor. See below for R kidney cyst plan.       Acquired renal cyst of right kidney    H/o R kidney cyst present since at least 2006.  On latest CT HU change - will order renal US for further evaluation.  Pt agrees with plan.       Relevant Orders   US  Renal     Meds ordered this encounter  Medications   buPROPion (WELLBUTRIN XL) 300 MG 24 hr tablet    Sig: Take 1 tablet (300 mg total) by mouth daily.    Dispense:  90 tablet    Refill:  1    Note new dose   Orders Placed This Encounter  Procedures   US Renal    Standing Status:   Future    Standing Expiration Date:   05/31/2023    Order Specific Question:   Reason for Exam (SYMPTOM  OR DIAGNOSIS REQUIRED)    Answer:   eval R renal cyst    Order Specific Question:    Preferred imaging location?    Answer:   [id number]     Patient Instructions  Increase wellbutrin to '300mg'$  XL daily. It can increase metoprolol effect.  ?abdominal strain vs cycle related recent pain episode. Glad it's better.  We will order renal ultrasound for further evaluation of upper right kidney cyst.  Good to see you today Return in April previously scheduled appointment.   Follow up plan: Return if symptoms worsen or fail to improve.  Ria Bush, MD

## 2022-05-30 NOTE — Assessment & Plan Note (Signed)
H/o R kidney cyst present since at least 2006.  On latest CT HU change - will order renal US for further evaluation.  Pt agrees with plan.

## 2022-05-30 NOTE — Assessment & Plan Note (Signed)
This is improved off sertraline.  Reassuring EGD/colonoscopy.

## 2022-05-30 NOTE — Assessment & Plan Note (Addendum)
Episode of intense lower abdominal pain a few days after colonoscopy/EGD, occurred right after lifting husband to prepare him for dialysis, with subsequent abnormal menstrual cycle with heavier bleeding and more cramping than usual. Incidental finding of nonobstructive right kidney stone - doubt kidney stone caused pain.  Anticipate either MSK cause like abd wall strain or GYN cause associated with heavier period. CT scan largely reassuring. Symptoms have since resolved. Will continue to monitor. See below for R kidney cyst plan.

## 2022-05-30 NOTE — Patient Instructions (Addendum)
Increase wellbutrin to '300mg'$  XL daily. It can increase metoprolol effect.  ?abdominal strain vs cycle related recent pain episode. Glad it's better.  We will order renal ultrasound for further evaluation of upper right kidney cyst.  Good to see you today Return in April previously scheduled appointment.

## 2022-06-17 ENCOUNTER — Ambulatory Visit
Admission: RE | Admit: 2022-06-17 | Discharge: 2022-06-17 | Disposition: A | Discharge status: Home / Self Care | Source: Ambulatory Visit | Attending: Family Medicine | Admitting: Family Medicine

## 2022-06-17 DIAGNOSIS — N281 Cyst of kidney, acquired: Secondary | ICD-10-CM

## 2022-06-18 ENCOUNTER — Other Ambulatory Visit: Payer: Self-pay | Admitting: Family Medicine

## 2022-06-18 DIAGNOSIS — N2889 Other specified disorders of kidney and ureter: Secondary | ICD-10-CM

## 2022-06-19 ENCOUNTER — Encounter: Payer: Self-pay | Admitting: *Deleted

## 2022-07-08 ENCOUNTER — Encounter: Payer: Self-pay | Admitting: Family Medicine

## 2022-07-08 MED ORDER — METFORMIN HCL ER 750 MG PO TB24: 750.0000 mg | ORAL_TABLET | Freq: Every day | ORAL | 0 refills | Status: DC

## 2022-07-08 NOTE — Telephone Encounter (Signed)
E-scribed refill 

## 2022-07-13 ENCOUNTER — Ambulatory Visit
Admission: RE | Admit: 2022-07-13 | Discharge: 2022-07-13 | Disposition: A | Discharge status: Home / Self Care | Source: Ambulatory Visit | Attending: Family Medicine | Admitting: Family Medicine

## 2022-07-13 DIAGNOSIS — N2889 Other specified disorders of kidney and ureter: Secondary | ICD-10-CM

## 2022-07-13 MED ORDER — GADOPICLENOL 0.5 MMOL/ML IV SOLN
10.0000 mL | Freq: Once | INTRAVENOUS | Status: AC | PRN
  Administered 2022-07-13: 10 mL via INTRAVENOUS

## 2022-07-14 ENCOUNTER — Encounter: Payer: Self-pay | Admitting: Family Medicine

## 2022-08-03 ENCOUNTER — Telehealth: Admitting: Family Medicine

## 2022-08-03 DIAGNOSIS — U071 COVID-19: Secondary | ICD-10-CM | POA: Diagnosis not present

## 2022-08-03 MED ORDER — PROMETHAZINE-DM 6.25-15 MG/5ML PO SYRP: 5.0000 mL | ORAL_SOLUTION | Freq: Four times a day (QID) | ORAL | 0 refills | Status: DC | PRN

## 2022-08-03 MED ORDER — NIRMATRELVIR/RITONAVIR (PAXLOVID)TABLET: 3.0000 | ORAL_TABLET | Freq: Two times a day (BID) | ORAL | 0 refills | Status: DC

## 2022-08-03 MED ORDER — NIRMATRELVIR/RITONAVIR (PAXLOVID)TABLET: 3.0000 | ORAL_TABLET | Freq: Two times a day (BID) | ORAL | 0 refills | Status: AC

## 2022-08-03 NOTE — Progress Notes (Signed)
Virtual Visit Consent   Linda Welch, you are scheduled for a virtual visit with a [facility] provider today. Just as with appointments in the office, your consent must be obtained to participate. Your consent will be active for this visit and any virtual visit you may have with one of our providers in the next 365 days. If you have a MyChart account, a copy of this consent can be sent to you electronically.  As this is a virtual visit, video technology does not allow for your provider to perform a traditional examination. This may limit your provider's ability to fully assess your condition. If your provider identifies any concerns that need to be evaluated in person or the need to arrange testing (such as labs, EKG, etc.), we will make arrangements to do so. Although advances in technology are sophisticated, we cannot ensure that it will always work on either your end or our end. If the connection with a video visit is poor, the visit may have to be switched to a telephone visit. With either a video or telephone visit, we are not always able to ensure that we have a secure connection.  By engaging in this virtual visit, you consent to the provision of healthcare and authorize for your insurance to be billed (if applicable) for the services provided during this visit. Depending on your insurance coverage, you may receive a charge related to this service.  I need to obtain your verbal consent now. Are you willing to proceed with your visit today? Linda Welch has provided verbal consent on 08/03/2022 for a virtual visit (video or telephone). Linda Nims, FNP  Date: 08/03/2022 11:02 AM  Virtual Visit via Video Note   I, Linda Welch, connected with  Linda Welch  ([id number], [birth year]) on 08/03/22 at 11:00 AM EDT by a video-enabled telemedicine application and verified that I am speaking with the correct person using two identifiers.  Location: Patient: Virtual Visit Location Patient:  Home Provider: Virtual Visit Location Provider: Home Office   I discussed the limitations of evaluation and management by telemedicine and the availability of in person appointments. The patient expressed understanding and agreed to proceed.    History of Present Illness: Linda Welch is a 42 y.o.  who identifies as a female who was assigned female at birth, and is being seen today for cough, headaches, runny nose with positive covid test at home. She had a severe case in September and took paxlovid with success. Sx started 4 days ago. She has comorbidities. Marland Kitchen  HPI: HPI  Problems:  Patient Active Problem List   Diagnosis Date Noted   Lower abdominal pain 05/30/2022   Acquired renal cyst of right kidney 05/30/2022   Disturbance of skin sensation 03/19/2022   Iron deficiency 09/18/2021   Chronic diarrhea 05/03/2021   Family history of premature CAD 08/01/2020   Fatty liver disease, nonalcoholic [id number]   Chronic epigastric pain 08/08/2019   Jaw pain 10/08/2018   Prediabetes 04/07/2018   Stress due to illness of family member 04/07/2018   TTH (tension-type headache) 07/13/2017   Encounter for general adult medical examination with abnormal findings 01/06/2017   Hyperlipidemia 01/06/2017   Vitamin D deficiency 11/06/2015   BMI 60.0-69.9, adult ([place]) 11/05/2015   Chronic allergic rhinitis 11/05/2015   Postsurgical hypothyroidism 06/25/2015   History of gestational diabetes 04/23/2015   Dysfunction of eustachian tube 11/06/2014   H/O thyroidectomy 11/06/2014   Hematuria, microscopic 11/06/2014   OSA on CPAP 11/06/2014  Posterior left knee pain 11/06/2014   PCOS (polycystic ovarian syndrome) 07/25/2013   MDD (major depressive disorder), recurrent episode, moderate ([place]) 03/03/2013   Hemochromatosis carrier 08/21/2012   Migraine 02/18/2010   Dyssomnia 03/10/2008    Allergies:  Allergies  Allergen Reactions   Tioconazole Itching, Swelling and Other (See Comments)     Burning (severe)   Tape Hives and Swelling    Adhesive tapes   Buspar [Buspirone]     Worsening mood   Medications:  Current Outpatient Medications:    nirmatrelvir/ritonavir (PAXLOVID) 20 x 150 MG & 10 x '100MG'$  TABS, Take 3 tablets by mouth 2 (two) times daily for 5 days. (Take nirmatrelvir 150 mg two tablets twice daily for 5 days and ritonavir 100 mg one tablet twice daily for 5 days) Patient GFR is >60, Disp: 30 tablet, Rfl: 0   promethazine-dextromethorphan (PROMETHAZINE-DM) 6.25-15 MG/5ML syrup, Take 5 mLs by mouth 4 (four) times daily as needed for cough., Disp: 118 mL, Rfl: 0   buPROPion (WELLBUTRIN XL) 300 MG 24 hr tablet, Take 1 tablet (300 mg total) by mouth daily., Disp: 90 tablet, Rfl: 1   Cholecalciferol (VITAMIN D3) 5000 units TABS, Take 5,000 Units by mouth daily., Disp: , Rfl:    cyclobenzaprine (FLEXERIL) 10 MG tablet, Take 0.5-1 tablets (5-10 mg total) by mouth 2 (two) times daily as needed (tension headache - with sedation precautions)., Disp: 30 tablet, Rfl: 0   fluticasone (FLONASE) 50 MCG/ACT nasal spray, Place 2 sprays into both nostrils daily., Disp: 48 g, Rfl: 3   levocetirizine (XYZAL) 5 MG tablet, Take 5 mg by mouth every evening., Disp: , Rfl:    metFORMIN (GLUCOPHAGE-XR) 750 MG 24 hr tablet, Take 1 tablet (750 mg total) by mouth at bedtime., Disp: 90 tablet, Rfl: 0   metoprolol succinate (TOPROL-XL) 100 MG 24 hr tablet, Take 1 tablet (100 mg total) by mouth daily. Take with or immediately following a meal., Disp: 90 tablet, Rfl: 3   montelukast (SINGULAIR) 10 MG tablet, Take 1 tablet (10 mg total) by mouth daily., Disp: 90 tablet, Rfl: 3   olopatadine (PATADAY) 0.1 % ophthalmic solution, Place 1 drop into both eyes daily., Disp: , Rfl:    omeprazole (PRILOSEC) 40 MG capsule, Take 1 capsule (40 mg total) by mouth daily as needed. (Patient taking differently: Take 40 mg by mouth daily.), Disp: 30 capsule, Rfl: 6   Probiotic Product (PROBIOTIC PO), Take 1 tablet by mouth  daily. Seed Probiotic, Disp: , Rfl:    rosuvastatin (CRESTOR) 10 MG tablet, Take 1 tablet (10 mg total) by mouth daily., Disp: 90 tablet, Rfl: 3   SYNTHROID 200 MCG tablet, Take 1 tablet (200 mcg total) by mouth daily before breakfast., Disp: 90 tablet, Rfl: 3  Observations/Objective: Patient is well-developed, well-nourished in no acute distress.  Resting comfortably  at home.  Head is normocephalic, atraumatic.  No labored breathing.  Speech is clear and coherent with logical content.  Patient is alert and oriented at baseline.    Assessment and Plan: 1. COVID-19  Increase fluids, humidifier at night, tylenol or ibuprofen, quarantine discussed, MVI, UC if sx worsen.   Follow Up Instructions: I discussed the assessment and treatment plan with the patient. The patient was provided an opportunity to ask questions and all were answered. The patient agreed with the plan and demonstrated an understanding of the instructions.  A copy of instructions were sent to the patient via MyChart unless otherwise noted below.     The patient  was advised to call back or seek an in-person evaluation if the symptoms worsen or if the condition fails to improve as anticipated.  Time:  I spent 10 minutes with the patient via telehealth technology discussing the above problems/concerns.    Linda Nims, FNP

## 2022-08-03 NOTE — Addendum Note (Signed)
Addended by: Dellia Nims on: 08/03/2022 03:44 PM   Modules accepted: Orders

## 2022-08-03 NOTE — Patient Instructions (Signed)
COVID-19 COVID-19 is an infection caused by a virus called SARS-CoV-2. Most people who get COVID-19 have mild to moderate symptoms. Some have little to no symptoms. In others, the virus may cause a severe infection. What are the causes? COVID-19 is caused by a coronavirus. The virus may be in the air as droplets or as tiny specks of fluid (aerosols). It may also be on surfaces. You may catch the virus if you: Breathe in droplets when a person with COVID-19 breathes, speaks, sings, coughs, or sneezes. Touch something that has the virus on it and then touch your mouth, nose, or eyes. What increases the risk? Risk for infection: You are more likely to get COVID-19 if: You are within 6 ft (1.8 m) of a person who has COVID-19 for 15 minutes or longer. You provide care to a person who has COVID-19. You are in close contact with others. This includes hugging, kissing, or sharing utensils. Risk for serious illness caused by COVID-19: You are more likely to get very ill from COVID-19 if: You have cancer. You have a long-term (chronic) disease. This may be: A chronic lung disease, such as pulmonary embolism, chronic obstructive pulmonary disease (COPD), or cystic fibrosis. A disease that affects your body's defense system (immune system). If you have a weak immune system, you are said to be immunocompromised. A serious heart condition, such as heart failure, coronary artery disease, or cardiomyopathy. Diabetes. Chronic kidney disease. A liver disease, such as cirrhosis, nonalcoholic fatty liver disease, alcoholic liver disease, or autoimmune hepatitis. You are obese. You are pregnant or were just pregnant. You have sickle cell disease. What are the signs or symptoms? Symptoms of COVID-19 can range from mild to severe. They may appear any time from 2 to 14 days after you are exposed. They include: Fever or chills. Shortness of breath or trouble breathing. Feeling tired. Headaches, body aches, or  muscle aches. A runny or stuffy nose. Sneezing, coughing, or a sore throat. New loss of taste or smell. You may also have stomach problems, such as nausea, vomiting, or diarrhea. In some cases, you may not have any symptoms. How is this diagnosed? COVID-19 may be diagnosed by testing a sample to check for the virus. The most common tests are the PCR test and the antigen test. Tests may be done in the lab or at home. They include: Using a swab to take a sample of fluid from your nose. Testing a sample of saliva from your mouth. Testing a sample of mucus from your lungs (sputum). How is this treated? Treatment for COVID-19 depends on how severe your condition is. Mild symptoms can be treated at home. You should rest, drink fluids, and take over-the-counter medicine. If you have symptoms and risk factors, you may be prescribed a medicine that fights viruses (antiviral). Severe symptoms may be treated in a hospital intensive care unit (ICU). Treatment may include: Extra oxygen given through a tube in the nose, a face mask, or a hood. Medicines. These may include: Antivirals, such as remdesivir. Anti-inflammatories, such as corticosteroids. These help reduce inflammation. Antithrombotics. These help prevent or treat blood clots. Convalescent plasma. This helps boost your immune system. Prone positioning. This is when you are laid on your stomach to help oxygen get into your lungs. Infection control measures. If you are at risk for a more serious illness, your health care provider may prescribe two medicines to help your immune system protect you. These are called long-acting monoclonal antibodies. They are given together   every 6 months. How is this prevented? To protect yourself: Get the vaccine or vaccine series if you meet the guidelines. You can even get the vaccine while you are pregnant or making breast milk (lactating). Get an added dose of the vaccine if you are immunocompromised. This  applies if you have had an organ transplant or if you have a condition that affects your immune system. You should get the added dose 4 weeks after you got the first one. If you get an mRNA vaccine, you will need to get 3 doses. Talk to your provider about getting experimental monoclonal antibodies. This treatment can help prevent severe illness. It may be given to you if: You are immunocompromised. You cannot get the vaccine. You may not get the vaccine if you have a severe allergic reaction to it or to what it is made of. You are not fully vaccinated. You are in a place where there is COVID-19 and: You are in close contact with someone who has COVID-19. You are at high risk of being exposed. You are at risk of illness from new variants of the virus. To protect others: If you have symptoms of COVID-19, take steps to stop the virus from spreading. Stay home. Leave your house only to get medical care. Do not use public transit. Do not travel while you are sick. Wash your hands often with soap and water for at least 20 seconds. If soap and water are not available, use alcohol-based hand sanitizer. Make sure that all people in your household wash their hands well and often. Cough or sneeze into a tissue or your sleeve or elbow. Do not cough or sneeze into your hand or into the air. Where to find more information Centers for Disease Control and Prevention (CDC): cdc.gov World Health Organization (WHO): who.int Get help right away if: You have trouble breathing. You have pain or pressure in your chest. You are confused. Your lips or fingernails turn blue. You have trouble waking from sleep. Your symptoms get worse. These symptoms may be an emergency. Get help right away. Call 911. Do not wait to see if the symptoms will go away. Do not drive yourself to the hospital. This information is not intended to replace advice given to you by your health care provider. Make sure you discuss any  questions you have with your health care provider. Document Revised: 01/24/2022 Document Reviewed: 01/24/2022 Elsevier Patient Education  2023 [employer]  

## 2022-09-06 ENCOUNTER — Other Ambulatory Visit: Payer: Self-pay | Admitting: Family Medicine

## 2022-09-06 DIAGNOSIS — E611 Iron deficiency: Secondary | ICD-10-CM

## 2022-09-06 DIAGNOSIS — E559 Vitamin D deficiency, unspecified: Secondary | ICD-10-CM

## 2022-09-06 DIAGNOSIS — R7303 Prediabetes: Secondary | ICD-10-CM

## 2022-09-06 DIAGNOSIS — E785 Hyperlipidemia, unspecified: Secondary | ICD-10-CM

## 2022-09-06 DIAGNOSIS — E890 Postprocedural hypothyroidism: Secondary | ICD-10-CM

## 2022-09-12 ENCOUNTER — Other Ambulatory Visit (INDEPENDENT_AMBULATORY_CARE_PROVIDER_SITE_OTHER)

## 2022-09-12 DIAGNOSIS — E559 Vitamin D deficiency, unspecified: Secondary | ICD-10-CM | POA: Diagnosis not present

## 2022-09-12 DIAGNOSIS — E611 Iron deficiency: Secondary | ICD-10-CM

## 2022-09-12 DIAGNOSIS — E890 Postprocedural hypothyroidism: Secondary | ICD-10-CM

## 2022-09-12 DIAGNOSIS — R7303 Prediabetes: Secondary | ICD-10-CM | POA: Diagnosis not present

## 2022-09-12 DIAGNOSIS — E785 Hyperlipidemia, unspecified: Secondary | ICD-10-CM

## 2022-09-12 LAB — CBC WITH DIFFERENTIAL/PLATELET
Basophils Absolute: 0 10*3/uL (ref 0.0–0.1)
Basophils Relative: 0.3 % (ref 0.0–3.0)
Eosinophils Absolute: 0.1 10*3/uL (ref 0.0–0.7)
Eosinophils Relative: 1.2 % (ref 0.0–5.0)
HCT: 38.4 % (ref 36.0–46.0)
Hemoglobin: 12.5 g/dL (ref 12.0–15.0)
Lymphocytes Relative: 37.7 % (ref 12.0–46.0)
Lymphs Abs: 2.3 10*3/uL (ref 0.7–4.0)
MCHC: 32.5 g/dL (ref 30.0–36.0)
MCV: 84.5 fl (ref 78.0–100.0)
Monocytes Absolute: 0.4 10*3/uL (ref 0.1–1.0)
Monocytes Relative: 6.9 % (ref 3.0–12.0)
Neutro Abs: 3.3 10*3/uL (ref 1.4–7.7)
Neutrophils Relative %: 53.9 % (ref 43.0–77.0)
Platelets: 269 10*3/uL (ref 150.0–400.0)
RBC: 4.54 Mil/uL (ref 3.87–5.11)
RDW: 15.6 % — ABNORMAL HIGH (ref 11.5–15.5)
WBC: 6 10*3/uL (ref 4.0–10.5)

## 2022-09-12 LAB — COMPREHENSIVE METABOLIC PANEL
ALT: 18 U/L (ref 0–35)
AST: 20 U/L (ref 0–37)
Albumin: 4 g/dL (ref 3.5–5.2)
Alkaline Phosphatase: 88 U/L (ref 39–117)
BUN: 10 mg/dL (ref 6–23)
CO2: 28 mEq/L (ref 19–32)
Calcium: 8.5 mg/dL (ref 8.4–10.5)
Chloride: 104 mEq/L (ref 96–112)
Creatinine, Ser: 0.69 mg/dL (ref 0.40–1.20)
GFR: 106.01 mL/min (ref >60.00)
Glucose, Bld: 100 mg/dL — ABNORMAL HIGH (ref 70–99)
Potassium: 4.4 mEq/L (ref 3.5–5.1)
Sodium: 139 mEq/L (ref 135–145)
Total Bilirubin: 0.3 mg/dL (ref 0.2–1.2)
Total Protein: 6.9 g/dL (ref 6.0–8.3)

## 2022-09-12 LAB — IBC PANEL
Iron: 33 ug/dL — ABNORMAL LOW (ref 42–145)
Saturation Ratios: 7.9 % — ABNORMAL LOW (ref 20.0–50.0)
TIBC: 417.2 ug/dL (ref 250.0–450.0)
Transferrin: 298 mg/dL (ref 212.0–360.0)

## 2022-09-12 LAB — TSH: TSH: 0.04 u[IU]/mL — ABNORMAL LOW (ref 0.35–5.50)

## 2022-09-12 LAB — HEMOGLOBIN A1C: Hgb A1c MFr Bld: 5.8 % (ref 4.6–6.5)

## 2022-09-12 LAB — FERRITIN: Ferritin: 10.4 ng/mL (ref 10.0–291.0)

## 2022-09-12 LAB — LIPID PANEL
Cholesterol: 169 mg/dL (ref 0–200)
HDL: 40.5 mg/dL (ref >39.00)
LDL Cholesterol: 102 mg/dL — ABNORMAL HIGH (ref 0–99)
NonHDL: 128.05
Total CHOL/HDL Ratio: 4
Triglycerides: 128 mg/dL (ref 0.0–149.0)
VLDL: 25.6 mg/dL (ref 0.0–40.0)

## 2022-09-12 LAB — VITAMIN D 25 HYDROXY (VIT D DEFICIENCY, FRACTURES): VITD: 31.17 ng/mL (ref 30.00–100.00)

## 2022-09-14 ENCOUNTER — Encounter: Payer: Self-pay | Admitting: Family Medicine

## 2022-09-14 DIAGNOSIS — E785 Hyperlipidemia, unspecified: Secondary | ICD-10-CM

## 2022-09-14 DIAGNOSIS — F331 Major depressive disorder, recurrent, moderate: Secondary | ICD-10-CM

## 2022-09-14 DIAGNOSIS — R7303 Prediabetes: Secondary | ICD-10-CM

## 2022-09-15 MED ORDER — CYCLOBENZAPRINE HCL 10 MG PO TABS: 5.0000 mg | ORAL_TABLET | Freq: Two times a day (BID) | ORAL | 0 refills | Status: DC | PRN

## 2022-09-15 MED ORDER — METOPROLOL SUCCINATE ER 100 MG PO TB24: 100.0000 mg | ORAL_TABLET | Freq: Every day | ORAL | 0 refills | Status: DC

## 2022-09-15 MED ORDER — BUPROPION HCL ER (XL) 300 MG PO TB24: 300.0000 mg | ORAL_TABLET | Freq: Every day | ORAL | 0 refills | Status: DC

## 2022-09-15 MED ORDER — ROSUVASTATIN CALCIUM 10 MG PO TABS: 10.0000 mg | ORAL_TABLET | Freq: Every day | ORAL | 0 refills | Status: DC

## 2022-09-15 MED ORDER — OMEPRAZOLE 40 MG PO CPDR: 40.0000 mg | DELAYED_RELEASE_CAPSULE | Freq: Every day | ORAL | 0 refills | Status: DC | PRN

## 2022-09-15 MED ORDER — METFORMIN HCL ER 750 MG PO TB24: 750.0000 mg | ORAL_TABLET | Freq: Every day | ORAL | 0 refills | Status: DC

## 2022-09-15 NOTE — Telephone Encounter (Signed)
Flexeril Last rx:  09/17/21, #30 Last OV:  05/30/22, hosp f/u Next OV:  09/19/22, CPE

## 2022-09-15 NOTE — Addendum Note (Signed)
Addended by: Eustaquio Boyden on: 09/15/2022 05:20 PM   Modules accepted: Orders

## 2022-09-16 MED ORDER — BUPROPION HCL ER (XL) 300 MG PO TB24: 300.0000 mg | ORAL_TABLET | Freq: Every day | ORAL | 0 refills | Status: DC

## 2022-09-16 MED ORDER — OMEPRAZOLE 40 MG PO CPDR: 40.0000 mg | DELAYED_RELEASE_CAPSULE | Freq: Every day | ORAL | 0 refills | Status: DC | PRN

## 2022-09-16 MED ORDER — METOPROLOL SUCCINATE ER 100 MG PO TB24: 100.0000 mg | ORAL_TABLET | Freq: Every day | ORAL | 0 refills | Status: DC

## 2022-09-16 MED ORDER — METFORMIN HCL ER 750 MG PO TB24: 750.0000 mg | ORAL_TABLET | Freq: Every day | ORAL | 0 refills | Status: DC

## 2022-09-16 MED ORDER — ROSUVASTATIN CALCIUM 10 MG PO TABS: 10.0000 mg | ORAL_TABLET | Freq: Every day | ORAL | 0 refills | Status: DC

## 2022-09-16 NOTE — Telephone Encounter (Signed)
E-scribed 30-day rxs to [employer] Fyi to Dr. Reece Agar

## 2022-09-16 NOTE — Addendum Note (Signed)
Addended by: Nanci Pina on: 09/16/2022 08:19 AM   Modules accepted: Orders

## 2022-09-19 ENCOUNTER — Encounter: Payer: Self-pay | Admitting: Internal Medicine

## 2022-09-19 ENCOUNTER — Encounter: Payer: Self-pay | Admitting: Family Medicine

## 2022-09-19 ENCOUNTER — Ambulatory Visit (INDEPENDENT_AMBULATORY_CARE_PROVIDER_SITE_OTHER): Admitting: Family Medicine

## 2022-09-19 ENCOUNTER — Other Ambulatory Visit: Payer: Self-pay | Admitting: Internal Medicine

## 2022-09-19 VITALS — BP 120/68 | HR 96 | Temp 97.3°F | Ht 63.5 in | Wt 330.0 lb

## 2022-09-19 DIAGNOSIS — J309 Allergic rhinitis, unspecified: Secondary | ICD-10-CM

## 2022-09-19 DIAGNOSIS — R7303 Prediabetes: Secondary | ICD-10-CM | POA: Diagnosis not present

## 2022-09-19 DIAGNOSIS — E785 Hyperlipidemia, unspecified: Secondary | ICD-10-CM

## 2022-09-19 DIAGNOSIS — E611 Iron deficiency: Secondary | ICD-10-CM

## 2022-09-19 DIAGNOSIS — E890 Postprocedural hypothyroidism: Secondary | ICD-10-CM

## 2022-09-19 DIAGNOSIS — F331 Major depressive disorder, recurrent, moderate: Secondary | ICD-10-CM

## 2022-09-19 DIAGNOSIS — G4733 Obstructive sleep apnea (adult) (pediatric): Secondary | ICD-10-CM

## 2022-09-19 DIAGNOSIS — Z8249 Family history of ischemic heart disease and other diseases of the circulatory system: Secondary | ICD-10-CM

## 2022-09-19 DIAGNOSIS — Z0001 Encounter for general adult medical examination with abnormal findings: Secondary | ICD-10-CM

## 2022-09-19 DIAGNOSIS — Z01419 Encounter for gynecological examination (general) (routine) without abnormal findings: Secondary | ICD-10-CM

## 2022-09-19 DIAGNOSIS — E282 Polycystic ovarian syndrome: Secondary | ICD-10-CM

## 2022-09-19 DIAGNOSIS — E559 Vitamin D deficiency, unspecified: Secondary | ICD-10-CM

## 2022-09-19 DIAGNOSIS — K760 Fatty (change of) liver, not elsewhere classified: Secondary | ICD-10-CM

## 2022-09-19 DIAGNOSIS — K529 Noninfective gastroenteritis and colitis, unspecified: Secondary | ICD-10-CM

## 2022-09-19 DIAGNOSIS — Z148 Genetic carrier of other disease: Secondary | ICD-10-CM

## 2022-09-19 MED ORDER — BUPROPION HCL ER (XL) 300 MG PO TB24: 300.0000 mg | ORAL_TABLET | Freq: Every day | ORAL | 4 refills | Status: DC

## 2022-09-19 MED ORDER — METOPROLOL SUCCINATE ER 100 MG PO TB24: 100.0000 mg | ORAL_TABLET | Freq: Every day | ORAL | 4 refills | Status: DC

## 2022-09-19 MED ORDER — METFORMIN HCL ER 750 MG PO TB24: 750.0000 mg | ORAL_TABLET | Freq: Every day | ORAL | 4 refills | Status: DC

## 2022-09-19 MED ORDER — MONTELUKAST SODIUM 10 MG PO TABS: 10.0000 mg | ORAL_TABLET | Freq: Every day | ORAL | 4 refills | Status: DC

## 2022-09-19 MED ORDER — OMEPRAZOLE 40 MG PO CPDR: 40.0000 mg | DELAYED_RELEASE_CAPSULE | Freq: Every day | ORAL | 4 refills | Status: DC

## 2022-09-19 MED ORDER — ROSUVASTATIN CALCIUM 10 MG PO TABS: 10.0000 mg | ORAL_TABLET | Freq: Every day | ORAL | 4 refills | Status: DC

## 2022-09-19 NOTE — Assessment & Plan Note (Signed)
Chronic, stable 

## 2022-09-19 NOTE — Assessment & Plan Note (Signed)
Congratulated on 36 lb weight loss. She partly attributes to change in antidepressant from sertraline to wellbutrin.

## 2022-09-19 NOTE — Assessment & Plan Note (Addendum)
Severe on MRI 06/2022. Continue to encourage weight loss, good sugar and cholesterol control. Avoid alcohol. Consider Hep B vaccination series.   Fibrosis 4 Score = .75 (Low risk)        Interpretation for patients with NAFLD          <1.30       -  F0-F1 (Low risk)          1.30-2.67 -  Indeterminate           >2.67      -  F3-F4 (High risk)     Validated for ages 43-65

## 2022-09-19 NOTE — Assessment & Plan Note (Signed)
Trial oral iron QOD x 1 month with down trend in iron levels/stores.  Recheck iron shortly after.

## 2022-09-19 NOTE — Assessment & Plan Note (Signed)
Consider Lp(a) next labwork.

## 2022-09-19 NOTE — Assessment & Plan Note (Signed)
Preventative protocols reviewed and updated unless pt declined. Discussed healthy diet and lifestyle.  

## 2022-09-19 NOTE — Assessment & Plan Note (Addendum)
Low iron stores with low oral iron despite hemochromatosis carrier  Will trial oral iron QOD x 1 month, assess effect on energy levels after this, recheck iron levels in 2 months.

## 2022-09-19 NOTE — Patient Instructions (Addendum)
Call to schedule well woman exam.  Call to schedule mammogram at your convenience (summer 2024): [facility] [phone] Get back on flonase + nasal saline irrigation for allergy season.  Reach out to Dr Elvera Lennox about thyroid dosing.  Trial oral iron 325mg  (65FE) every other day for a month to see effect on fatigue as your iron levels/stores are low.  Good to see you today  Schedule lab visit in 2 months to recheck iron levels Return as needed or in 6 months for f/u visit

## 2022-09-19 NOTE — Assessment & Plan Note (Signed)
Continues CPAP therapy. 

## 2022-09-19 NOTE — Assessment & Plan Note (Signed)
Low normal readings on vitamin D 5000 IU daily.

## 2022-09-19 NOTE — Assessment & Plan Note (Signed)
Chronic, stable on crestor 10mg  daily - continue The 10-year ASCVD risk score (Arnett DK, et al., 2019) is: 1.5%   Values used to calculate the score:     Age: 42 years     Sex: Female     Is Non-Hispanic African American: No     Diabetic: Yes     Tobacco smoker: No     Systolic Blood Pressure: 120 mmHg     Is BP treated: No     HDL Cholesterol: 40.5 mg/dL     Total Cholesterol: 169 mg/dL

## 2022-09-19 NOTE — Assessment & Plan Note (Signed)
[facility]. Significant improvement off sertraline.

## 2022-09-19 NOTE — Assessment & Plan Note (Signed)
Appreciate endo care.  

## 2022-09-19 NOTE — Assessment & Plan Note (Addendum)
Continue singulair, cetirizine, flonase especially during allergy season.  Current symptoms consistent with exacerbation of allergic rhinitis vs viral sinusitis - supportive measures recommended at this time. Let us know if symptoms ongoing past 7-10 days or worsening despite supportive measures.

## 2022-09-19 NOTE — Progress Notes (Signed)
Ph: [phone]       Fax: [phone]   Patient ID: Linda Welch, female    DOB: Jul 05, 1980, 42 y.o.   MRN: [id number]  This visit was conducted in person.  BP 120/68   Pulse 96   Temp (!) 97.3 F (36.3 C) (Temporal)   Ht 5' 3.5" (1.613 m)   Wt (!) 330 lb (149.7 kg)   LMP 09/10/2022   SpO2 98%   BMI 57.54 kg/m    CC: CPE Subjective:   HPI: Linda Welch is a 42 y.o. female presenting on 09/19/2022 for Annual Exam (C/o sinus congestion, facial pain, sinus HA and postnasal drainage. )   Rpt COVID infection 07/2022 treated with Paxlovid. 1d h/o sinus pressure, headache, bilateral facial pain, PNDrainage, congestion. She continues xyzal, singulair and flonase. No fever, no cough.   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 Recent TSH low - I recommended dropping levothyroxine daily to one day a week taking 1/2 tab.  PCOS on metformin  OSA on CPAP pressure 16 mmH2O diagnosed ~16 yrs ago. This was previously followed by cardiology (last seen 2019).   Chronic diarrhea with abd discomfort - managed with pre/probiotic with benefit. Stopped sertraline with some improvement in diarrhea. Pancreatic elastase normal.  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   36 lb weight loss in the past year! She's felt the best she's felt. Notes improvement in palpitations, improvement in diarrhea since she stopped sertraline. She also stopped diet sodas, started drinking sparkling water.   Preventative: GYN - Dr Tiburcio Pea at [facility] has since left  the practice. All normal pap smears in the past. Discussed overdue for f/u - last pap 2017. LMP 09/10/2022, regularly [facility] - does breast exams at home. Trying to get Q2 yrs.  Flu shot yearly  COVID vaccine Pfizer 07/2019, 08/2019, booster 04/2020  Tdap 2013, Tdap 2014.  Prevnar-13 2015, pneumovax-23 2020 Seat belt use discussed.  Sunscreen use discussed. No changing moles on skin.  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 Interim medical history since our last visit reviewed. Allergies and medications reviewed and updated. Outpatient Medications Prior to Visit  Medication Sig Dispense Refill   Cholecalciferol (VITAMIN D3) 5000 units TABS Take 5,000 Units by mouth daily.     cyclobenzaprine (FLEXERIL) 10 MG tablet Take 0.5-1 tablets (5-10 mg total) by mouth 2 (two) times daily as needed (tension headache - with sedation precautions). 30 tablet 0   fluticasone (FLONASE) 50 MCG/ACT nasal spray Place 2 sprays into both nostrils daily. 48 g 3   levocetirizine (XYZAL) 5 MG tablet Take 5 mg by mouth every evening.     olopatadine (PATADAY) 0.1 % ophthalmic solution Place 1 drop into both eyes  daily.     Probiotic Product (PROBIOTIC PO) Take 1 tablet by mouth daily. Seed Probiotic     SYNTHROID 200 MCG tablet Take 1 tablet (200 mcg total) by mouth daily before breakfast. 90 tablet 3   buPROPion (WELLBUTRIN XL) 300 MG 24 hr tablet Take 1 tablet (300 mg total) by mouth daily. 30 tablet 0   metFORMIN (GLUCOPHAGE-XR) 750 MG 24 hr tablet Take 1 tablet (750 mg total) by mouth at bedtime. 30 tablet  0   metoprolol succinate (TOPROL-XL) 100 MG 24 hr tablet Take 1 tablet (100 mg total) by mouth daily. Take with or immediately following a meal. 30 tablet 0   montelukast (SINGULAIR) 10 MG tablet Take 1 tablet (10 mg total) by mouth daily. 90 tablet 3   omeprazole (PRILOSEC) 40 MG capsule Take 1 capsule (40 mg total) by mouth daily as needed. 30 capsule 0   rosuvastatin (CRESTOR) 10 MG tablet Take 1 tablet (10 mg total) by mouth daily. 30 tablet 0   promethazine-dextromethorphan (PROMETHAZINE-DM) 6.25-15 MG/5ML syrup Take 5 mLs by mouth 4 (four) times daily as needed for cough. 118 mL 0   No facility-administered medications prior to visit.     Per HPI unless specifically indicated in ROS section below Review of Systems  Constitutional:  Negative for activity change, appetite change, chills, fatigue, fever and unexpected weight change.  HENT:  Positive for congestion, ear pain (left), sinus pressure and sinus pain. Negative for hearing loss.   Eyes:  Negative for visual disturbance.  Respiratory:  Negative for cough, chest tightness, shortness of breath and wheezing.   Cardiovascular:  Negative for chest pain, palpitations and leg swelling.  Gastrointestinal:  Negative for abdominal distention, abdominal pain, blood in stool, constipation, diarrhea, nausea and vomiting.  Genitourinary:  Negative for difficulty urinating and hematuria.  Musculoskeletal:  Negative for arthralgias, myalgias and neck pain.  Skin:  Negative for rash.  Neurological:  Positive for dizziness and headaches (sinus). Negative for seizures and syncope.  Hematological:  Negative for adenopathy. Does not bruise/bleed easily.  Psychiatric/Behavioral:  Negative for dysphoric mood. The patient is not nervous/anxious.     Objective:  BP 120/68   Pulse 96   Temp (!) 97.3 F (36.3 C) (Temporal)   Ht 5' 3.5" (1.613 m)   Wt (!) 330 lb (149.7 kg)   LMP 09/10/2022   SpO2 98%   BMI 57.54 kg/m   Wt Readings from Last 3  Encounters:  09/19/22 (!) 330 lb (149.7 kg)  05/30/22 (!) 342 lb 8 oz (155.4 kg)  05/15/22 (!) 340 lb (154.2 kg)      Physical Exam Vitals and nursing note reviewed.  Constitutional:      Appearance: Normal appearance. She is not ill-appearing.  HENT:     Head: Normocephalic and atraumatic.     Right Ear: Tympanic membrane, ear canal and external ear normal. There is no impacted cerumen.     Left Ear: Tympanic membrane, ear canal and external ear normal. There is no impacted cerumen.     Mouth/Throat:     Mouth: Mucous membranes are moist.     Pharynx: Oropharynx is clear. No oropharyngeal exudate or posterior oropharyngeal erythema.  Eyes:     General:        Right eye: No discharge.        Left eye: No discharge.     Extraocular Movements: Extraocular movements intact.     Conjunctiva/sclera: Conjunctivae normal.     Pupils: Pupils are  equal, round, and reactive to light.  Neck:     Thyroid: No thyroid mass or thyromegaly.  Cardiovascular:     Rate and Rhythm: Normal rate and regular rhythm.     Pulses: Normal pulses.     Heart sounds: Normal heart sounds. No murmur heard. Pulmonary:     Effort: Pulmonary effort is normal. No respiratory distress.     Breath sounds: Normal breath sounds. No wheezing, rhonchi or rales.  Abdominal:     General: Bowel sounds are normal. There is no distension.     Palpations: Abdomen is soft. There is no mass.     Tenderness: There is no abdominal tenderness. There is no guarding or rebound.     Hernia: No hernia is present.  Musculoskeletal:     Cervical back: Normal range of motion and neck supple. No rigidity.     Right lower leg: No edema.     Left lower leg: No edema.  Lymphadenopathy:     Cervical: No cervical adenopathy.  Skin:    General: Skin is warm and dry.     Findings: No rash.  Neurological:     General: No focal deficit present.     Mental Status: She is alert. Mental status is at baseline.  Psychiatric:        Mood  and Affect: Mood normal.        Behavior: Behavior normal.       Results for orders placed or performed in visit on 09/12/22  Comprehensive metabolic panel  Result Value Ref Range   Sodium 139 135 - 145 mEq/L   Potassium 4.4 3.5 - 5.1 mEq/L   Chloride 104 96 - 112 mEq/L   CO2 28 19 - 32 mEq/L   Glucose, Bld 100 (H) 70 - 99 mg/dL   BUN 10 6 - 23 mg/dL   Creatinine, Ser 0.98 0.40 - 1.20 mg/dL   Total Bilirubin 0.3 0.2 - 1.2 mg/dL   Alkaline Phosphatase 88 39 - 117 U/L   AST 20 0 - 37 U/L   ALT 18 0 - 35 U/L   Total Protein 6.9 6.0 - 8.3 g/dL   Albumin 4.0 3.5 - 5.2 g/dL   GFR 119.14 >78.29 mL/min   Calcium 8.5 8.4 - 10.5 mg/dL  Lipid panel  Result Value Ref Range   Cholesterol 169 0 - 200 mg/dL   Triglycerides 562.1 0.0 - 149.0 mg/dL   HDL 30.86 >57.84 mg/dL   VLDL 69.6 0.0 - 29.5 mg/dL   LDL Cholesterol 284 (H) 0 - 99 mg/dL   Total CHOL/HDL Ratio 4    NonHDL 128.05   CBC with Differential/Platelet  Result Value Ref Range   WBC 6.0 4.0 - 10.5 K/uL   RBC 4.54 3.87 - 5.11 Mil/uL   Hemoglobin 12.5 12.0 - 15.0 g/dL   HCT 13.2 44.0 - 10.2 %   MCV 84.5 78.0 - 100.0 fl   MCHC 32.5 30.0 - 36.0 g/dL   RDW 72.5 (H) 36.6 - 44.0 %   Platelets 269.0 150.0 - 400.0 K/uL   Neutrophils Relative % 53.9 43.0 - 77.0 %   Lymphocytes Relative 37.7 12.0 - 46.0 %   Monocytes Relative 6.9 3.0 - 12.0 %   Eosinophils Relative 1.2 0.0 - 5.0 %   Basophils Relative 0.3 0.0 - 3.0 %   Neutro Abs 3.3 1.4 - 7.7 K/uL   Lymphs Abs 2.3 0.7 - 4.0 K/uL   Monocytes Absolute 0.4 0.1 - 1.0 K/uL  Eosinophils Absolute 0.1 0.0 - 0.7 K/uL   Basophils Absolute 0.0 0.0 - 0.1 K/uL  VITAMIN D 25 Hydroxy (Vit-D Deficiency, Fractures)  Result Value Ref Range   VITD 31.17 30.00 - 100.00 ng/mL  IBC panel  Result Value Ref Range   Iron 33 (L) 42 - 145 ug/dL   Transferrin 161.0 960.4 - 360.0 mg/dL   Saturation Ratios 7.9 (L) 20.0 - 50.0 %   TIBC 417.2 250.0 - 450.0 mcg/dL  Ferritin  Result Value Ref Range    Ferritin 10.4 10.0 - 291.0 ng/mL  Hemoglobin A1c  Result Value Ref Range   Hgb A1c MFr Bld 5.8 4.6 - 6.5 %  TSH  Result Value Ref Range   TSH 0.04 (L) 0.35 - 5.50 uIU/mL      09/19/2022    8:59 AM 09/17/2021    2:24 PM 07/30/2020   11:45 AM 04/26/2019    4:34 PM 04/07/2018    5:52 PM  Depression screen PHQ 2/9  Decreased Interest 1 3 2 2  0  Down, Depressed, Hopeless 1 2 2 2 1   PHQ - 2 Score 2 5 4 4 1   Altered sleeping 0 3 3 3 2   Tired, decreased energy 2 3 3 3 3   Change in appetite 0 0 0 2 1  Feeling bad or failure about yourself  2 2 1 2  0  Trouble concentrating 3 3 3 2 3   Moving slowly or fidgety/restless 0 0 0 1 1  Suicidal thoughts 0 0 0 0 0  PHQ-9 Score 9 16 14 17 11   Difficult doing work/chores Somewhat difficult Extremely dIfficult          09/19/2022    9:00 AM 09/17/2021    2:25 PM 07/30/2020   11:46 AM 04/26/2019    4:34 PM  GAD 7 : Generalized Anxiety Score  Nervous, Anxious, on Edge 1 2 2 3   Control/stop worrying 1 3 3 2   Worry too much - different things 1 2 3 2   Trouble relaxing 0 1 1 1   Restless 0 0 0 1  Easily annoyed or irritable 2 2 2 3   Afraid - awful might happen 1 3 1 3   Total GAD 7 Score 6 13 12 15   Anxiety Difficulty Somewhat difficult Somewhat difficult     Assessment & Plan:   Problem List Items Addressed This Visit     PCOS (polycystic ovarian syndrome)    Appreciate endo care.       Hemochromatosis carrier    Trial oral iron QOD x 1 month with down trend in iron levels/stores.  Recheck iron shortly after.       OSA on CPAP    Continues CPAP therapy      MDD (major depressive disorder), recurrent episode, moderate (HCC)    Stable period on wellbutrin XL 300mg  daily - continue this. Sertraline caused weight gain and diarrhea.       Relevant Medications   buPROPion (WELLBUTRIN XL) 300 MG 24 hr tablet   Postsurgical hypothyroidism    Followed by endo. Recent TSH low - I recommended she drop levothyroxine to daily with 1/2 tab  one day a week and will reach out to Dr Elvera Lennox re f/u plan.       Relevant Medications   metoprolol succinate (TOPROL-XL) 100 MG 24 hr tablet   Obesity, morbid, BMI 50 or higher (HCC)    Congratulated on 36 lb weight loss. She partly attributes to change in antidepressant from sertraline  to wellbutrin.      Relevant Medications   metFORMIN (GLUCOPHAGE-XR) 750 MG 24 hr tablet   Chronic allergic rhinitis    Continue singulair, cetirizine, flonase especially during allergy season.  Current symptoms consistent with exacerbation of allergic rhinitis vs viral sinusitis - supportive measures recommended at this time. Let us know if symptoms ongoing past 7-10 days or worsening despite supportive measures.       Relevant Medications   montelukast (SINGULAIR) 10 MG tablet   Vitamin D deficiency    Low normal readings on vitamin D 5000 IU daily.       Encounter for general adult medical examination with abnormal findings - Primary (Chronic)    Preventative protocols reviewed and updated unless pt declined. Discussed healthy diet and lifestyle.       Hyperlipidemia    Chronic, stable on crestor 10mg  daily - continue The 10-year ASCVD risk score (Arnett DK, et al., 2019) is: 1.5%   Values used to calculate the score:     Age: 54 years     Sex: Female     Is Non-Hispanic African American: No     Diabetic: Yes     Tobacco smoker: No     Systolic Blood Pressure: 120 mmHg     Is BP treated: No     HDL Cholesterol: 40.5 mg/dL     Total Cholesterol: 169 mg/dL       Relevant Medications   metoprolol succinate (TOPROL-XL) 100 MG 24 hr tablet   rosuvastatin (CRESTOR) 10 MG tablet   Prediabetes    Chronic, stable.       Relevant Medications   metFORMIN (GLUCOPHAGE-XR) 750 MG 24 hr tablet   Fatty liver disease, nonalcoholic    Severe on MRI 06/2022. Continue to encourage weight loss, good sugar and cholesterol control. Avoid alcohol. Consider Hep B vaccination series.   Fibrosis 4 Score  = .75 (Low risk)        Interpretation for patients with NAFLD          <1.30       -  F0-F1 (Low risk)          1.30-2.67 -  Indeterminate           >2.67      -  F3-F4 (High risk)     Validated for ages 43-65      Family history of premature CAD    Consider Lp(a) next labwork.       Chronic diarrhea    [facility] GI. Significant improvement off sertraline.       Iron deficiency    Low iron stores with low oral iron despite hemochromatosis carrier  Will trial oral iron QOD x 1 month, assess effect on energy levels after this, recheck iron levels in 2 months.       Relevant Orders   Ferritin   IBC panel   Other Visit Diagnoses     Well woman exam       Relevant Orders   Ambulatory referral to Gynecology        Meds ordered this encounter  Medications   buPROPion (WELLBUTRIN XL) 300 MG 24 hr tablet    Sig: Take 1 tablet (300 mg total) by mouth daily.    Dispense:  90 tablet    Refill:  4   metFORMIN (GLUCOPHAGE-XR) 750 MG 24 hr tablet    Sig: Take 1 tablet (750 mg total) by mouth at bedtime.    Dispense:  90  tablet    Refill:  4   metoprolol succinate (TOPROL-XL) 100 MG 24 hr tablet    Sig: Take 1 tablet (100 mg total) by mouth daily. Take with or immediately following a meal.    Dispense:  90 tablet    Refill:  4   montelukast (SINGULAIR) 10 MG tablet    Sig: Take 1 tablet (10 mg total) by mouth daily.    Dispense:  90 tablet    Refill:  4   omeprazole (PRILOSEC) 40 MG capsule    Sig: Take 1 capsule (40 mg total) by mouth daily.    Dispense:  90 capsule    Refill:  4   rosuvastatin (CRESTOR) 10 MG tablet    Sig: Take 1 tablet (10 mg total) by mouth daily.    Dispense:  90 tablet    Refill:  4    Orders Placed This Encounter  Procedures   Ferritin    Standing Status:   Future    Standing Expiration Date:   09/19/2023   IBC panel    Standing Status:   Future    Standing Expiration Date:   09/19/2023   Ambulatory referral to Gynecology    Referral  Priority:   Routine    Referral Type:   Consultation    Referral Reason:   Specialty Services Required    Requested Specialty:   Gynecology    Number of Visits Requested:   1    Patient Instructions  Call to schedule well woman exam.  Call to schedule mammogram at your convenience (summer 2024): [facility] [phone] Get back on flonase + nasal saline irrigation for allergy season.  Reach out to Dr Elvera Lennox about thyroid dosing.  Trial oral iron 325mg  (65FE) every other day for a month to see effect on fatigue as your iron levels/stores are low.  Good to see you today  Schedule lab visit in 2 months to recheck iron levels Return as needed or in 6 months for f/u visit   Follow up plan: Return in about 6 months (around 03/21/2023), or if symptoms worsen or fail to improve, for follow up visit.  Eustaquio Boyden, MD

## 2022-09-19 NOTE — Assessment & Plan Note (Signed)
Stable period on wellbutrin XL 300mg  daily - continue this. Sertraline caused weight gain and diarrhea.

## 2022-09-19 NOTE — Assessment & Plan Note (Signed)
Followed by endo. Recent TSH low - I recommended she drop levothyroxine to daily with 1/2 tab one day a week and will reach out to Dr Elvera Lennox re f/u plan.

## 2022-10-14 ENCOUNTER — Other Ambulatory Visit: Payer: Self-pay | Admitting: Family Medicine

## 2022-10-14 DIAGNOSIS — R7303 Prediabetes: Secondary | ICD-10-CM

## 2022-10-14 DIAGNOSIS — F331 Major depressive disorder, recurrent, moderate: Secondary | ICD-10-CM

## 2022-10-14 DIAGNOSIS — E785 Hyperlipidemia, unspecified: Secondary | ICD-10-CM

## 2022-10-14 NOTE — Telephone Encounter (Signed)
Too soon. Rxs sent on 09/19/22, #90/4 to [facility] by mail [facility].  Requests denied.

## 2022-11-19 ENCOUNTER — Other Ambulatory Visit: Payer: Self-pay

## 2022-11-25 ENCOUNTER — Encounter: Admitting: Obstetrics and Gynecology

## 2022-11-26 ENCOUNTER — Encounter: Payer: Self-pay | Admitting: Internal Medicine

## 2022-11-26 MED ORDER — SYNTHROID 200 MCG PO TABS: 200.0000 ug | ORAL_TABLET | Freq: Every day | ORAL | 1 refills | Status: DC

## 2022-11-26 NOTE — Telephone Encounter (Signed)
Please reschedule pt appt

## 2022-11-28 NOTE — Telephone Encounter (Signed)
FYI,

## 2022-12-08 ENCOUNTER — Ambulatory Visit: Admitting: Internal Medicine

## 2022-12-08 ENCOUNTER — Encounter: Payer: Self-pay | Admitting: Internal Medicine

## 2022-12-08 VITALS — BP 124/86 | HR 84 | Ht 63.5 in | Wt 325.2 lb

## 2022-12-08 DIAGNOSIS — C73 Malignant neoplasm of thyroid gland: Secondary | ICD-10-CM | POA: Diagnosis not present

## 2022-12-08 DIAGNOSIS — E890 Postprocedural hypothyroidism: Secondary | ICD-10-CM | POA: Diagnosis not present

## 2022-12-08 LAB — TSH: TSH: 0.96 u[IU]/mL (ref 0.35–5.50)

## 2022-12-08 LAB — T4, FREE: Free T4: 1.11 ng/dL (ref 0.60–1.60)

## 2022-12-08 NOTE — Patient Instructions (Addendum)
Please stop at the lab.  Please continue Synthroid 185 mcg.  .Take the thyroid hormone every day, with water, at least 30 minutes before breakfast, separated by at least 4 hours from: - acid reflux medications - calcium - iron - multivitamins  Please come back for a follow-up appointment in 1 year.

## 2022-12-08 NOTE — Progress Notes (Signed)
Patient ID: Linda Welch, female   DOB: February 24, 1981, 42 y.o.   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  Last visit 1 year ago.  Interim hx: Since last visit, she had major stress with her husband dying after a long illness (diabetes, dialysis, neck fracture). She has palpitations (on Metoprolol), no tremors, no weight gain She has weight loss (10 lbs reportedly since last OV), + chronic heat intolerance. Still has some hoarseness. Es was stretched 12/20224.  Reviewed her thyroid cancer history: - dx in in 02/2009 >> 3 cm nodule felt by pt >> seen by PCP >> referred to endo >> uptake and scan >> cold nodule >> FNA by Dr Johny Chess: benign (bad experience).  - thyroid U/S 2010: 3.1 x 2.6 x 2.2 cm complex, slightly hypoechoic, nodule, situated in the mid pole of the right thyroid lobe - Thyroid ultrasound 02/06/2014: Large nodule occupying most of the right lobe measures 5.2 x 3.2 x 4.1 cm. There are some internal calcifications. It previously measured 3.1 x 2.6 x 2.2 cm based on the prior report. - FNA 03/02/2014: FOLLICULAR NEOPLASM AND/OR LESION (BETHESDA IV)  - Right lobectomy 05/16/2014 and completion thyroidectomy 06/01/2014 Thyroid, lobectomy, right - FOLLICULAR VARIANT OF PAPILLARY THYROID CARCINOMA WITH CAPSULAR INVASION, 4.2 CM, CONFINED WITHIN THYROIDAL TISSUE. - NO EVIDENCE OF ANGIOLYMPHATIC INVASION IDENTIFIED. - RESECTION MARGINS, NEGATIVE FOR ATYPIA OR MALIGNANCY. Microscopic Comment THYROID Specimen: Right thyroid Procedure: Right hemithyroidectomy Specimen Integrity (intact/fragmented): Intact Tumor focality: Unifocal Maximum tumor size (cm): 4.2 cm, gross description Tumor laterality: Right thyroid gland Histologic type (including subtype and/or unique features as applicable): Follicular variant of papillary thyroid carcinoma Tumor  capsule: Present Extrathyroidal extension: No Margins: Negative Lymph - Vascular invasion: Not identified Capsular invasion with degree of invasion if present: Present, complete Lymph nodes: # examined N/A; # positive; N/A TNM code: pT3 , pNX Non-neoplastic thyroid: Unremarkable. Due to the low risk of her thyroid tumor, I did not recommend I-131 remnant ablation.  01/07/2016: Neck ultrasound:  Surgical changes of total thyroidectomy without evidence of residual thyroid tissue, nodularity or lymphadenopathy.  Neck U/S (12/14/2019): Post total thyroidectomy without evidence of residual or locally recurrent disease.  Her thyroglobulin levels are fluctuating: Lab Results  Component Value Date   THYROGLB 0.2 (L) 12/05/2021   THYROGLB 0.3 (L) 12/06/2020   THYROGLB 0.4 (L) 12/02/2019   THYROGLB 0.2 (L) 06/22/2017   THYROGLB 0.3 (L) 12/22/2016   THYROGLB 0.6 (L) 12/24/2015   THGAB <1 12/05/2021   THGAB <1 12/06/2020   THGAB <1 12/02/2019   THGAB <1 06/22/2017   THGAB <1 12/22/2016   THGAB <1 12/24/2015   Pt denies: - feeling nodules in neck - hoarseness - dysphagia - choking  Hypothyroidism: Pt. has been dx with hypothyroidism in 2010, now postoperative hypothyroidism; she is on brand-name Synthroid.  Pt is on Synthroid 200 mcg 6/7 days except 100 mcg 1/7 days (equivalent 185 mcg daily)-dose changed by PCP 08/2022: - in am - fasting - at least 30 min from b'fast - no Ca, Fe, MVI, + PPIs at night (for gastric ulcer reportedly) - not on Biotin  Reviewed her TFTs: Lab Results  Component Value Date   TSH 0.04 (L) 09/12/2022   TSH 1.46 12/05/2021   TSH 3.11 12/06/2020   TSH 6.06 (H) 07/25/2020   TSH 5.52 (H) 12/02/2019   FREET4 1.00 12/05/2021   FREET4 0.92  12/06/2020   FREET4 1.02 07/25/2020   FREET4 1.02 12/02/2019   FREET4 1.26 06/21/2018  03/29/2013: TSH 0.93, fT4 1.03 02/04/2013 (towards end of pregnancy): TSH 0.12, fT4 0.96 >> Synthroid reduced from 100 to 88  mcg 12/2012: TSH 0.07 >> Synthroid reduced from 112 to 100 mcg  Vitamin D insufficiency   She continues on 5000 units vitamin D daily.  Reviewed vitamin D levels: Lab Results  Component Value Date   VD25OH 31.17 09/12/2022   VD25OH 34.98 09/10/2021   VD25OH 31.21 07/25/2020   VD25OH 31.0 12/02/2019   VD25OH 32.28 04/26/2019   VD25OH 32.43 01/11/2018   VD25OH 29.92 (L) 06/22/2017   VD25OH 34.67 12/22/2016   VD25OH 23.51 (L) 06/23/2016   VD25OH 9.6 (L) 11/05/2015   In the past, patient complained of palpitations >> started fall 2017 after we last increased her LT4, but exacerbated 06/2016.  Of note, she had full cardiac investigation including a Holter monitor >> PVC and PACs (217 events per 48h).  On metoprolol.  She also has PCOS.  Reviewed latest HbA1c level: Lab Results  Component Value Date   HGBA1C 5.8 09/12/2022  She had diarrhea in the past and came off the metformin transiently.  However, the diarrhea did not resolve so she is back on metformin.  ROS: + see HPI  Past Medical History:  Diagnosis Date   Allergy    Anxiety    Cancer (HCC)    "thyroid cancer - thyroidectomy January 2016"   Chronic kidney disease    Complication of anesthesia    "post operative myalgia"   Depression    Difficult intubation    "due to limited neck mobility"   GERD (gastroesophageal reflux disease)    "not taking Prilosec any longer"   Hashimoto's thyroiditis 2010   History of bronchitis    History of influenza pneumonia 2012   History of kidney stones    lithotripsy   Migraine    migraines- usually x1 weekly   OSA on CPAP    cpap nightly-settings 16   Papillary carcinoma, follicular variant (HCC) 05/31/2014   PCOS (polycystic ovarian syndrome) 2003   takes Metformin for PCOS   Stress incontinence    Wears glasses    Past Surgical History:  Procedure Laterality Date   ACHILLES TENDON SURGERY N/A 01/29/2016   Procedure: RIGHT ACHILLES  RECONSTRUCTION;  Surgeon: Toni Arthurs, MD;  Location: [facility];  Service: Orthopedics;  Laterality: N/A;   BIOPSY  05/13/2022   Procedure: BIOPSY;  Surgeon: Lynann Bologna, MD;  Location: [facility];  Service: Gastroenterology;;   COLONOSCOPY WITH PROPOFOL N/A 05/13/2022   HP, minimal sigmoid diverticulosis, int hem Chales Abrahams)   ESOPHAGOGASTRODUODENOSCOPY (EGD) WITH PROPOFOL N/A 05/13/2022   mild chronic gastritis on biopsy s/p empiric esophageal dilation Chales Abrahams)   EXTRACORPOREAL SHOCK WAVE LITHOTRIPSY Left    GASTROCNEMIUS RECESSION N/A 01/29/2016   Procedure: GASTROC RECESSION POSSIBLE FLEXIBLE FLEXOR HALGUS LONGUS TRANSER;  Surgeon: Toni Arthurs, MD;  Location: [facility];  Service: Orthopedics;  Laterality: N/A;   MALONEY DILATION  05/13/2022   Procedure: Elease Hashimoto DILATION;  Surgeon: Lynann Bologna, MD;  Location: [facility];  Service: Gastroenterology;;   POLYPECTOMY  05/13/2022   Procedure: POLYPECTOMY;  Surgeon: Lynann Bologna, MD;  Location: [facility];  Service: Gastroenterology;;   THYROID LOBECTOMY Right 05/16/2014   Procedure: RIGHT THYROID LOBECTOMY;  Surgeon: Darnell Level, MD;  Location: [facility];  Service: General;  Laterality: Right;   THYROIDECTOMY N/A 06/01/2014   Procedure: COMPLETION THYROIDECTOMY;  Surgeon:  Darnell Level, MD;  Location: [facility];  Service: General;  Laterality: N/A;   TONSILECTOMY, ADENOIDECTOMY, BILATERAL MYRINGOTOMY AND TUBES     1984 and 1989   Social History   Socioeconomic History   Marital status: Married    Spouse name: Not on file   Number of children: 2   Years of education: Not on file   Highest education level: Not on file  Occupational History   Not on file  Tobacco Use   Smoking status: Former    Current packs/day: 0.00    Average packs/day: 0.3 packs/day for 2.0 years (0.5 ttl pk-yrs)    Types: Cigarettes    Start date: 05/26/1996    Quit date: 05/26/1998    Years since quitting: 24.5   Smokeless tobacco: Never   Tobacco comments:    "quit smoking in 2000 - smoked for 2  years"   Vaping Use   Vaping status: Never Used  Substance and Sexual Activity   Alcohol use: No    Alcohol/week: 0.0 standard drinks of alcohol   Drug use: No   Sexual activity: Yes  Other Topics Concern   Not on file  Social History Narrative   Divorced    Lives with 2nd husband and 2 boys (2006, 2014)   Occ: Building services engineer for [employer]   Edu: some college   Social Determinants of Corporate investment banker Strain: Not on Ship broker Insecurity: Not on file  Transportation Needs: Not on file  Physical Activity: Not on file  Stress: Not on file  Social Connections: Not on file  Intimate Partner Violence: Not on file   Current Outpatient Medications on File Prior to Visit  Medication Sig Dispense Refill   buPROPion (WELLBUTRIN XL) 300 MG 24 hr tablet Take 1 tablet (300 mg total) by mouth daily. 90 tablet 4   Cholecalciferol (VITAMIN D3) 5000 units TABS Take 5,000 Units by mouth daily.     cyclobenzaprine (FLEXERIL) 10 MG tablet Take 0.5-1 tablets (5-10 mg total) by mouth 2 (two) times daily as needed (tension headache - with sedation precautions). 30 tablet 0   fluticasone (FLONASE) 50 MCG/ACT nasal spray Place 2 sprays into both nostrils daily. 48 g 3   levocetirizine (XYZAL) 5 MG tablet Take 5 mg by mouth every evening.     metFORMIN (GLUCOPHAGE-XR) 750 MG 24 hr tablet Take 1 tablet (750 mg total) by mouth at bedtime. 90 tablet 4   metoprolol succinate (TOPROL-XL) 100 MG 24 hr tablet Take 1 tablet (100 mg total) by mouth daily. Take with or immediately following a meal. 90 tablet 4   montelukast (SINGULAIR) 10 MG tablet Take 1 tablet (10 mg total) by mouth daily. 90 tablet 4   olopatadine (PATADAY) 0.1 % ophthalmic solution Place 1 drop into both eyes daily.     omeprazole (PRILOSEC) 40 MG capsule Take 1 capsule (40 mg total) by mouth daily. 90 capsule 4   Probiotic Product (PROBIOTIC PO) Take 1 tablet by mouth daily. Seed Probiotic     rosuvastatin (CRESTOR) 10 MG tablet Take  1 tablet (10 mg total) by mouth daily. 90 tablet 4   SYNTHROID 200 MCG tablet Take 1 tablet (200 mcg total) by mouth daily before breakfast. 30 tablet 1   No current facility-administered medications on file prior to visit.   Allergies  Allergen Reactions   Tioconazole Itching, Swelling and Other (See Comments)    Burning (severe)   Tape Hives and Swelling  Adhesive tapes   Buspar [Buspirone]     Worsening mood   Family History  Problem Relation Age of Onset   Thyroid disease Mother        thyroid goiter   Depression Mother    Hyperlipidemia Mother    Hypertension Mother    CAD Mother 57       triple bypass   Hyperlipidemia Father    Hypertension Father    Sleep apnea Father    CAD Father 25       MI, stent   Asthma Brother    Depression Brother    Heart disease Maternal Grandmother    Hyperlipidemia Maternal Grandmother    Heart disease Maternal Grandfather    Hyperlipidemia Maternal Grandfather    Heart disease Paternal Grandmother    Hyperlipidemia Paternal Grandmother    Heart disease Paternal Grandfather    Hyperlipidemia Paternal Grandfather    Diabetes Paternal Grandfather    Sleep apnea Paternal Grandfather    Cancer Neg Hx    Colon cancer Neg Hx    Rectal cancer Neg Hx    Stomach cancer Neg Hx    PE: BP 124/86   Pulse 84   Ht 5' 3.5" (1.613 m)   Wt (!) 325 lb 3.2 oz (147.5 kg)   SpO2 99%   BMI 56.70 kg/m  Wt Readings from Last 3 Encounters:  12/08/22 (!) 325 lb 3.2 oz (147.5 kg)  09/19/22 (!) 330 lb (149.7 kg)  05/30/22 (!) 342 lb 8 oz (155.4 kg)   Constitutional: overweight, in NAD Eyes: EOMI, no exophthalmos ENT:no neck masses palpated, no cervical lymphadenopathy Cardiovascular: RRR, No MRG Respiratory: CTA B Musculoskeletal: no deformities Skin: no rashes Neurological: no tremor with outstretched hands  ASSESSMENT: 1. Follicular variant of PTC  2. Postsurgical hypothyroidism  3. Vitamin D deficiency  PLAN:  1. PTC - Patient  with follicular variant of papillary thyroid cancer, the least aggressive type of cancer.  This was encapsulated and it did not spread outside the thyroid.  She is stage I TNM, with very good prognosis. -No neck compression symptoms -Reviewed the report of the latest neck ultrasound obtained in 2021 and this did not show any recurrences or suspicious masses in her neck -At last visit, thyroglobulin was still detectable, slightly lower, and 0.2.  Antithyroglobulin antibodies are undetectable.  We discussed that in the absence of RAI treatment, thyroglobulin levels can remain detectable and further investigation is only needed when trending up. -Will recheck Tg + ATA today - last visit, she had hoarseness, most likely related to acid reflux.  She is on a PPI.  She had her esophagus stretched in 04/2022. -Will check a neck ultrasound at today's visit or next, depending on the thyroglobulin level -I we will see her back in 1 year  2. Hypothyroidism - latest thyroid labs reviewed with pt. >> TSH was suppressed: Lab Results  Component Value Date   TSH 0.04 (L) 09/12/2022  - she continues on LT4 200 mcg 6/7 days, and 100 mcg 1/7 days, for an equivalent dose of 185 mcg daily. - pt feels good on this dose but continues to have heat intolerance.  Also has longstanding palpitations.  She lost 10 pounds reportedly since last visit. - we discussed about taking the thyroid hormone every day, with water, >30 minutes before breakfast, separated by >4 hours from acid reflux medications, calcium, iron, multivitamins. Pt. is taking it correctly. - will check thyroid tests today: TSH and fT4 - If  labs are abnormal, she will need to return for repeat TFTs in 1.5 months  3. Vitamin D deficiency -This is now managed by PCP -She is is on 5000 units vitamin D daily -At this level was normal in 08/2022  Needs refills.  Component     Latest Ref Rng 12/08/2022  TSH     0.35 - 5.50 uIU/mL 0.96   T4,Free(Direct)      0.60 - 1.60 ng/dL 1.61   Thyroglobulin     ng/mL 0.3 (L)   Comment --   Thyroglobulin Ab     < or = 1 IU/mL <1   Thyroglobulin level approximately stable, antibodies undetectable.  TFTs are at goal.  Carlus Pavlov, MD PhD [facility] Endocrinology

## 2022-12-09 IMAGING — MG DIGITAL SCREENING BILAT W/ CAD
7 series · 7 of 7 positions shown · non-contrast
Comparison: Previous exam(s).

ACR Breast Density Category a: The breast tissue is almost entirely
fatty.

CLINICAL DATA: Screening.

EXAM:
DIGITAL SCREENING BILATERAL MAMMOGRAM WITH CAD
TECHNIQUE: Bilateral screening digital craniocaudal and mediolateral oblique
mammograms were obtained. The images were evaluated with
computer-aided detection.

[R MLO]
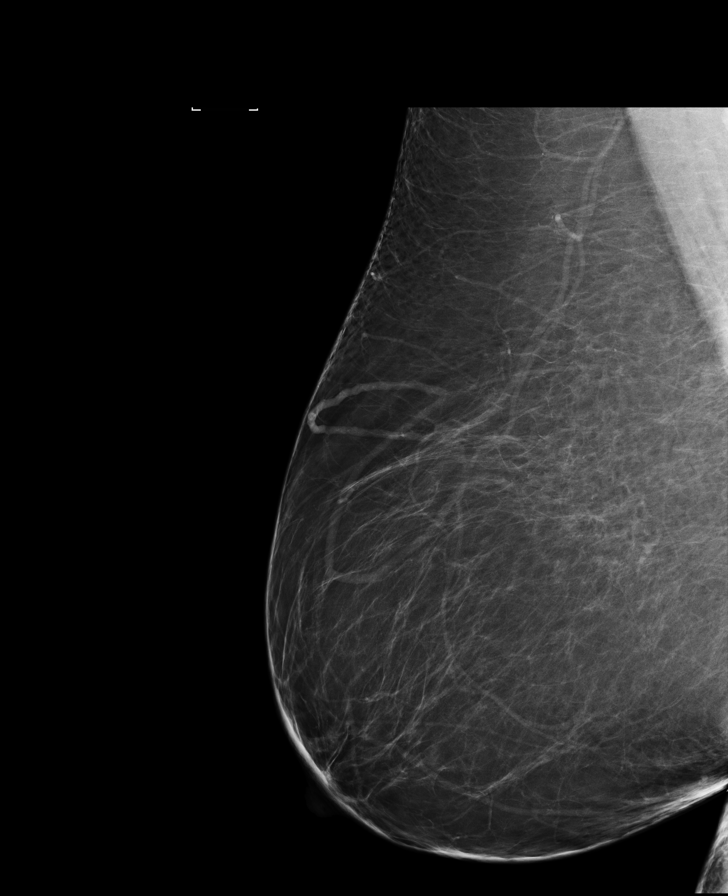

[L CC (1 of 2)]
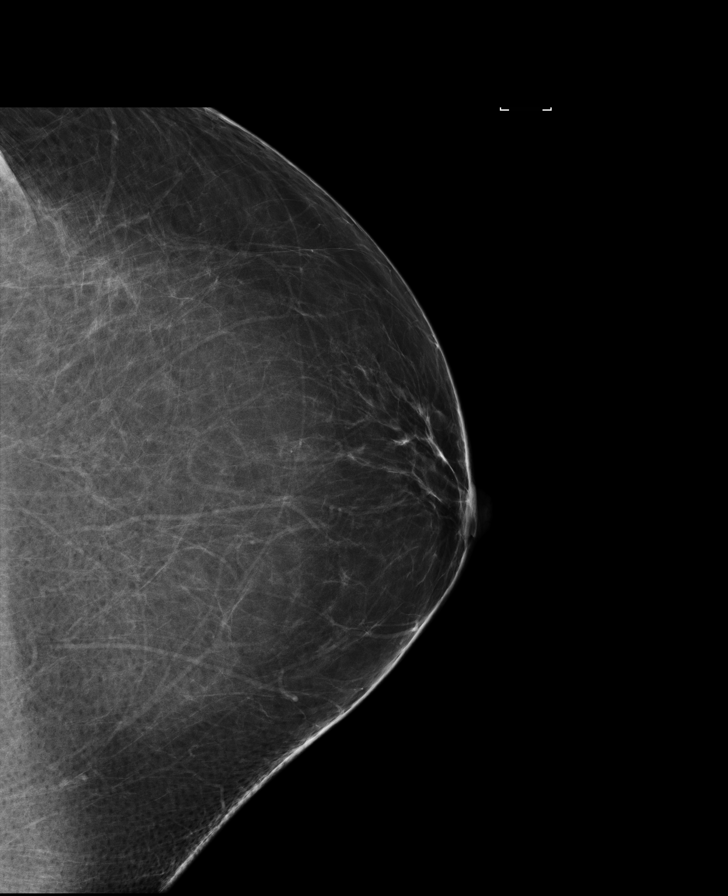

[L CC (2 of 2)]
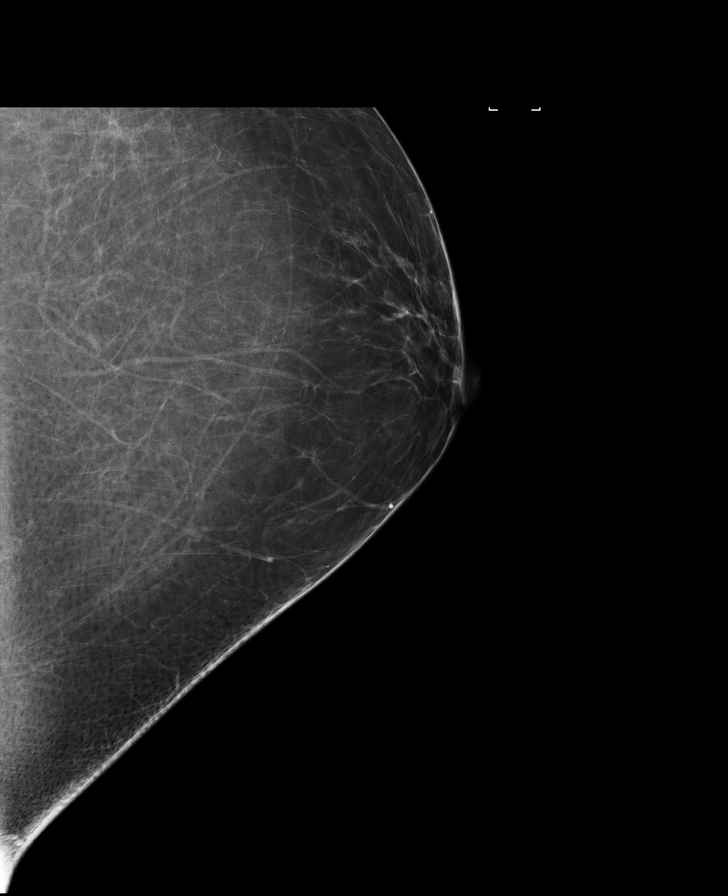

[L MLO (1 of 2)]
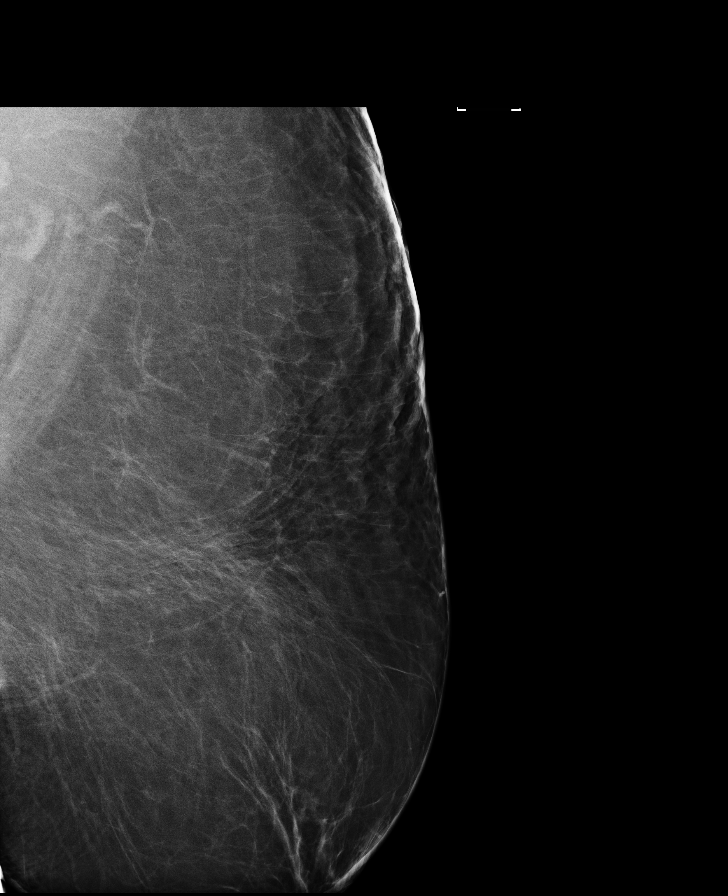

[R CC (1 of 2)]
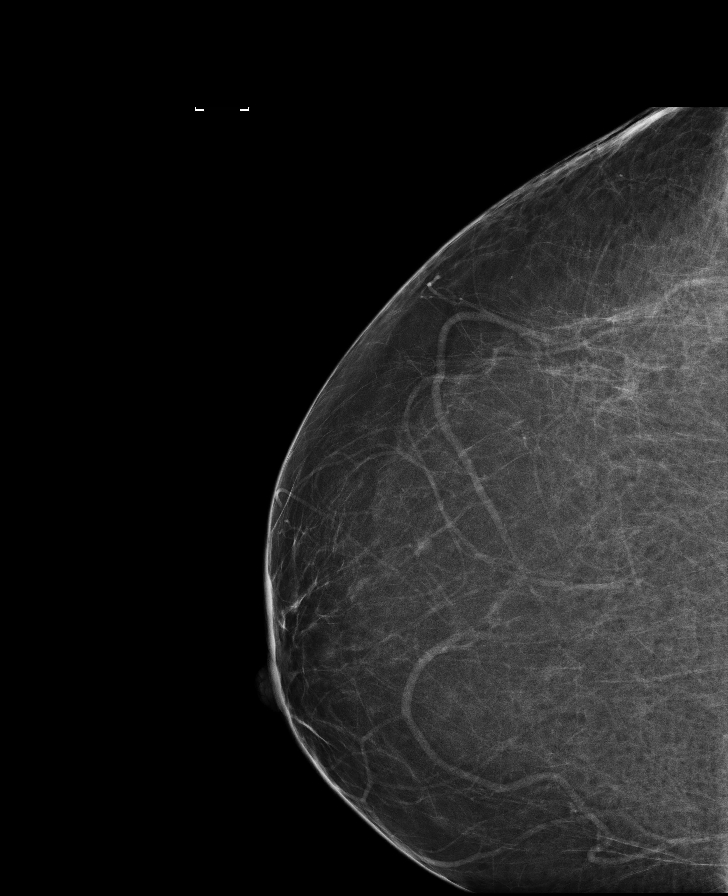

[L MLO (2 of 2)]
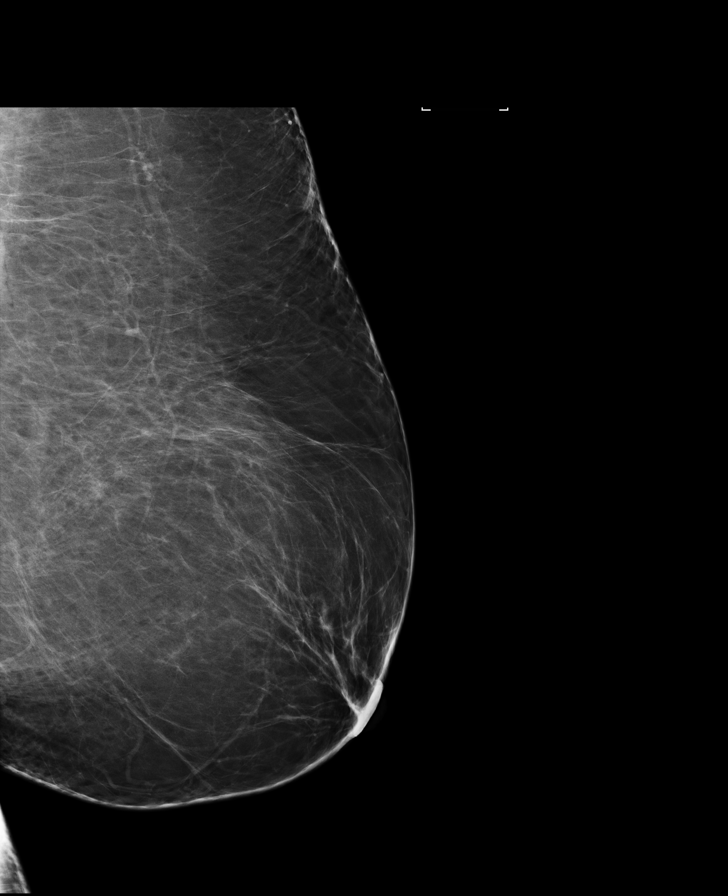

[R CC (2 of 2)]
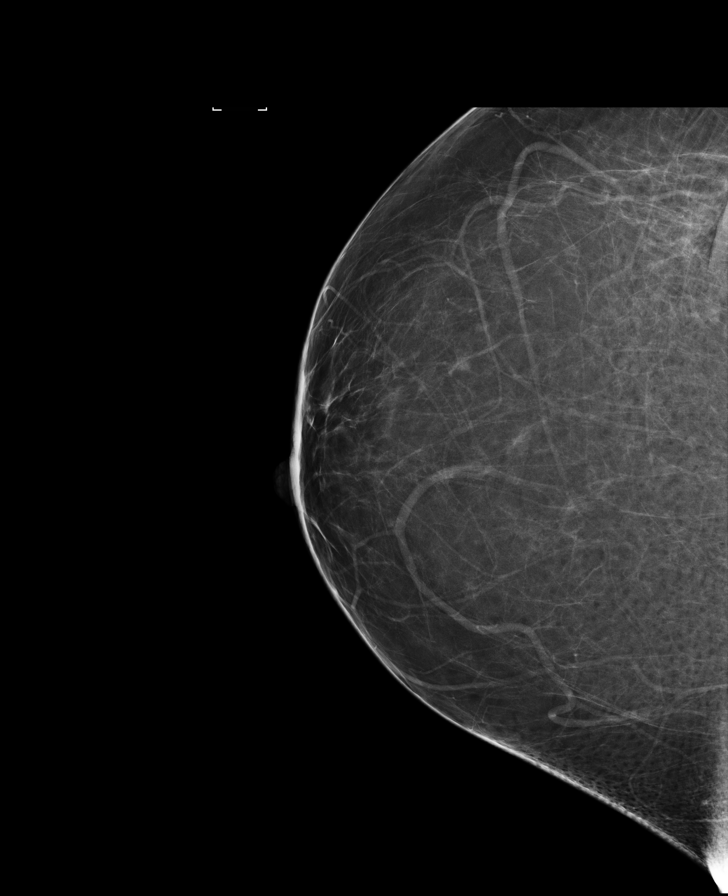

[7 of 7 positions shown; findings below may reference images not displayed]

FINDINGS: There are no findings suspicious for malignancy.
IMPRESSION: No mammographic evidence of malignancy. A result letter of this
screening mammogram will be mailed directly to the patient.

RECOMMENDATION:
Screening mammogram in one year. (Code:9G-A-2OA)

BI-RADS CATEGORY  1: Negative.

## 2022-12-10 LAB — THYROGLOBULIN LEVEL: Thyroglobulin: 0.3 ng/mL — ABNORMAL LOW

## 2022-12-10 LAB — THYROGLOBULIN ANTIBODY: Thyroglobulin Ab: <1 IU/mL (ref <=1)

## 2022-12-11 MED ORDER — SYNTHROID 200 MCG PO TABS: 200.0000 ug | ORAL_TABLET | Freq: Every day | ORAL | 3 refills | Status: DC

## 2022-12-22 ENCOUNTER — Encounter: Payer: Self-pay | Admitting: Internal Medicine

## 2022-12-22 MED ORDER — SYNTHROID 200 MCG PO TABS: 200.0000 ug | ORAL_TABLET | Freq: Every day | ORAL | 0 refills | Status: DC

## 2022-12-22 NOTE — Telephone Encounter (Signed)
Rx sent, pt notified via phone call.

## 2022-12-23 ENCOUNTER — Other Ambulatory Visit: Payer: Self-pay

## 2022-12-23 MED ORDER — SYNTHROID 200 MCG PO TABS: 200.0000 ug | ORAL_TABLET | Freq: Every day | ORAL | 3 refills | Status: DC

## 2022-12-23 NOTE — Telephone Encounter (Signed)
90 day supply has been sent to the [place] and 30 day supply sent 12/22/22 to [facility]. See other message in chart.

## 2023-02-25 ENCOUNTER — Encounter: Payer: Self-pay | Admitting: Internal Medicine

## 2023-02-25 ENCOUNTER — Ambulatory Visit (INDEPENDENT_AMBULATORY_CARE_PROVIDER_SITE_OTHER): Admitting: Internal Medicine

## 2023-02-25 VITALS — BP 104/60 | HR 72 | Temp 97.4°F | Ht 63.5 in | Wt 320.0 lb

## 2023-02-25 DIAGNOSIS — J0140 Acute pansinusitis, unspecified: Secondary | ICD-10-CM | POA: Insufficient documentation

## 2023-02-25 MED ORDER — FLUCONAZOLE 150 MG PO TABS: 150.0000 mg | ORAL_TABLET | ORAL | 1 refills | Status: DC

## 2023-02-25 MED ORDER — AMOXICILLIN-POT CLAVULANATE 875-125 MG PO TABS: 1.0000 | ORAL_TABLET | Freq: Two times a day (BID) | ORAL | 0 refills | Status: DC

## 2023-02-25 NOTE — Progress Notes (Signed)
Subjective:    Patient ID: Linda Welch, female    DOB: 1981-02-18, 42 y.o.   MRN: [id number]  HPI Here due to persistent respiratory symptoms  Started 9/1 after son came home sick Bad cough---eventually productive Typical congestion, sneezing, etc No fever, chills, aches No SOB Tested for COVID and negative Has constant drainage--like drainage--though cough is better Loses voice if she talks a lot  Did try mucinex at first Uses flonase for allergies---stopped after a while due to worsening of laryngitis Singulair/zyzal daily  Current Outpatient Medications on File Prior to Visit  Medication Sig Dispense Refill   buPROPion (WELLBUTRIN XL) 300 MG 24 hr tablet Take 1 tablet (300 mg total) by mouth daily. 90 tablet 4   Cholecalciferol (VITAMIN D3) 5000 units TABS Take 5,000 Units by mouth daily.     cyclobenzaprine (FLEXERIL) 10 MG tablet Take 0.5-1 tablets (5-10 mg total) by mouth 2 (two) times daily as needed (tension headache - with sedation precautions). 30 tablet 0   fluticasone (FLONASE) 50 MCG/ACT nasal spray Place 2 sprays into both nostrils daily. 48 g 3   levocetirizine (XYZAL) 5 MG tablet Take 5 mg by mouth every evening.     metFORMIN (GLUCOPHAGE-XR) 750 MG 24 hr tablet Take 1 tablet (750 mg total) by mouth at bedtime. 90 tablet 4   metoprolol succinate (TOPROL-XL) 100 MG 24 hr tablet Take 1 tablet (100 mg total) by mouth daily. Take with or immediately following a meal. 90 tablet 4   montelukast (SINGULAIR) 10 MG tablet Take 1 tablet (10 mg total) by mouth daily. 90 tablet 4   olopatadine (PATADAY) 0.1 % ophthalmic solution Place 1 drop into both eyes daily.     omeprazole (PRILOSEC) 40 MG capsule Take 1 capsule (40 mg total) by mouth daily. 90 capsule 4   Probiotic Product (PROBIOTIC PO) Take 1 tablet by mouth daily. Seed Probiotic     rosuvastatin (CRESTOR) 10 MG tablet Take 1 tablet (10 mg total) by mouth daily. 90 tablet 4   SYNTHROID 200 MCG tablet Take 1 tablet  (200 mcg total) by mouth daily before breakfast. 90 tablet 3   No current facility-administered medications on file prior to visit.    Allergies  Allergen Reactions   Tioconazole Itching, Swelling and Other (See Comments)    Burning (severe)   Tape Hives and Swelling    Adhesive tapes   Buspar [Buspirone]     Worsening mood    Past Medical History:  Diagnosis Date   Allergy    Anxiety    Cancer (HCC)    "thyroid cancer - thyroidectomy January 2016"   Chronic kidney disease    Complication of anesthesia    "post operative myalgia"   Depression    Difficult intubation    "due to limited neck mobility"   GERD (gastroesophageal reflux disease)    "not taking Prilosec any longer"   Hashimoto's thyroiditis 2010   History of bronchitis    History of influenza pneumonia 2012   History of kidney stones    lithotripsy   Migraine    migraines- usually x1 weekly   OSA on CPAP    cpap nightly-settings 16   Papillary carcinoma, follicular variant (HCC) 05/31/2014   PCOS (polycystic ovarian syndrome) 2003   takes Metformin for PCOS   Stress incontinence    Wears glasses     Past Surgical History:  Procedure Laterality Date   ACHILLES TENDON SURGERY N/A 01/29/2016   Procedure: RIGHT ACHILLES  RECONSTRUCTION;  Surgeon: Toni Arthurs, MD;  Location: [facility] OR;  Service: Orthopedics;  Laterality: N/A;   BIOPSY  05/13/2022   Procedure: BIOPSY;  Surgeon: Lynann Bologna, MD;  Location: [facility];  Service: Gastroenterology;;   COLONOSCOPY WITH PROPOFOL N/A 05/13/2022   HP, minimal sigmoid diverticulosis, int hem Chales Abrahams)   ESOPHAGOGASTRODUODENOSCOPY (EGD) WITH PROPOFOL N/A 05/13/2022   mild chronic gastritis on biopsy s/p empiric esophageal dilation Chales Abrahams)   EXTRACORPOREAL SHOCK WAVE LITHOTRIPSY Left    GASTROCNEMIUS RECESSION N/A 01/29/2016   Procedure: GASTROC RECESSION POSSIBLE FLEXIBLE FLEXOR HALGUS LONGUS TRANSER;  Surgeon: Toni Arthurs, MD;  Location: [facility];  Service: Orthopedics;   Laterality: N/A;   MALONEY DILATION  05/13/2022   Procedure: Elease Hashimoto DILATION;  Surgeon: Lynann Bologna, MD;  Location: [facility];  Service: Gastroenterology;;   POLYPECTOMY  05/13/2022   Procedure: POLYPECTOMY;  Surgeon: Lynann Bologna, MD;  Location: [facility];  Service: Gastroenterology;;   THYROID LOBECTOMY Right 05/16/2014   Procedure: RIGHT THYROID LOBECTOMY;  Surgeon: Darnell Level, MD;  Location: [facility];  Service: General;  Laterality: Right;   THYROIDECTOMY N/A 06/01/2014   Procedure: COMPLETION THYROIDECTOMY;  Surgeon: Darnell Level, MD;  Location: [facility];  Service: General;  Laterality: N/A;   TONSILECTOMY, ADENOIDECTOMY, BILATERAL MYRINGOTOMY AND TUBES     1984 and 1989    Family History  Problem Relation Age of Onset   Thyroid disease Mother        thyroid goiter   Depression Mother    Hyperlipidemia Mother    Hypertension Mother    CAD Mother 88       triple bypass   Hyperlipidemia Father    Hypertension Father    Sleep apnea Father    CAD Father 40       MI, stent   Asthma Brother    Depression Brother    Heart disease Maternal Grandmother    Hyperlipidemia Maternal Grandmother    Heart disease Maternal Grandfather    Hyperlipidemia Maternal Grandfather    Heart disease Paternal Grandmother    Hyperlipidemia Paternal Grandmother    Heart disease Paternal Grandfather    Hyperlipidemia Paternal Grandfather    Diabetes Paternal Grandfather    Sleep apnea Paternal Grandfather    Cancer Neg Hx    Colon cancer Neg Hx    Rectal cancer Neg Hx    Stomach cancer Neg Hx     Social History   Socioeconomic History   Marital status: Married    Spouse name: Not on file   Number of children: 2   Years of education: Not on file   Highest education level: Not on file  Occupational History   Not on file  Tobacco Use   Smoking status: Former    Current packs/day: 0.00    Average packs/day: 0.3 packs/day for 2.0 years (0.5 ttl pk-yrs)    Types: Cigarettes     Start date: 05/26/1996    Quit date: 05/26/1998    Years since quitting: 24.7   Smokeless tobacco: Never   Tobacco comments:    "quit smoking in 2000 - smoked for 2 years"   Vaping Use   Vaping status: Never Used  Substance and Sexual Activity   Alcohol use: No    Alcohol/week: 0.0 standard drinks of alcohol   Drug use: No   Sexual activity: Yes  Other Topics Concern   Not on file  Social History Narrative   Divorced    Lives with 2nd husband and 2 boys (2006,  2014)   Occ: florist for [employer]   Edu: some college   Social Determinants of Health   Financial Resource Strain: Not on file  Food Insecurity: Not on file  Transportation Needs: Not on file  Physical Activity: Not on file  Stress: Not on file  Social Connections: Not on file  Intimate Partner Violence: Not on file   Review of Systems No N/V Eating fair--appetite gone since widowed Takes omeprazole daily--no heartburn      Objective:   Physical Exam Constitutional:      Appearance: Normal appearance.  HENT:     Head:     Comments: No sinus tenderness    Right Ear: Tympanic membrane and ear canal normal.     Left Ear: Tympanic membrane and ear canal normal.     Mouth/Throat:     Pharynx: No oropharyngeal exudate or posterior oropharyngeal erythema.  Pulmonary:     Effort: Pulmonary effort is normal.     Breath sounds: Normal breath sounds. No wheezing or rales.  Musculoskeletal:     Cervical back: Neck supple.  Lymphadenopathy:     Cervical: No cervical adenopathy.  Neurological:     Mental Status: She is alert.            Assessment & Plan:

## 2023-02-25 NOTE — Assessment & Plan Note (Signed)
Persistent drainage, etc since viral URI Likely secondary sinus infection Is on allergy regimen Will try augmentin 875 bid x 7 days  Fluconazole for prn

## 2023-03-23 ENCOUNTER — Encounter: Payer: Self-pay | Admitting: Family Medicine

## 2023-03-23 ENCOUNTER — Ambulatory Visit (INDEPENDENT_AMBULATORY_CARE_PROVIDER_SITE_OTHER): Admitting: Family Medicine

## 2023-03-23 ENCOUNTER — Encounter: Payer: Self-pay | Admitting: *Deleted

## 2023-03-23 VITALS — BP 120/76 | HR 70 | Temp 98.0°F | Ht 63.5 in | Wt 333.2 lb

## 2023-03-23 DIAGNOSIS — Z8249 Family history of ischemic heart disease and other diseases of the circulatory system: Secondary | ICD-10-CM | POA: Diagnosis not present

## 2023-03-23 DIAGNOSIS — F331 Major depressive disorder, recurrent, moderate: Secondary | ICD-10-CM

## 2023-03-23 DIAGNOSIS — R490 Dysphonia: Secondary | ICD-10-CM | POA: Diagnosis not present

## 2023-03-23 DIAGNOSIS — E785 Hyperlipidemia, unspecified: Secondary | ICD-10-CM | POA: Diagnosis not present

## 2023-03-23 DIAGNOSIS — Z148 Genetic carrier of other disease: Secondary | ICD-10-CM

## 2023-03-23 DIAGNOSIS — E611 Iron deficiency: Secondary | ICD-10-CM | POA: Diagnosis not present

## 2023-03-23 DIAGNOSIS — K760 Fatty (change of) liver, not elsewhere classified: Secondary | ICD-10-CM

## 2023-03-23 NOTE — Progress Notes (Signed)
Ph: [phone] Fax: [phone]   Patient ID: Linda Welch, female    DOB: June 29, 1980, 42 y.o.   MRN: [id number]  This visit was conducted in person.  BP 120/76   Pulse 70   Temp 98 F (36.7 C) (Oral)   Ht 5' 3.5" (1.613 m)   Wt (!) 333 lb 4 oz (151.2 kg)   LMP 03/12/2023   SpO2 99%   BMI 58.11 kg/m    CC: 6 mo f/u visit  Subjective:   HPI: Linda Welch is a 42 y.o. female presenting on 03/23/2023 for Medical Management of Chronic Issues (Here for 6 mo f/u. Also, c/o ongoing hoarseness for past 2 mo after being sick. )   Husband passed away in 29-Nov-2022. She just completed [employer] grief caregiver assistance program through [place].  She is involved in TAPS (tragedy assistance program through [place]).  Will let us know if she desires more therapy/counseling.  Youngest son recently diagnosed with ADHD.  She thins she also has adult ADHD.   Possible sinusitis seen at beginning of the month, treated with augmentin - but she never took antibiotic. Persistent hoarseness since then - for 2 months. She stays worried in h/o thyroid cancer. No cough, does have globus sensation that never goes away. No GERD - she continues omeprazole 40mg  daily. H/o allergic rhinitis in fall - manages with singulair and xyzal. Flonase worsens hoarseness so she stopped this - no improvement. She regularly uses nasal saline as well. Has seen ENT for chronic ear and sinus infections due to allergic rhinitis. H/o ear tubes, tonsillectomy/adenoidectomy.   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 Followed by endo Dr Elvera Lennox.   Vit D Def - managed with 5000 units vit D daily.  PCOS - continues metformin XR 750mg  daily.  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  Lab Results  Component Value Date   ALT 18 09/12/2022   AST 20  09/12/2022   ALKPHOS 88 09/12/2022   BILITOT 0.3 09/12/2022   Chronic diarrhea with abd discomfort - symptoms improved off sertraline.  Stomach virus last week - now better. Mood stable on Wellbutrin XL 300mg  daily.   Hemochromatosis carrier, with iron deficiency. Last visit we started oral iron every other day x1 month. She hasn't tried this. Overall energy is better.  Lab Results  Component Value Date   IRON 33 (L) 09/12/2022   TIBC 417.2 09/12/2022   FERRITIN 10.4 09/12/2022    Lab Results  Component Value Date   WBC 6.0 09/12/2022   HGB 12.5 09/12/2022   HCT 38.4 09/12/2022   MCV 84.5 09/12/2022   PLT 269.0 09/12/2022    HLD - on crestor 10mg . She has been off statin for 6 wks.  Lab Results  Component Value Date   CHOL 169 09/12/2022   HDL 40.50 09/12/2022   LDLCALC 102 (H) 09/12/2022   TRIG 128.0 09/12/2022   CHOLHDL 4 09/12/2022   The 10-year ASCVD risk score (Arnett DK, et al., 2019) is: 1.6%   Values used to calculate the score:     Age: 22 years     Sex: Female     Is Non-Hispanic African American: No     Diabetic: Yes     Tobacco smoker: No     Systolic Blood Pressure: 120 mmHg     Is BP  treated: No     HDL Cholesterol: 40.5 mg/dL     Total Cholesterol: 169 mg/dL       Relevant past medical, surgical, family and social history reviewed and updated as indicated. Interim medical history since our last visit reviewed. Allergies and medications reviewed and updated. Outpatient Medications Prior to Visit  Medication Sig Dispense Refill   buPROPion (WELLBUTRIN XL) 300 MG 24 hr tablet Take 1 tablet (300 mg total) by mouth daily. 90 tablet 4   Cholecalciferol (VITAMIN D3) 5000 units TABS Take 5,000 Units by mouth daily.     cyclobenzaprine (FLEXERIL) 10 MG tablet Take 0.5-1 tablets (5-10 mg total) by mouth 2 (two) times daily as needed (tension headache - with sedation precautions). 30 tablet 0   fluticasone (FLONASE) 50 MCG/ACT nasal spray Place 2 sprays into  both nostrils daily. 48 g 3   levocetirizine (XYZAL) 5 MG tablet Take 5 mg by mouth every evening.     metFORMIN (GLUCOPHAGE-XR) 750 MG 24 hr tablet Take 1 tablet (750 mg total) by mouth at bedtime. 90 tablet 4   metoprolol succinate (TOPROL-XL) 100 MG 24 hr tablet Take 1 tablet (100 mg total) by mouth daily. Take with or immediately following a meal. 90 tablet 4   montelukast (SINGULAIR) 10 MG tablet Take 1 tablet (10 mg total) by mouth daily. 90 tablet 4   olopatadine (PATADAY) 0.1 % ophthalmic solution Place 1 drop into both eyes daily.     omeprazole (PRILOSEC) 40 MG capsule Take 1 capsule (40 mg total) by mouth daily. 90 capsule 4   Probiotic Product (PROBIOTIC PO) Take 1 tablet by mouth daily. Seed Probiotic     rosuvastatin (CRESTOR) 10 MG tablet Take 1 tablet (10 mg total) by mouth daily. 90 tablet 4   SYNTHROID 200 MCG tablet Take 1 tablet (200 mcg total) by mouth daily before breakfast. 90 tablet 3   amoxicillin-clavulanate (AUGMENTIN) 875-125 MG tablet Take 1 tablet by mouth 2 (two) times daily. 14 tablet 0   fluconazole (DIFLUCAN) 150 MG tablet Take 1 tablet (150 mg total) by mouth once a week. 2 tablet 1   No facility-administered medications prior to visit.     Per HPI unless specifically indicated in ROS section below Review of Systems  Objective:  BP 120/76   Pulse 70   Temp 98 F (36.7 C) (Oral)   Ht 5' 3.5" (1.613 m)   Wt (!) 333 lb 4 oz (151.2 kg)   LMP 03/12/2023   SpO2 99%   BMI 58.11 kg/m   Wt Readings from Last 3 Encounters:  03/23/23 (!) 333 lb 4 oz (151.2 kg)  02/25/23 (!) 320 lb (145.2 kg)  12/08/22 (!) 325 lb 3.2 oz (147.5 kg)      Physical Exam Vitals and nursing note reviewed.  Constitutional:      Appearance: Normal appearance. She is not ill-appearing.  HENT:     Head: Normocephalic and atraumatic.     Right Ear: Tympanic membrane and ear canal normal.     Left Ear: Tympanic membrane and ear canal normal.     Nose: Nose normal. No congestion  or rhinorrhea.     Mouth/Throat:     Mouth: Mucous membranes are moist.     Pharynx: Oropharynx is clear. No oropharyngeal exudate or posterior oropharyngeal erythema.  Eyes:     Extraocular Movements: Extraocular movements intact.     Conjunctiva/sclera: Conjunctivae normal.     Pupils: Pupils are equal, round, and reactive to  light.  Neck:     Thyroid: No thyroid mass or thyromegaly.  Cardiovascular:     Rate and Rhythm: Normal rate and regular rhythm.     Pulses: Normal pulses.     Heart sounds: Normal heart sounds. No murmur heard. Pulmonary:     Effort: Pulmonary effort is normal. No respiratory distress.     Breath sounds: Normal breath sounds. No wheezing, rhonchi or rales.  Musculoskeletal:     Cervical back: Normal range of motion and neck supple. No tenderness.     Right lower leg: No edema.     Left lower leg: No edema.  Skin:    General: Skin is warm and dry.     Findings: No rash.  Neurological:     Mental Status: She is alert.  Psychiatric:        Mood and Affect: Mood normal.        Behavior: Behavior normal.       Results for orders placed or performed in visit on 12/08/22  TSH  Result Value Ref Range   TSH 0.96 0.35 - 5.50 uIU/mL  T4, free  Result Value Ref Range   Free T4 1.11 0.60 - 1.60 ng/dL  Thyroglobulin Level  Result Value Ref Range   Thyroglobulin 0.3 (L) ng/mL   Comment    Thyroglobulin antibody  Result Value Ref Range   Thyroglobulin Ab <1 < or = 1 IU/mL    Assessment & Plan:   Problem List Items Addressed This Visit     Hemochromatosis carrier   MDD (major depressive disorder), recurrent episode, moderate (HCC)    Overall stable period on Wellbutrin XL 300mg  daily.       Hyperlipidemia    Chronic ,stable on crestor 10mg  daily - continue this. She did miss a few weeks after husband passed but now back on med.  The 10-year ASCVD risk score (Arnett DK, et al., 2019) is: 1.6%   Values used to calculate the score:     Age: 24 years      Sex: Female     Is Non-Hispanic African American: No     Diabetic: Yes     Tobacco smoker: No     Systolic Blood Pressure: 120 mmHg     Is BP treated: No     HDL Cholesterol: 40.5 mg/dL     Total Cholesterol: 169 mg/dL       Relevant Orders   Comprehensive metabolic panel   Lipid panel   Fatty liver disease, nonalcoholic    Presumed NASH. Low Fib4 score.       Family history of premature CAD    Update Lp(a) next labs.       Relevant Orders   Lipoprotein A (LPA)   Iron deficiency    Low iron stores with low oral iron despite hemochromatosis carrier  Will trial oral iron QOD x 1 month, recheck iron levels in 2 months.  Notes overall energy levels are good.       Relevant Orders   Ferritin   IBC panel   Hoarseness - Primary    Ongoing hoarseness x 2 moths since initial illness 01/25/2023.  Overall reassuring exam. She continues allergic rhinitis treatment with singulair, xyzal, flonase, nasal saline.  She continues GERD treatment with omeprazole 40mg  daily - will add pepcid 20mg  nightly . Given duration of hoarseness, will refer to ENT for further evaluation.       Relevant Orders   Ambulatory referral to ENT  No orders of the defined types were placed in this encounter.   Orders Placed This Encounter  Procedures   Comprehensive metabolic panel    Standing Status:   Future    Standing Expiration Date:   03/22/2024   Ferritin    Standing Status:   Future    Standing Expiration Date:   03/22/2024   IBC panel    Standing Status:   Future    Standing Expiration Date:   03/22/2024   Lipoprotein A (LPA)    Standing Status:   Future    Standing Expiration Date:   03/22/2024   Lipid panel    Standing Status:   Future    Standing Expiration Date:   03/22/2024   Ambulatory referral to ENT    Referral Priority:   Routine    Referral Type:   Consultation    Referral Reason:   Specialty Services Required    Requested Specialty:   Otolaryngology    Number of  Visits Requested:   1    Patient Instructions  Trial oral iron every other day for a month then schedule lab visit to recheck blood work.  For hoarseness - continue omeprazole 40mg  daily. Add pepcid 20mg  nightly for the next 1 week. We will also refer you to ENT.  Return in 6 months for physical.   Call to schedule mammogram at your convenience (summer 2024): [facility] [phone]   Follow up plan: Return in about 6 months (around 09/21/2023) for annual exam, prior fasting for blood work.  Eustaquio Boyden, MD

## 2023-03-23 NOTE — Assessment & Plan Note (Signed)
Chronic ,stable on crestor 10mg  daily - continue this. She did miss a few weeks after husband passed but now back on med.  The 10-year ASCVD risk score (Arnett DK, et al., 2019) is: 1.6%   Values used to calculate the score:     Age: 42 years     Sex: Female     Is Non-Hispanic African American: No     Diabetic: Yes     Tobacco smoker: No     Systolic Blood Pressure: 120 mmHg     Is BP treated: No     HDL Cholesterol: 40.5 mg/dL     Total Cholesterol: 169 mg/dL

## 2023-03-23 NOTE — Patient Instructions (Addendum)
Trial oral iron every other day for a month then schedule lab visit to recheck blood work.  For hoarseness - continue omeprazole 40mg  daily. Add pepcid 20mg  nightly for the next 1 week. We will also refer you to ENT.  Return in 6 months for physical.   Call to schedule mammogram at your convenience (summer 2024): [facility] [phone]

## 2023-03-23 NOTE — Assessment & Plan Note (Signed)
Low iron stores with low oral iron despite hemochromatosis carrier  Will trial oral iron QOD x 1 month, recheck iron levels in 2 months.  Notes overall energy levels are good.

## 2023-03-23 NOTE — Assessment & Plan Note (Signed)
Overall stable period on Wellbutrin XL 300mg  daily.

## 2023-03-23 NOTE — Assessment & Plan Note (Signed)
Ongoing hoarseness x 2 moths since initial illness 01/25/2023.  Overall reassuring exam. She continues allergic rhinitis treatment with singulair, xyzal, flonase, nasal saline.  She continues GERD treatment with omeprazole 40mg  daily - will add pepcid 20mg  nightly . Given duration of hoarseness, will refer to ENT for further evaluation.

## 2023-03-23 NOTE — Assessment & Plan Note (Addendum)
Presumed NASH. Low Fib4 score.

## 2023-03-23 NOTE — Assessment & Plan Note (Signed)
Update Lp(a) next labs.

## 2023-04-28 ENCOUNTER — Other Ambulatory Visit

## 2023-07-13 ENCOUNTER — Encounter: Payer: Self-pay | Admitting: Family Medicine

## 2023-07-13 ENCOUNTER — Ambulatory Visit: Payer: Self-pay | Admitting: Family Medicine

## 2023-07-13 ENCOUNTER — Ambulatory Visit (INDEPENDENT_AMBULATORY_CARE_PROVIDER_SITE_OTHER): Admitting: Family Medicine

## 2023-07-13 VITALS — BP 110/70 | HR 85 | Temp 98.0°F | Ht 63.5 in | Wt 345.0 lb

## 2023-07-13 DIAGNOSIS — J029 Acute pharyngitis, unspecified: Secondary | ICD-10-CM

## 2023-07-13 LAB — POC COVID19 BINAXNOW: SARS Coronavirus 2 Ag: NEGATIVE

## 2023-07-13 LAB — POC INFLUENZA A&B (BINAX/QUICKVUE)
Influenza A, POC: NEGATIVE
Influenza B, POC: NEGATIVE

## 2023-07-13 LAB — POCT RAPID STREP A (OFFICE): Rapid Strep A Screen: NEGATIVE

## 2023-07-13 NOTE — Progress Notes (Signed)
Aleeah Greeno T. Jewel Mcafee, MD, CAQ Sports Medicine [facility] at [facility] [address] [place], [address]  Phone: [phone]  FAX: [phone]  Linda Welch - 43 y.o. female  MRN [id number]  Date of Birth: 10-04-1980  Date: 07/13/2023  PCP: Eustaquio Boyden, MD  Referral: Eustaquio Boyden, MD  Chief Complaint  Patient presents with   Sore Throat    Started on Friday- Son recently had Strep   Subjective:   Linda Welch is a 43 y.o. very pleasant female patient with Body mass index is 60.16 kg/m. who presents with the following:  Acute sore throat -have some sore throat last Friday She started to have, and her son very recently had some strep throat that responded well to a penicillin-based antibiotic.  She also has some discomfort in and about the neck and had some global aching.  She denies fever, chills, sweats, nausea, vomiting, diarrhea, dysuria, new or different back pain.  Some aching  Son Strep Son Flu    Review of Systems is noted in the HPI, as appropriate  Objective:   BP 110/70 (BP Location: Left Arm, Patient Position: Sitting, Cuff Size: Large)   Pulse 85   Temp 98 F (36.7 C) (Temporal)   Ht 5' 3.5" (1.613 m)   Wt (!) 345 lb (156.5 kg)   LMP 06/14/2023   SpO2 98%   BMI 60.16 kg/m    Gen: WDWN, NAD. Globally Non-toxic HEENT: Throat clear, w/o exudate, R TM clear, L TM - good landmarks, No fluid present. No rhinnorhea.  MMM Frontal sinuses: NT Max sinuses: NT NECK: Anterior cervical  LAD is absent CV: RRR, No M/G/R, cap refill <2 sec PULM: Breathing comfortably in no respiratory distress. no wheezing, crackles, rhonchi   Laboratory and Imaging Data: Results for orders placed or performed in visit on 07/13/23  POC COVID-19   Collection Time: 07/13/23  3:43 PM  Result Value Ref Range   SARS Coronavirus 2 Ag Negative Negative  POC Influenza A&B (Binax test)   Collection Time: 07/13/23  3:43 PM  Result Value  Ref Range   Influenza A, POC Negative Negative   Influenza B, POC Negative Negative  POCT rapid strep A   Collection Time: 07/13/23  3:43 PM  Result Value Ref Range   Rapid Strep A Screen Negative Negative     Assessment and Plan:     ICD-10-CM   1. Viral pharyngitis  J02.9     2. Sore throat  J02.9 POC COVID-19    POC Influenza A&B (Binax test)    POCT rapid strep A     All testing is negative.  Most likely this is a viral pharyngitis.  Abortive care.  Orders placed today for conditions managed today: Orders Placed This Encounter  Procedures   POC COVID-19   POC Influenza A&B (Binax test)   POCT rapid strep A    Disposition: No follow-ups on file.  Dragon [employer] One speech-to-text software was used for transcription in this dictation.  Possible transcriptional errors can occur using Animal nutritionist.   Signed,  Elpidio Galea. Zaccary Creech, MD   Outpatient Encounter Medications as of 07/13/2023  Medication Sig   buPROPion (WELLBUTRIN XL) 300 MG 24 hr tablet Take 1 tablet (300 mg total) by mouth daily.   Cholecalciferol (VITAMIN D3) 5000 units TABS Take 5,000 Units by mouth daily.   cyclobenzaprine (FLEXERIL) 10 MG tablet Take 0.5-1 tablets (5-10 mg total) by mouth 2 (two) times daily as  needed (tension headache - with sedation precautions).   fluticasone (FLONASE) 50 MCG/ACT nasal spray Place 2 sprays into both nostrils daily.   levocetirizine (XYZAL) 5 MG tablet Take 5 mg by mouth every evening.   metFORMIN (GLUCOPHAGE-XR) 750 MG 24 hr tablet Take 1 tablet (750 mg total) by mouth at bedtime.   metoprolol succinate (TOPROL-XL) 100 MG 24 hr tablet Take 1 tablet (100 mg total) by mouth daily. Take with or immediately following a meal.   montelukast (SINGULAIR) 10 MG tablet Take 1 tablet (10 mg total) by mouth daily.   olopatadine (PATADAY) 0.1 % ophthalmic solution Place 1 drop into both eyes daily.   omeprazole (PRILOSEC) 40 MG capsule Take 1 capsule (40 mg total) by mouth  daily.   Probiotic Product (PROBIOTIC PO) Take 1 tablet by mouth daily. Seed Probiotic   rosuvastatin (CRESTOR) 10 MG tablet Take 1 tablet (10 mg total) by mouth daily.   SYNTHROID 200 MCG tablet Take 1 tablet (200 mcg total) by mouth daily before breakfast.   No facility-administered encounter medications on file as of 07/13/2023.

## 2023-07-13 NOTE — Telephone Encounter (Signed)
  Chief Complaint: Sore throat Symptoms: sore throat, body aches, swollen lymph nodes  Frequency: Sore throat since Friday Pertinent Negatives: Patient denies fever Disposition: [] ED /[] Urgent Care (no appt availability in office) / [x] Appointment(In office/virtual)/ []  [facility]/ [] Home Care/ [] Refused Recommended Disposition /[] [facility]/ []  Follow-up with PCP Additional Notes: patient called with complaints of sore throat since Friday with body aches and swollen lymph nodes that started yesterday. Patient states that one of her sons was positive for strep and her other son was positive for flu. Patient endorses generally not feeling well. Per protocol, the recommendation is for an appointment. Appointment made for today, 07/13/2023, at 3:20 pm at PCP office. Patient verbalized understanding and all questions answered.      Copied from CRM [id number]. Topic: Clinical - Red Word Triage >> Jul 13, 2023 12:43 PM Irine Seal wrote: [facility] that prompted transfer to Nurse Triage: The patient reports having a sore throat since Friday, along with swollen lymph nodes and body aches. She states that the pain is severe and describes the sensation as feeling like a sore throat after extubation-swollen and bruised. The patient has been exposed to both the flu and strep. She mentions that Advil is providing some relief from the discomfort. The patient also has a history of tonsillectomy. the patient uses a CPAP machine and fell asleep without it. She woke up with a sore throat but stated that she doesn't believe missing the CPAP would cause this level of pain for such a prolonged period Reason for Disposition  SEVERE (e.g., excruciating) throat pain  Answer Assessment - Initial Assessment Questions 1. ONSET: "When did the throat start hurting?" (Hours or days ago)      Started friday 2. SEVERITY: "How bad is the sore throat?" (Scale 1-10; mild, moderate or severe)   - MILD (1-3):   Doesn't interfere with eating or normal activities.   - MODERATE (4-7): Interferes with eating some solids and normal activities.   - SEVERE (8-10):  Excruciating pain, interferes with most normal activities.   - SEVERE WITH DYSPHAGIA (10): Can't swallow liquids, drooling.     4 out of 10 3. STREP EXPOSURE: "Has there been any exposure to strep within the past week?" If Yes, ask: "What type of contact occurred?"      Yes but has had her tonsils removed 4.  VIRAL SYMPTOMS: "Are there any symptoms of a cold, such as a runny nose, cough, hoarse voice or red eyes?"     Body aches 5. FEVER: "Do you have a fever?" If Yes, ask: "What is your temperature, how was it measured, and when did it start?"     No-but felt feverish but checked temperature and no fever. 6. PUS ON THE TONSILS: "Is there pus on the tonsils in the back of your throat?"     No 7. OTHER SYMPTOMS: "Do you have any other symptoms?" (e.g., difficulty breathing, headache, rash)     Hurts to swallow 8. PREGNANCY: "Is there any chance you are pregnant?" "When was your last menstrual period?"     No  Protocols used: Sore Throat-A-AH

## 2023-09-08 ENCOUNTER — Encounter: Payer: Self-pay | Admitting: Family Medicine

## 2023-09-08 DIAGNOSIS — F331 Major depressive disorder, recurrent, moderate: Secondary | ICD-10-CM

## 2023-09-08 DIAGNOSIS — E785 Hyperlipidemia, unspecified: Secondary | ICD-10-CM

## 2023-09-08 DIAGNOSIS — R7303 Prediabetes: Secondary | ICD-10-CM

## 2023-09-08 DIAGNOSIS — J309 Allergic rhinitis, unspecified: Secondary | ICD-10-CM

## 2023-09-08 MED ORDER — METOPROLOL SUCCINATE ER 100 MG PO TB24: 100.0000 mg | ORAL_TABLET | Freq: Every day | ORAL | 0 refills | Status: DC

## 2023-09-08 MED ORDER — MONTELUKAST SODIUM 10 MG PO TABS
10.0000 mg | ORAL_TABLET | Freq: Every day | ORAL | 0 refills | Status: DC
Start: 2023-09-08 — End: 2023-12-02

## 2023-09-08 MED ORDER — BUPROPION HCL ER (XL) 300 MG PO TB24: 300.0000 mg | ORAL_TABLET | Freq: Every day | ORAL | 0 refills | Status: DC

## 2023-09-08 MED ORDER — OMEPRAZOLE 40 MG PO CPDR: 40.0000 mg | DELAYED_RELEASE_CAPSULE | Freq: Every day | ORAL | 0 refills | Status: DC

## 2023-09-08 MED ORDER — ROSUVASTATIN CALCIUM 10 MG PO TABS: 10.0000 mg | ORAL_TABLET | Freq: Every day | ORAL | 0 refills | Status: DC

## 2023-09-08 MED ORDER — METFORMIN HCL ER 750 MG PO TB24: 750.0000 mg | ORAL_TABLET | Freq: Every day | ORAL | 0 refills | Status: DC

## 2023-09-08 NOTE — Telephone Encounter (Signed)
 E-scribed refills requested.

## 2023-11-04 ENCOUNTER — Encounter: Payer: Self-pay | Admitting: Internal Medicine

## 2023-11-04 MED ORDER — SYNTHROID 200 MCG PO TABS: 200.0000 ug | ORAL_TABLET | Freq: Every day | ORAL | 3 refills | Status: DC

## 2023-12-02 ENCOUNTER — Ambulatory Visit: Admitting: Family Medicine

## 2023-12-02 VITALS — BP 124/82 | HR 82 | Temp 98.4°F | Ht 62.0 in | Wt 352.6 lb

## 2023-12-02 DIAGNOSIS — E559 Vitamin D deficiency, unspecified: Secondary | ICD-10-CM

## 2023-12-02 DIAGNOSIS — E611 Iron deficiency: Secondary | ICD-10-CM

## 2023-12-02 DIAGNOSIS — E785 Hyperlipidemia, unspecified: Secondary | ICD-10-CM

## 2023-12-02 DIAGNOSIS — Z0001 Encounter for general adult medical examination with abnormal findings: Secondary | ICD-10-CM

## 2023-12-02 DIAGNOSIS — F331 Major depressive disorder, recurrent, moderate: Secondary | ICD-10-CM

## 2023-12-02 DIAGNOSIS — Z23 Encounter for immunization: Secondary | ICD-10-CM | POA: Diagnosis not present

## 2023-12-02 DIAGNOSIS — R7303 Prediabetes: Secondary | ICD-10-CM

## 2023-12-02 DIAGNOSIS — M545 Low back pain, unspecified: Secondary | ICD-10-CM

## 2023-12-02 DIAGNOSIS — Z148 Genetic carrier of other disease: Secondary | ICD-10-CM

## 2023-12-02 DIAGNOSIS — E890 Postprocedural hypothyroidism: Secondary | ICD-10-CM

## 2023-12-02 DIAGNOSIS — Z8249 Family history of ischemic heart disease and other diseases of the circulatory system: Secondary | ICD-10-CM

## 2023-12-02 DIAGNOSIS — R490 Dysphonia: Secondary | ICD-10-CM

## 2023-12-02 DIAGNOSIS — E282 Polycystic ovarian syndrome: Secondary | ICD-10-CM

## 2023-12-02 DIAGNOSIS — G4733 Obstructive sleep apnea (adult) (pediatric): Secondary | ICD-10-CM

## 2023-12-02 DIAGNOSIS — G8929 Other chronic pain: Secondary | ICD-10-CM

## 2023-12-02 DIAGNOSIS — K760 Fatty (change of) liver, not elsewhere classified: Secondary | ICD-10-CM

## 2023-12-02 DIAGNOSIS — Z8632 Personal history of gestational diabetes: Secondary | ICD-10-CM

## 2023-12-02 DIAGNOSIS — J309 Allergic rhinitis, unspecified: Secondary | ICD-10-CM

## 2023-12-02 DIAGNOSIS — G43001 Migraine without aura, not intractable, with status migrainosus: Secondary | ICD-10-CM

## 2023-12-02 DIAGNOSIS — G44219 Episodic tension-type headache, not intractable: Secondary | ICD-10-CM

## 2023-12-02 LAB — COMPREHENSIVE METABOLIC PANEL WITH GFR
ALT: 19 U/L (ref 0–35)
AST: 23 U/L (ref 0–37)
Albumin: 4.1 g/dL (ref 3.5–5.2)
Alkaline Phosphatase: 77 U/L (ref 39–117)
BUN: 12 mg/dL (ref 6–23)
CO2: 28 meq/L (ref 19–32)
Calcium: 8.5 mg/dL (ref 8.4–10.5)
Chloride: 102 meq/L (ref 96–112)
Creatinine, Ser: 0.71 mg/dL (ref 0.40–1.20)
GFR: 102.97 mL/min (ref >60.00)
Glucose, Bld: 105 mg/dL — ABNORMAL HIGH (ref 70–99)
Potassium: 4.1 meq/L (ref 3.5–5.1)
Sodium: 138 meq/L (ref 135–145)
Total Bilirubin: 0.4 mg/dL (ref 0.2–1.2)
Total Protein: 7.3 g/dL (ref 6.0–8.3)

## 2023-12-02 LAB — IBC PANEL
Iron: 86 ug/dL (ref 42–145)
Saturation Ratios: 18 % — ABNORMAL LOW (ref 20.0–50.0)
TIBC: 478.8 ug/dL — ABNORMAL HIGH (ref 250.0–450.0)
Transferrin: 342 mg/dL (ref 212.0–360.0)

## 2023-12-02 LAB — LIPID PANEL
Cholesterol: 163 mg/dL (ref 0–200)
HDL: 45.3 mg/dL (ref >39.00)
LDL Cholesterol: 83 mg/dL (ref 0–99)
NonHDL: 117.98
Total CHOL/HDL Ratio: 4
Triglycerides: 175 mg/dL — ABNORMAL HIGH (ref 0.0–149.0)
VLDL: 35 mg/dL (ref 0.0–40.0)

## 2023-12-02 LAB — HEMOGLOBIN A1C: Hgb A1c MFr Bld: 6.5 % (ref 4.6–6.5)

## 2023-12-02 LAB — CBC WITH DIFFERENTIAL/PLATELET
Basophils Absolute: 0 K/uL (ref 0.0–0.1)
Basophils Relative: 0.4 % (ref 0.0–3.0)
Eosinophils Absolute: 0.1 K/uL (ref 0.0–0.7)
Eosinophils Relative: 1.2 % (ref 0.0–5.0)
HCT: 36.5 % (ref 36.0–46.0)
Hemoglobin: 12 g/dL (ref 12.0–15.0)
Lymphocytes Relative: 35.9 % (ref 12.0–46.0)
Lymphs Abs: 2.4 K/uL (ref 0.7–4.0)
MCHC: 32.8 g/dL (ref 30.0–36.0)
MCV: 81.9 fl (ref 78.0–100.0)
Monocytes Absolute: 0.4 K/uL (ref 0.1–1.0)
Monocytes Relative: 6.3 % (ref 3.0–12.0)
Neutro Abs: 3.7 K/uL (ref 1.4–7.7)
Neutrophils Relative %: 56.2 % (ref 43.0–77.0)
Platelets: 250 K/uL (ref 150.0–400.0)
RBC: 4.46 Mil/uL (ref 3.87–5.11)
RDW: 16.2 % — ABNORMAL HIGH (ref 11.5–15.5)
WBC: 6.6 K/uL (ref 4.0–10.5)

## 2023-12-02 LAB — VITAMIN D 25 HYDROXY (VIT D DEFICIENCY, FRACTURES): VITD: 33.27 ng/mL (ref 30.00–100.00)

## 2023-12-02 LAB — FERRITIN: Ferritin: 11.4 ng/mL (ref 10.0–291.0)

## 2023-12-02 MED ORDER — METOPROLOL SUCCINATE ER 100 MG PO TB24: 100.0000 mg | ORAL_TABLET | Freq: Every day | ORAL | 3 refills | Status: AC

## 2023-12-02 MED ORDER — METFORMIN HCL ER 750 MG PO TB24: 750.0000 mg | ORAL_TABLET | Freq: Every day | ORAL | 3 refills | Status: AC

## 2023-12-02 MED ORDER — IRON (FERROUS SULFATE) 325 (65 FE) MG PO TABS
325.0000 mg | ORAL_TABLET | ORAL | Status: AC
Start: 2023-12-02 — End: ?

## 2023-12-02 MED ORDER — CYCLOBENZAPRINE HCL 10 MG PO TABS: 5.0000 mg | ORAL_TABLET | Freq: Two times a day (BID) | ORAL | 1 refills | Status: AC | PRN

## 2023-12-02 MED ORDER — FLUTICASONE PROPIONATE 50 MCG/ACT NA SUSP
2.0000 | Freq: Every day | NASAL | 4 refills | Status: AC
Start: 2023-12-02 — End: ?

## 2023-12-02 MED ORDER — MONTELUKAST SODIUM 10 MG PO TABS
10.0000 mg | ORAL_TABLET | Freq: Every day | ORAL | 3 refills | Status: AC
Start: 2023-12-02 — End: ?

## 2023-12-02 MED ORDER — OMEPRAZOLE 40 MG PO CPDR: 40.0000 mg | DELAYED_RELEASE_CAPSULE | Freq: Every day | ORAL | 3 refills | Status: DC

## 2023-12-02 MED ORDER — ROSUVASTATIN CALCIUM 10 MG PO TABS: 10.0000 mg | ORAL_TABLET | Freq: Every day | ORAL | 3 refills | Status: AC

## 2023-12-02 MED ORDER — BUPROPION HCL ER (XL) 300 MG PO TB24: 300.0000 mg | ORAL_TABLET | Freq: Every day | ORAL | 3 refills | Status: AC

## 2023-12-02 NOTE — Assessment & Plan Note (Signed)
 Chronic, update A1c on current regimen.

## 2023-12-02 NOTE — Assessment & Plan Note (Signed)
 Update levels on regular every other day OTC iron  dosing.

## 2023-12-02 NOTE — Assessment & Plan Note (Signed)
 Chronic, stable period on wellbutrin XL 300mg  daily - continue.

## 2023-12-02 NOTE — Assessment & Plan Note (Signed)
 Saw ENT  with reassuring eval - records requested to review.  Continue PPI

## 2023-12-02 NOTE — Assessment & Plan Note (Signed)
Discussed weight gain noted, encouraged healthy diet and lifestyle choices to affect sustainable weight loss.  

## 2023-12-02 NOTE — Assessment & Plan Note (Signed)
 Followed by endocrinology

## 2023-12-02 NOTE — Assessment & Plan Note (Signed)
Update iron levels.  

## 2023-12-02 NOTE — Assessment & Plan Note (Signed)
 Manages with flexeril  - refilled

## 2023-12-02 NOTE — Assessment & Plan Note (Signed)
 Severe fatty liver on MRI 06/2022.  LFTs normal. Update fib4 score when labs return.

## 2023-12-02 NOTE — Progress Notes (Signed)
 Ph: [phone] Fax: [phone]   Patient ID: Linda Welch, female    DOB: 1981-04-15, 43 y.o.   MRN: [id number]  This visit was conducted in person.  BP 124/82   Pulse 82   Temp 98.4 F (36.9 C) (Oral)   Ht 5' 2 (1.575 m)   Wt (!) 352 lb 9.6 oz (159.9 kg)   LMP 11/01/2023   SpO2 98%   BMI 64.49 kg/m    CC: CPE Subjective:   HPI: Linda Welch is a 44 y.o. female presenting on 12/02/2023 for Annual Exam   Husband passed away Nov 22, 2022.   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  PCOS on metformin  xr 750mg  nightly    OSA on CPAP pressure 16 mmH2O diagnosed ~16 yrs ago. This was previously followed by cardiology (last seen 2019).   Intermittent diarrhea related to food triggers - continues Seed pre/probiotic with benefit.  ESOPHAGOGASTRODUODENOSCOPY (EGD) WITH PROPOFOL  05/13/2022 - mild chronic gastritis on biopsy s/p empiric esophageal dilation Zane)   MAFLD on prior imaging (abd MRI) Unable to calculate Fibrosis 4 Score. Requires ALT, AST, and platelet count within the last year.  Sertraline  caused weight gain and diarrhea.  Was out of rosuvastatin  for a month, back on since 2023/11/22.   Chronic hoarseness - saw [facility] with reassuring direct laryngoscopy, no reflux changes.   Denies significant GERD symptoms - she does like spicy foods. Regularly takes omeprazole  40mg  daily.   Off and on lower back pain for 4+ yrs, acutely worse last year after caring for her husband (lots of lifting). Acutely worse in the past 6 wks. Worse pain with walking, going up steps. R sided lower lumbar spine without radiation to legs. No numbness or weakness of legs, no saddle anesthesia, fevers/chills, bowel/bladder accidents. No inciting trauma/injury or falls.   Uses flexeril  for tension headaches - requests refill.   Preventative: COLONOSCOPY WITH PROPOFOL  05/13/2022 - HP, minimal sigmoid diverticulosis, int hem  Zane)  GYN - trying to get in locally for the past year - on waiting list. All normal pap smears in the past. Last pap 2017. LMP 11/01/2023, [facility] - does breast exams at home. Trying Q2 yrs.  Flu shot yearly  COVID vaccine Pfizer 07/2019, 08/2019, booster 04/2020  Tdap 2013, Tdap 2014. Tdap today  Prevnar-13 2015, pneumovax-23 2020  Seat belt use discussed.  Sunscreen use discussed. No changing moles on skin.  Ex smoker - quit 2000  Alcohol - none - reaction to alcohol  Dentist yearly Eye exam yearly  Bladder - chronic mild stress incontinence.    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 Interim medical history since our last visit reviewed. Allergies and medications reviewed and updated. Outpatient Medications Prior to Visit  Medication Sig Dispense Refill   Cholecalciferol (VITAMIN D3) 5000 units TABS Take 5,000 Units by mouth daily.     levocetirizine (XYZAL) 5 MG tablet Take 5 mg by mouth every evening.     olopatadine (PATADAY) 0.1 % ophthalmic solution Place 1 drop into both eyes daily.     Probiotic Product (PROBIOTIC PO) Take 1 tablet by mouth daily. Seed Probiotic  SYNTHROID  200 MCG tablet Take 1 tablet (200 mcg total) by mouth daily before breakfast. 90 tablet 3   buPROPion  (WELLBUTRIN  XL) 300 MG 24 hr tablet Take 1 tablet (300 mg total) by mouth daily. 90 tablet 0   cyclobenzaprine  (FLEXERIL ) 10 MG tablet Take 0.5-1 tablets (5-10 mg total) by mouth 2 (two) times daily as needed (tension headache - with sedation precautions). 30 tablet 0   fluticasone  (FLONASE ) 50 MCG/ACT nasal spray Place 2 sprays into both nostrils daily. 48 g 3    metFORMIN  (GLUCOPHAGE -XR) 750 MG 24 hr tablet Take 1 tablet (750 mg total) by mouth at bedtime. 90 tablet 0   metoprolol  succinate (TOPROL -XL) 100 MG 24 hr tablet Take 1 tablet (100 mg total) by mouth daily. Take with or immediately following a meal. 90 tablet 0   montelukast  (SINGULAIR ) 10 MG tablet Take 1 tablet (10 mg total) by mouth daily. 90 tablet 0   omeprazole  (PRILOSEC) 40 MG capsule Take 1 capsule (40 mg total) by mouth daily. 90 capsule 0   rosuvastatin  (CRESTOR ) 10 MG tablet Take 1 tablet (10 mg total) by mouth daily. 90 tablet 0   No facility-administered medications prior to visit.     Per HPI unless specifically indicated in ROS section below Review of Systems  Constitutional:  Negative for activity change, appetite change, chills, fatigue, fever and unexpected weight change.  HENT:  Negative for hearing loss.   Eyes:  Negative for visual disturbance.  Respiratory:  Negative for cough, chest tightness, shortness of breath and wheezing.   Cardiovascular:  Positive for leg swelling (intermittent). Negative for chest pain and palpitations.  Gastrointestinal:  Positive for diarrhea (occ, food related). Negative for abdominal distention, abdominal pain, blood in stool, constipation, nausea and vomiting.  Genitourinary:  Negative for difficulty urinating and hematuria.  Musculoskeletal:  Negative for arthralgias, myalgias and neck pain.  Skin:  Negative for rash.  Neurological:  Negative for dizziness, seizures, syncope and headaches.  Hematological:  Negative for adenopathy. Does not bruise/bleed easily.  Psychiatric/Behavioral:  Negative for dysphoric mood. The patient is not nervous/anxious.     Objective:  BP 124/82   Pulse 82   Temp 98.4 F (36.9 C) (Oral)   Ht 5' 2 (1.575 m)   Wt (!) 352 lb 9.6 oz (159.9 kg)   LMP 11/01/2023   SpO2 98%   BMI 64.49 kg/m   Wt Readings from Last 3 Encounters:  12/02/23 (!) 352 lb 9.6 oz (159.9 kg)  07/13/23 (!) 345 lb (156.5 kg)   03/23/23 (!) 333 lb 4 oz (151.2 kg)      Physical Exam Vitals and nursing note reviewed.  Constitutional:      Appearance: Normal appearance. She is not ill-appearing.  HENT:     Head: Normocephalic and atraumatic.     Right Ear: Tympanic membrane, ear canal and external ear normal. There is no impacted cerumen.     Left Ear: Tympanic membrane, ear canal and external ear normal. There is no impacted cerumen.     Mouth/Throat:     Mouth: Mucous membranes are moist.     Pharynx: Oropharynx is clear. No oropharyngeal exudate or posterior oropharyngeal erythema.  Eyes:     General:        Right eye: No discharge.        Left eye: No discharge.     Extraocular Movements: Extraocular movements intact.     Conjunctiva/sclera: Conjunctivae normal.     Pupils: Pupils are equal, round,  and reactive to light.  Neck:     Thyroid : No thyroid  mass or thyromegaly.  Cardiovascular:     Rate and Rhythm: Normal rate and regular rhythm.     Pulses: Normal pulses.     Heart sounds: Normal heart sounds. No murmur heard. Pulmonary:     Effort: Pulmonary effort is normal. No respiratory distress.     Breath sounds: Normal breath sounds. No wheezing, rhonchi or rales.  Abdominal:     General: Bowel sounds are normal. There is no distension.     Palpations: Abdomen is soft. There is no mass.     Tenderness: There is no abdominal tenderness. There is no guarding or rebound.     Hernia: No hernia is present.  Musculoskeletal:     Cervical back: Normal range of motion and neck supple. No rigidity.     Right lower leg: No edema.     Left lower leg: No edema.  Lymphadenopathy:     Cervical: No cervical adenopathy.  Skin:    General: Skin is warm and dry.     Findings: No rash.  Neurological:     General: No focal deficit present.     Mental Status: She is alert. Mental status is at baseline.  Psychiatric:        Mood and Affect: Mood normal.        Behavior: Behavior normal.        Lab  Results  Component Value Date   CHOL 169 09/12/2022   HDL 40.50 09/12/2022   LDLCALC 102 (H) 09/12/2022   TRIG 128.0 09/12/2022   CHOLHDL 4 09/12/2022   Assessment & Plan:   Problem List Items Addressed This Visit     Encounter for general adult medical examination with abnormal findings - Primary (Chronic)   Preventative protocols reviewed and updated unless pt declined. Discussed healthy diet and lifestyle.       PCOS (polycystic ovarian syndrome)   On metformin , sees endo. Metformin  XR refilled.       Hemochromatosis carrier   Update iron  levels.       History of migraine   No recent migraines. Now gets more tension type headaches managed with flexeril       Relevant Medications   cyclobenzaprine  (FLEXERIL ) 10 MG tablet   OSA on CPAP   Continues CPAP therapy       MDD (major depressive disorder), recurrent episode, moderate (HCC)   Chronic, stable period on wellbutrin  XL 300mg  daily - continue       Relevant Medications   buPROPion  (WELLBUTRIN  XL) 300 MG 24 hr tablet   History of gestational diabetes   Increases risk of developing diabetes - update A1c.       Postsurgical hypothyroidism   Followed by endocrinology.       Relevant Medications   metoprolol  succinate (TOPROL -XL) 100 MG 24 hr tablet   Obesity, morbid, BMI 50 or higher (HCC)   Discussed weight gain noted, encouraged healthy diet and lifestyle choices to affect sustainable weight loss.       Relevant Medications   metFORMIN  (GLUCOPHAGE -XR) 750 MG 24 hr tablet   Chronic allergic rhinitis   Continue singulair , flonase , antihistamine.       Relevant Medications   fluticasone  (FLONASE ) 50 MCG/ACT nasal spray   montelukast  (SINGULAIR ) 10 MG tablet   Vitamin D  deficiency   Update levels on 5000 units OTC daily      Relevant Orders   VITAMIN D  25 Hydroxy (Vit-D Deficiency,  Fractures)   Hyperlipidemia   Chronic, update FLP on daily rosuvastatin . Update lipoprotein a in fmhx premature CAD   The 10-year ASCVD risk score (Arnett DK, et al., 2019) is: 0.9%   Values used to calculate the score:     Age: 47 years     Clincally relevant sex: Female     Is Non-Hispanic African American: No     Diabetic: No     Tobacco smoker: No     Systolic Blood Pressure: 124 mmHg     Is BP treated: No     HDL Cholesterol: 40.5 mg/dL     Total Cholesterol: 169 mg/dL       Relevant Medications   metoprolol  succinate (TOPROL -XL) 100 MG 24 hr tablet   rosuvastatin  (CRESTOR ) 10 MG tablet   TTH (tension-type headache)   Manages with flexeril  - refilled      Relevant Medications   buPROPion  (WELLBUTRIN  XL) 300 MG 24 hr tablet   cyclobenzaprine  (FLEXERIL ) 10 MG tablet   metoprolol  succinate (TOPROL -XL) 100 MG 24 hr tablet   Prediabetes   Chronic, update A1c on current regimen.       Relevant Medications   metFORMIN  (GLUCOPHAGE -XR) 750 MG 24 hr tablet   Other Relevant Orders   Hemoglobin A1c   Metabolic dysfunction-associated fatty liver disease (MAFLD)   Severe fatty liver on MRI 06/2022.  LFTs normal. Update fib4 score when labs return.       Family history of premature CAD   Iron  deficiency   Update levels on regular every other day OTC iron  dosing.       Relevant Orders   CBC with Differential/Platelet   Chronic hoarseness   Saw ENT  with reassuring eval - records requested to review.  Continue PPI      Chronic right-sided low back pain without sciatica   Chronic issue for 4+ years, acutely worse in the past year with caring for husband who has since passed away, again worse in the past 6 weeks.  Not consistent with radiculopathy/HNP - suspect paraspinous muscular strain vs scoliosis related in h/o same.  No red flags.  Will check baseline lumbar films, will refer to physical therapy.       Relevant Medications   buPROPion  (WELLBUTRIN  XL) 300 MG 24 hr tablet   cyclobenzaprine  (FLEXERIL ) 10 MG tablet   Other Relevant Orders   DG Lumbar Spine Complete   Ambulatory  referral to Physical Therapy   Other Visit Diagnoses       Need for Tdap vaccination       Relevant Orders   Tdap vaccine greater than or equal to 7yo IM (Completed)        Meds ordered this encounter  Medications   buPROPion  (WELLBUTRIN  XL) 300 MG 24 hr tablet    Sig: Take 1 tablet (300 mg total) by mouth daily.    Dispense:  90 tablet    Refill:  3   cyclobenzaprine  (FLEXERIL ) 10 MG tablet    Sig: Take 0.5-1 tablets (5-10 mg total) by mouth 2 (two) times daily as needed (tension headache - with sedation precautions).    Dispense:  30 tablet    Refill:  1   fluticasone  (FLONASE ) 50 MCG/ACT nasal spray    Sig: Place 2 sprays into both nostrils daily.    Dispense:  48 g    Refill:  4   metFORMIN  (GLUCOPHAGE -XR) 750 MG 24 hr tablet    Sig: Take 1 tablet (750 mg total) by  mouth at bedtime.    Dispense:  90 tablet    Refill:  3   metoprolol  succinate (TOPROL -XL) 100 MG 24 hr tablet    Sig: Take 1 tablet (100 mg total) by mouth daily. Take with or immediately following a meal.    Dispense:  90 tablet    Refill:  3   montelukast  (SINGULAIR ) 10 MG tablet    Sig: Take 1 tablet (10 mg total) by mouth daily.    Dispense:  90 tablet    Refill:  3   rosuvastatin  (CRESTOR ) 10 MG tablet    Sig: Take 1 tablet (10 mg total) by mouth daily.    Dispense:  90 tablet    Refill:  3   omeprazole  (PRILOSEC) 40 MG capsule    Sig: Take 1 capsule (40 mg total) by mouth daily.    Dispense:  90 capsule    Refill:  3   Iron , Ferrous Sulfate , 325 (65 Fe) MG TABS    Sig: Take 325 mg by mouth every Monday, Wednesday, and Friday.    Orders Placed This Encounter  Procedures   DG Lumbar Spine Complete    Standing Status:   Future    Expiration Date:   12/01/2024    Reason for Exam (SYMPTOM  OR DIAGNOSIS REQUIRED):   chronic R low back pain without radiculopathy    Is patient pregnant?:   No    Preferred imaging location?:   [facility]   Tdap vaccine greater than or equal to 7yo IM    Hemoglobin A1c   CBC with Differential/Platelet   VITAMIN D  25 Hydroxy (Vit-D Deficiency, Fractures)   Ambulatory referral to Physical Therapy    Referral Priority:   Routine    Referral Type:   Physical Medicine    Referral Reason:   Specialty Services Required    Requested Specialty:   Physical Therapy    Number of Visits Requested:   1    Patient Instructions  Update tetanus and whooping cough shot today.  Labs today  Xray of lumbar spine  - at outpatient imaging center May try flexeril  muscle relaxant to back, heating pad, hot shower.  I will refer you to physical therapy in [place].  Update me with how you're doing.  Check in with GYN about appointment. Call to schedule mammogram at your convenience: [facility] [phone].  We will request [facility] records  Good to see you today Return as needed or in 1 year for next physical  Follow up plan: Return in about 1 year (around 12/01/2024) for annual exam, prior fasting for blood work.  Anton Blas, MD

## 2023-12-02 NOTE — Patient Instructions (Addendum)
 Update tetanus and whooping cough shot today.  Labs today  Xray of lumbar spine  - at outpatient imaging center May try flexeril  muscle relaxant to back, heating pad, hot shower.  I will refer you to physical therapy in [place].  Update me with how you're doing.  Check in with GYN about appointment. Call to schedule mammogram at your convenience: [facility] [phone].  We will request [facility] records  Good to see you today Return as needed or in 1 year for next physical

## 2023-12-02 NOTE — Assessment & Plan Note (Signed)
 No recent migraines. Now gets more tension type headaches managed with flexeril 

## 2023-12-02 NOTE — Assessment & Plan Note (Signed)
 Chronic issue for 4+ years, acutely worse in the past year with caring for husband who has since passed away, again worse in the past 6 weeks.  Not consistent with radiculopathy/HNP - suspect paraspinous muscular strain vs scoliosis related in h/o same.  No red flags.  Will check baseline lumbar films, will refer to physical therapy.

## 2023-12-02 NOTE — Assessment & Plan Note (Signed)
 Increases risk of developing diabetes - update A1c.

## 2023-12-02 NOTE — Assessment & Plan Note (Signed)
 Continue singulair , flonase , antihistamine.

## 2023-12-02 NOTE — Assessment & Plan Note (Addendum)
 Chronic, update FLP on daily rosuvastatin . Update lipoprotein a in fmhx premature CAD  The 10-year ASCVD risk score (Arnett DK, et al., 2019) is: 0.9%   Values used to calculate the score:     Age: 43 years     Clincally relevant sex: Female     Is Non-Hispanic African American: No     Diabetic: No     Tobacco smoker: No     Systolic Blood Pressure: 124 mmHg     Is BP treated: No     HDL Cholesterol: 40.5 mg/dL     Total Cholesterol: 169 mg/dL

## 2023-12-02 NOTE — Assessment & Plan Note (Signed)
Continues CPAP therapy. 

## 2023-12-02 NOTE — Assessment & Plan Note (Signed)
 Update levels on 5000 units OTC daily

## 2023-12-02 NOTE — Assessment & Plan Note (Signed)
 Preventative protocols reviewed and updated unless pt declined. Discussed healthy diet and lifestyle.

## 2023-12-02 NOTE — Assessment & Plan Note (Signed)
 On metformin , sees endo. Metformin  XR refilled.

## 2023-12-04 LAB — LIPOPROTEIN A (LPA): Lipoprotein (a): <10 nmol/L (ref <75)

## 2023-12-07 ENCOUNTER — Ambulatory Visit: Payer: Self-pay | Admitting: Family Medicine

## 2023-12-07 DIAGNOSIS — M545 Low back pain, unspecified: Secondary | ICD-10-CM

## 2023-12-07 DIAGNOSIS — G8929 Other chronic pain: Secondary | ICD-10-CM

## 2023-12-07 DIAGNOSIS — Z8249 Family history of ischemic heart disease and other diseases of the circulatory system: Secondary | ICD-10-CM

## 2023-12-08 ENCOUNTER — Ambulatory Visit: Payer: Self-pay | Admitting: Internal Medicine

## 2023-12-24 ENCOUNTER — Ambulatory Visit (INDEPENDENT_AMBULATORY_CARE_PROVIDER_SITE_OTHER): Admitting: Internal Medicine

## 2023-12-24 ENCOUNTER — Encounter: Payer: Self-pay | Admitting: Internal Medicine

## 2023-12-24 VITALS — BP 120/62 | HR 90 | Ht 62.0 in | Wt 358.6 lb

## 2023-12-24 DIAGNOSIS — E890 Postprocedural hypothyroidism: Secondary | ICD-10-CM | POA: Diagnosis not present

## 2023-12-24 DIAGNOSIS — C73 Malignant neoplasm of thyroid gland: Secondary | ICD-10-CM | POA: Diagnosis not present

## 2023-12-24 NOTE — Patient Instructions (Signed)
Please stop at the lab.  Please continue Synthroid 185 mcg.  .Take the thyroid hormone every day, with water, at least 30 minutes before breakfast, separated by at least 4 hours from: - acid reflux medications - calcium - iron - multivitamins  Please come back for a follow-up appointment in 1 year.

## 2023-12-24 NOTE — Progress Notes (Addendum)
 Patient ID: Linda Welch, female   DOB: [birth year], 43 y.o.   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  Last visit 1 year ago.  Interim hx: She has palpitations (on Metoprolol ). No tremors but has chronic heat intolerance.  She gained 33 pounds since last visit. Earlier this mo she had a higher HbA1c >> she started to eliminated sugary and starchy foods.  She also just returned from the beach and feels that she retained a lot of fluid while there, from the heat. She still has hoarseness. Saw ENT >> presumed GERD-related. She has back pain >> recently referred to PCP.  Reviewed her thyroid  cancer history: - dx in in 02/2009 >> 3 cm nodule felt by pt >> seen by PCP >> referred to endo >> uptake and scan >> cold nodule >> FNA by Dr Clarissa: benign (bad experience).  - thyroid  U/S 2010: 3.1 x 2.6 x 2.2 cm complex, slightly hypoechoic, nodule, situated in the mid pole of the right thyroid  lobe - Thyroid  ultrasound 02/06/2014: Large nodule occupying most of the right lobe measures 5.2 x 3.2 x 4.1 cm. There are some internal calcifications. It previously measured 3.1 x 2.6 x 2.2 cm based on the prior report. - FNA 03/02/2014: FOLLICULAR NEOPLASM AND/OR LESION (BETHESDA IV)  - Right lobectomy 05/16/2014 and completion thyroidectomy 06/01/2014 Thyroid , lobectomy, right - FOLLICULAR VARIANT OF PAPILLARY THYROID  CARCINOMA WITH CAPSULAR INVASION, 4.2 CM, CONFINED WITHIN THYROIDAL TISSUE. - NO EVIDENCE OF ANGIOLYMPHATIC INVASION IDENTIFIED. - RESECTION MARGINS, NEGATIVE FOR ATYPIA OR MALIGNANCY. Microscopic Comment THYROID  Specimen: Right thyroid  Procedure: Right hemithyroidectomy Specimen Integrity (intact/fragmented): Intact Tumor focality: Unifocal Maximum tumor size (cm): 4.2 cm, gross description Tumor laterality: Right thyroid  gland Histologic type  (including subtype and/or unique features as applicable): Follicular variant of papillary thyroid  carcinoma Tumor capsule: Present Extrathyroidal extension: No Margins: Negative Lymph - Vascular invasion: Not identified Capsular invasion with degree of invasion if present: Present, complete Lymph nodes: # examined N/A; # positive; N/A TNM code: pT3 , pNX Non-neoplastic thyroid : Unremarkable. Due to the low risk of her thyroid  tumor, I did not recommend I-131 remnant ablation.  01/07/2016: Neck ultrasound:  Surgical changes of total thyroidectomy without evidence of residual thyroid  tissue, nodularity or lymphadenopathy.  Neck U/S (12/14/2019): Post total thyroidectomy without evidence of residual or locally recurrent disease.  Her thyroglobulin levels are fluctuating in the low normal range: Lab Results  Component Value Date   THYROGLB 0.3 (L) 12/08/2022   THYROGLB 0.2 (L) 12/05/2021   THYROGLB 0.3 (L) 12/06/2020   THYROGLB 0.4 (L) 12/02/2019   THYROGLB 0.2 (L) 06/22/2017   THYROGLB 0.3 (L) 12/22/2016   THYROGLB 0.6 (L) 12/24/2015   THGAB <1 12/08/2022   THGAB <1 12/05/2021   THGAB <1 12/06/2020   THGAB <1 12/02/2019   THGAB <1 06/22/2017   THGAB <1 12/22/2016   THGAB <1 12/24/2015   Pt denies: - feeling nodules in neck - dysphagia  - choking She still has some hoarseness. Es was stretched 04/2022.  Hypothyroidism: Pt. has been dx with hypothyroidism in 2010, now postoperative hypothyroidism; she is on brand-name Synthroid .  Pt is on Synthroid  200 mcg 6/7 days except 100 mcg 1/7 days (equivalent 185 mcg daily)-dose changed by PCP 08/2022: - in am - fasting - at least 30 min from b'fast - no Ca, Fe, MVI, + PPIs at night (for gastric ulcer reportedly) -  not on Biotin  Reviewed her TFTs: Lab Results  Component Value Date   TSH 0.96 12/08/2022   TSH 0.04 (L) 09/12/2022   TSH 1.46 12/05/2021   TSH 3.11 12/06/2020   TSH 6.06 (H) 07/25/2020   FREET4 1.11  12/08/2022   FREET4 1.00 12/05/2021   FREET4 0.92 12/06/2020   FREET4 1.02 07/25/2020   FREET4 1.02 12/02/2019  03/29/2013: TSH 0.93, fT4 1.03 02/04/2013 (towards end of pregnancy): TSH 0.12, fT4 0.96 >> Synthroid  reduced from 100 to 88 mcg 12/2012: TSH 0.07 >> Synthroid  reduced from 112 to 100 mcg  Vitamin D  insufficiency - now per PCP  She continues on 5000 units vitamin D  daily.  Reviewed vitamin D  levels: Lab Results  Component Value Date   VD25OH 33.27 12/02/2023   VD25OH 31.17 09/12/2022   VD25OH 34.98 09/10/2021   VD25OH 31.21 07/25/2020   VD25OH 31.0 12/02/2019   VD25OH 32.28 04/26/2019   VD25OH 32.43 01/11/2018   VD25OH 29.92 (L) 06/22/2017   VD25OH 34.67 12/22/2016   VD25OH 23.51 (L) 06/23/2016   In the past, patient complained of palpitations >> started fall 2017 after we last increased her LT4, but exacerbated 06/2016.  Of note, she had full cardiac investigation including a Holter monitor >> PVC and PACs (217 events per 48h).  On metoprolol .  She also has PCOS.  Reviewed latest HbA1c level: Lab Results  Component Value Date   HGBA1C 6.5 12/02/2023  She had diarrhea in the past and came off the metformin  transiently.  However, the diarrhea did not resolve so she restarted metformin .  ROS: + see HPI  Past Medical History:  Diagnosis Date   Allergy    Anxiety    Cancer (HCC)    thyroid  cancer - thyroidectomy January 2016   Chronic kidney disease    Complication of anesthesia    post operative myalgia   Depression    Difficult intubation    due to limited neck mobility   GERD (gastroesophageal reflux disease)    not taking Prilosec any longer   Hashimoto's thyroiditis 2010   History of bronchitis    History of influenza pneumonia 2012   History of kidney stones    lithotripsy   Migraine    migraines- usually x1 weekly   OSA on CPAP    cpap nightly-settings 16   Papillary carcinoma, follicular variant (HCC) 05/31/2014   PCOS (polycystic  ovarian syndrome) 2003   takes Metformin  for PCOS   Sleep apnea    Stress incontinence    Wears glasses    Past Surgical History:  Procedure Laterality Date   ACHILLES TENDON SURGERY N/A 01/29/2016   Procedure: RIGHT ACHILLES  RECONSTRUCTION;  Surgeon: Norleen Armor, MD;  Location: [facility];  Service: Orthopedics;  Laterality: N/A;   BIOPSY  05/13/2022   Procedure: BIOPSY;  Surgeon: Charlanne Groom, MD;  Location: [facility];  Service: Gastroenterology;;   COLONOSCOPY WITH PROPOFOL  N/A 05/13/2022   HP, minimal sigmoid diverticulosis, int hem Zane)   ESOPHAGOGASTRODUODENOSCOPY (EGD) WITH PROPOFOL  N/A 05/13/2022   mild chronic gastritis on biopsy s/p empiric esophageal dilation Zane)   EXTRACORPOREAL SHOCK WAVE LITHOTRIPSY Left    FRACTURE SURGERY     GASTROCNEMIUS RECESSION N/A 01/29/2016   Procedure: GASTROC RECESSION POSSIBLE FLEXIBLE FLEXOR HALGUS LONGUS TRANSER;  Surgeon: Norleen Armor, MD;  Location: [facility];  Service: Orthopedics;  Laterality: N/A;   MALONEY DILATION  05/13/2022   Procedure: AGAPITO DILATION;  Surgeon: Charlanne Groom, MD;  Location: [facility];  Service: Gastroenterology;;  POLYPECTOMY  05/13/2022   Procedure: POLYPECTOMY;  Surgeon: Charlanne Groom, MD;  Location: [facility];  Service: Gastroenterology;;   THYROID  LOBECTOMY Right 05/16/2014   Procedure: RIGHT THYROID  LOBECTOMY;  Surgeon: Krystal Spinner, MD;  Location: [facility];  Service: General;  Laterality: Right;   THYROIDECTOMY N/A 06/01/2014   Procedure: COMPLETION THYROIDECTOMY;  Surgeon: Krystal Spinner, MD;  Location: [facility];  Service: General;  Laterality: N/A;   TONSILECTOMY, ADENOIDECTOMY, BILATERAL MYRINGOTOMY AND TUBES     1984 and 1989   Social History   Socioeconomic History   Marital status: Widowed    Spouse name: Not on file   Number of children: 2   Years of education: Not on file   Highest education level: Some college, no degree  Occupational History   Not on file  Tobacco Use   Smoking status:  Former    Current packs/day: 0.00    Average packs/day: 0.3 packs/day for 2.0 years (0.5 ttl pk-yrs)    Types: Cigarettes    Start date: 05/26/1996    Quit date: 05/26/1998    Years since quitting: 25.5   Smokeless tobacco: Never   Tobacco comments:    quit smoking in 2000 - smoked for 2 years   Vaping Use   Vaping status: Never Used  Substance and Sexual Activity   Alcohol use: No   Drug use: No   Sexual activity: Not Currently    Birth control/protection: Abstinence, None  Other Topics Concern   Not on file  Social History Narrative   Divorced    Lives with 2nd husband and 2 boys (2006, 2014)   Occ: Building services engineer for [employer]   Edu: some college   Social Drivers of Corporate investment banker Strain: Low Risk  (12/02/2023)   Overall Financial Resource Strain (CARDIA)    Difficulty of Paying Living Expenses: Not very hard  Food Insecurity: No Food Insecurity (12/02/2023)   Hunger Vital Sign    Worried About Running Out of Food in the Last Year: Never true    Ran Out of Food in the Last Year: Never true  Transportation Needs: No Transportation Needs (12/02/2023)   PRAPARE - Administrator, Civil Service (Medical): No    Lack of Transportation (Non-Medical): No  Physical Activity: Insufficiently Active (12/02/2023)   Exercise Vital Sign    Days of Exercise per Week: 2 days    Minutes of Exercise per Session: 20 min  Stress: No Stress Concern Present (12/02/2023)   [employer] of Occupational Health - Occupational Stress Questionnaire    Feeling of Stress: Only a little  Social Connections: Moderately Integrated (12/02/2023)   Social Connection and Isolation Panel    Frequency of Communication with Friends and Family: More than three times a week    Frequency of Social Gatherings with Friends and Family: Once a week    Attends Religious Services: 1 to 4 times per year    Active Member of [employer] or Organizations: Yes    Attends Banker Meetings: 1 to 4  times per year    Marital Status: Widowed  Intimate Partner Violence: Not on file   Current Outpatient Medications on File Prior to Visit  Medication Sig Dispense Refill   buPROPion  (WELLBUTRIN  XL) 300 MG 24 hr tablet Take 1 tablet (300 mg total) by mouth daily. 90 tablet 3   Cholecalciferol (VITAMIN D3) 5000 units TABS Take 5,000 Units by mouth daily.     cyclobenzaprine  (FLEXERIL ) 10 MG  tablet Take 0.5-1 tablets (5-10 mg total) by mouth 2 (two) times daily as needed (tension headache - with sedation precautions). 30 tablet 1   fluticasone  (FLONASE ) 50 MCG/ACT nasal spray Place 2 sprays into both nostrils daily. 48 g 4   Iron , Ferrous Sulfate , 325 (65 Fe) MG TABS Take 325 mg by mouth every Monday, Wednesday, and Friday.     levocetirizine (XYZAL) 5 MG tablet Take 5 mg by mouth every evening.     metFORMIN  (GLUCOPHAGE -XR) 750 MG 24 hr tablet Take 1 tablet (750 mg total) by mouth at bedtime. 90 tablet 3   metoprolol  succinate (TOPROL -XL) 100 MG 24 hr tablet Take 1 tablet (100 mg total) by mouth daily. Take with or immediately following a meal. 90 tablet 3   montelukast  (SINGULAIR ) 10 MG tablet Take 1 tablet (10 mg total) by mouth daily. 90 tablet 3   olopatadine (PATADAY) 0.1 % ophthalmic solution Place 1 drop into both eyes daily.     omeprazole  (PRILOSEC) 40 MG capsule Take 1 capsule (40 mg total) by mouth daily. 90 capsule 3   Probiotic Product (PROBIOTIC PO) Take 1 tablet by mouth daily. Seed Probiotic     rosuvastatin  (CRESTOR ) 10 MG tablet Take 1 tablet (10 mg total) by mouth daily. 90 tablet 3   SYNTHROID  200 MCG tablet Take 1 tablet (200 mcg total) by mouth daily before breakfast. 90 tablet 3   No current facility-administered medications on file prior to visit.   Allergies  Allergen Reactions   Tioconazole Itching, Swelling and Other (See Comments)    Burning (severe)   Tape Hives and Swelling    Adhesive tapes   Buspar  [Buspirone ]     Worsening mood   Sertraline  Diarrhea     Significant diarrhea and stomach upset   Family History  Problem Relation Age of Onset   Thyroid  disease Mother        thyroid  goiter   Depression Mother    Hyperlipidemia Mother    Hypertension Mother    CAD Mother 75       triple bypass   Anxiety disorder Mother    Drug abuse Mother    Hearing loss Mother    Stroke Mother    Hyperlipidemia Father    Hypertension Father    Sleep apnea Father    CAD Father 2       MI, stent   Asthma Brother    Depression Brother    Heart disease Maternal Grandmother    Hyperlipidemia Maternal Grandmother    Arthritis Maternal Grandmother    Cancer Maternal Grandmother    Hearing loss Maternal Grandmother    Vision loss Maternal Grandmother    Heart disease Maternal Grandfather    Hyperlipidemia Maternal Grandfather    Heart disease Paternal Grandmother    Hyperlipidemia Paternal Grandmother    Heart disease Paternal Grandfather    Hyperlipidemia Paternal Grandfather    Diabetes Paternal Grandfather    Sleep apnea Paternal Grandfather    ADD / ADHD Son    Colon cancer Neg Hx    Rectal cancer Neg Hx    Stomach cancer Neg Hx    PE: BP 120/62   Pulse 90   Ht 5' 2 (1.575 m)   Wt (!) 358 lb 9.6 oz (162.7 kg)   LMP 11/01/2023   SpO2 96%   BMI 65.59 kg/m  Wt Readings from Last 10 Encounters:  12/24/23 (!) 358 lb 9.6 oz (162.7 kg)  12/02/23 (!) 352 lb 9.6  oz (159.9 kg)  07/13/23 (!) 345 lb (156.5 kg)  03/23/23 (!) 333 lb 4 oz (151.2 kg)  02/25/23 (!) 320 lb (145.2 kg)  12/08/22 (!) 325 lb 3.2 oz (147.5 kg)  09/19/22 (!) 330 lb (149.7 kg)  05/30/22 (!) 342 lb 8 oz (155.4 kg)  05/15/22 (!) 340 lb (154.2 kg)  05/13/22 (!) 340 lb (154.2 kg)   Constitutional: overweight, in NAD Eyes: EOMI, no exophthalmos ENT:no neck masses palpated, no cervical lymphadenopathy Cardiovascular: RRR, No MRG Respiratory: CTA B Musculoskeletal: no deformities Skin: no rashes Neurological: no tremor with outstretched hands  ASSESSMENT: 1.  Follicular variant of PTC  2. Postsurgical hypothyroidism  PLAN:  1. PTC - Patient with follicular variant of papillary thyroid  cancer, encapsulated, not spreading outside the thyroid .  She is stage I TNM, with good prognosis. -Latest neck ultrasound was obtained in 2021 and did not show any recurrences or suspicious masses in her neck.  We will repeat the ultrasound now, 4 years after the previous -Since she did not have RAI ablation, her thyroglobulin levels are not completely undetectable.  Latest was 0.3, approximately stable, with undetectable ATA antibodies.  We will repeat these today.  We did discuss that in the absence of RAI treatment, thyroglobulin levels can remain detectable and further investigation is only needed when there is a clear trend up. -No neck compression symptoms at today's visit.  She continues to have hoarseness, for which she saw ENT and this was considered most likely related to acid reflux.  She continues on a PPI. She had her esophagus stretched in 04/2022. - I will see her back in a year, or sooner, depending on the results of the above tests  2. Hypothyroidism - latest thyroid  labs reviewed with pt. >> normal: Lab Results  Component Value Date   TSH 0.96 12/08/2022  - she continues on LT4 200 mcg 6/7 days and 100 mcg 1/7 days (equivalent daily dose of 185 mcg) - pt feels good on this dose.  She continues to have heat intolerance.  She also has longstanding palpitations.  She lost 10 pounds before last visit reportedly, but she gained 33 pounds since then.  We discussed that this may increase her LT4 requirements. - we discussed about taking the thyroid  hormone every day, with water, >30 minutes before breakfast, separated by >4 hours from acid reflux medications, calcium , iron , multivitamins. Pt. is taking it correctly. - will check thyroid  tests today: TSH and fT4 - If labs are abnormal, she will need to return for repeat TFTs in 1.5 months  Orders Placed  This Encounter  Procedures   US  THYROID    TSH   T4, free   Thyroglobulin antibody   Thyroglobulin Level   Component     Latest Ref Rng 12/24/2023  TSH     mIU/L 0.58   T4,Free(Direct)     0.8 - 1.8 ng/dL 1.6   TFTs are at goal -will continue the same dose of Synthroid .  Component     Latest Ref Rng 12/24/2023  Thyroglobulin     ng/mL 0.1 (L)   Comment -   Thyroglobulin Ab     < or = 1 IU/mL <1   Tg is lower!  Neck U/S (12/28/2023): The thyroid  gland is surgically absent. No evidence of residual tissue in the thyroid  resection bed. No evidence of new nodules or lymphadenopathy.   IMPRESSION: Surgical changes of total thyroidectomy without evidence of recurrent disease.  Lela Fendt, MD PhD [facility] Endocrinology

## 2023-12-25 ENCOUNTER — Ambulatory Visit: Payer: Self-pay | Admitting: Internal Medicine

## 2023-12-25 LAB — TSH: TSH: 0.58 m[IU]/L

## 2023-12-25 LAB — THYROGLOBULIN ANTIBODY: Thyroglobulin Ab: <1 [IU]/mL (ref <=1)

## 2023-12-25 LAB — T4, FREE: Free T4: 1.6 ng/dL (ref 0.8–1.8)

## 2023-12-25 LAB — THYROGLOBULIN LEVEL: Thyroglobulin: 0.1 ng/mL — ABNORMAL LOW

## 2023-12-28 ENCOUNTER — Ambulatory Visit
Admission: RE | Admit: 2023-12-28 | Discharge: 2023-12-28 | Disposition: A | Discharge status: Home / Self Care | Source: Ambulatory Visit | Attending: Internal Medicine | Admitting: Internal Medicine

## 2023-12-28 ENCOUNTER — Ambulatory Visit
Admission: RE | Admit: 2023-12-28 | Discharge: 2023-12-28 | Disposition: A | Discharge status: Home / Self Care | Source: Ambulatory Visit | Attending: Family Medicine | Admitting: Family Medicine

## 2023-12-28 DIAGNOSIS — M545 Low back pain, unspecified: Secondary | ICD-10-CM

## 2023-12-28 DIAGNOSIS — C73 Malignant neoplasm of thyroid gland: Secondary | ICD-10-CM

## 2023-12-28 MED ORDER — SYNTHROID 200 MCG PO TABS: 200.0000 ug | ORAL_TABLET | Freq: Every day | ORAL | 3 refills | Status: DC

## 2024-01-18 ENCOUNTER — Encounter: Payer: Self-pay | Admitting: Internal Medicine

## 2024-01-18 MED ORDER — SYNTHROID 200 MCG PO TABS: 200.0000 ug | ORAL_TABLET | Freq: Every day | ORAL | 3 refills | Status: AC

## 2024-06-01 ENCOUNTER — Telehealth: Payer: Self-pay | Admitting: Family Medicine

## 2024-06-01 NOTE — Telephone Encounter (Signed)
 Copied from CRM [id number]. Topic: Clinical - Refused Triage >> Jun 01, 2024 11:52 AM Revonda D wrote: Patient/caller voiced complaints of trouble getting voice out (hoarseness) cough, and feels like something is in her lungs and has to keep trying to cough to get it out. Declined transfer to triage.

## 2024-06-01 NOTE — Telephone Encounter (Signed)
 Copied from CRM [id number]. Topic: Clinical - Refused Triage >> Jun 01, 2024 11:52 AM Revonda D wrote: Patient/caller voiced complaints of trouble getting voice out (hoarseness) cough, and feels like something is in her lungs and has to keep trying to cough to get it out. Declined transfer to triage.   Patient has appointment 1-9 with Tabitha.

## 2024-06-02 NOTE — Telephone Encounter (Signed)
 Appreciate Tabitha seeing this nice patient.

## 2024-06-03 ENCOUNTER — Encounter: Payer: Self-pay | Admitting: Family

## 2024-06-03 ENCOUNTER — Ambulatory Visit (INDEPENDENT_AMBULATORY_CARE_PROVIDER_SITE_OTHER): Admitting: Family

## 2024-06-03 VITALS — BP 126/80 | HR 79 | Temp 98.0°F | Ht 62.0 in | Wt 350.4 lb

## 2024-06-03 DIAGNOSIS — B3731 Acute candidiasis of vulva and vagina: Secondary | ICD-10-CM

## 2024-06-03 DIAGNOSIS — J040 Acute laryngitis: Secondary | ICD-10-CM | POA: Diagnosis not present

## 2024-06-03 DIAGNOSIS — K219 Gastro-esophageal reflux disease without esophagitis: Secondary | ICD-10-CM | POA: Insufficient documentation

## 2024-06-03 MED ORDER — PANTOPRAZOLE SODIUM 40 MG PO TBEC: 40.0000 mg | DELAYED_RELEASE_TABLET | Freq: Every day | ORAL | 1 refills | Status: AC

## 2024-06-03 MED ORDER — AMOXICILLIN-POT CLAVULANATE 875-125 MG PO TABS: 1.0000 | ORAL_TABLET | Freq: Two times a day (BID) | ORAL | 0 refills | Status: AC

## 2024-06-03 MED ORDER — FLUCONAZOLE 150 MG PO TABS: 150.0000 mg | ORAL_TABLET | Freq: Once | ORAL | 0 refills | Status: AC

## 2024-06-03 NOTE — Progress Notes (Signed)
 ti  Established Patient Office Visit  Subjective:     CC:  Chief Complaint  Patient presents with   Acute Visit    Had the flu over Christmas. Still having issues with hoarseness and throat congestion.    HPI: Linda Welch is a 44 y.o. female presenting on 06/03/2024 for Acute Visit (Had the flu over Christmas. Still having issues with hoarseness and throat congestion.) .  Discussed the use of AI scribe software for clinical note transcription with the patient, who gave verbal consent to proceed.  History of Present Illness Linda Welch is a 44 year old female with allergic rhinitis and GERD who presents with chest tightness and chronic hoarseness.  She experiences chest tightness and difficulty projecting her voice, requiring significant effort to speak. The chest tightness began after a flu-like illness over Christmas, confirmed by a home test, with a fever of 103.27F that resolved over a week ago. She continues to have a cough that feels like there is something in her lungs that needs to be expelled, but she is unable to cough anything up. No sore throat, ear pain, or sinus congestion.  She has a history of chronic hoarseness and silent reflux. An ENT evaluation in 2024 showed erythema and edema of the vocal cords. She takes Prilosec daily for silent reflux, diagnosed two years ago after a GI evaluation found a small spot of gastritis. Her medication regimen has remained unchanged.  She is on Singulair  for significant allergic rhinitis and denies having asthma. She has a history of ear tenderness, described as a chronic issue, with no recent changes in symptoms.           Social history:  Relevant past medical, surgical, family and social history reviewed and updated as indicated. Interim medical history since our last visit reviewed.  Allergies and medications reviewed and updated.  DATA REVIEWED: CHART IN EPIC     ROS: Negative unless specifically indicated  above in HPI.   Current Medications[1]        Objective:        BP 126/80 (BP Location: Left Arm, Patient Position: Sitting, Cuff Size: Large)   Pulse 79   Temp 98 F (36.7 C) (Temporal)   Ht 5' 2 (1.575 m)   Wt (!) 350 lb 6.4 oz (158.9 kg)   LMP 05/21/2024 (Exact Date)   SpO2 98%   BMI 64.09 kg/m   Physical Exam HEENT: No sinus tenderness. CHEST: No wheezing on auscultation.  Wt Readings from Last 3 Encounters:  06/03/24 (!) 350 lb 6.4 oz (158.9 kg)  12/24/23 (!) 358 lb 9.6 oz (162.7 kg)  12/02/23 (!) 352 lb 9.6 oz (159.9 kg)    Physical Exam Constitutional:      General: She is not in acute distress.    Appearance: Normal appearance. She is normal weight. She is not ill-appearing, toxic-appearing or diaphoretic.  HENT:     Head: Normocephalic.     Right Ear: Tympanic membrane normal.     Left Ear: Tympanic membrane normal.     Nose: Nose normal.     Mouth/Throat:     Mouth: Mucous membranes are dry.     Pharynx: No oropharyngeal exudate or posterior oropharyngeal erythema.  Eyes:     Extraocular Movements: Extraocular movements intact.     Pupils: Pupils are equal, round, and reactive to light.  Cardiovascular:     Rate and Rhythm: Normal rate and regular rhythm.     Pulses: Normal pulses.  Heart sounds: Normal heart sounds.  Pulmonary:     Effort: Pulmonary effort is normal.     Breath sounds: Normal breath sounds.  Musculoskeletal:     Cervical back: Normal range of motion.  Neurological:     General: No focal deficit present.     Mental Status: She is alert and oriented to person, place, and time. Mental status is at baseline.  Psychiatric:        Mood and Affect: Mood normal.        Behavior: Behavior normal.        Thought Content: Thought content normal.        Judgment: Judgment normal.          Results Diagnostic Laryngoscopy (2024): Erythema and edema of the vocal folds consistent with reflux changes; no additional  abnormalities Upper endoscopy (2024): Small area of gastritis  Assessment & Plan:   Assessment and Plan Assessment & Plan Acute laryngitis Persistent hoarseness and chest tightness following a flu-like illness over Christmas. No sore throat, ear pain, or sinus congestion. Previous ENT evaluation showed erythema and edema consistent with reflux. No wheezing on examination. Differential includes silent reflux exacerbating symptoms. - Advised taking Diflucan  to prevent antibiotic-associated yeast infection. -rx augmentin  875/125 mg po bid x 10 days  Gastroesophageal reflux disease Chronic GERD with silent reflux. Currently on Prilosec daily. Previous GI evaluation showed gastritis. Change omeprazole  to pantoprazole   -Try to decrease and or avoid spicy foods, fried fatty foods, and also caffeine and chocolate as these can increase heartburn symptoms.    Acute vulvovaginal candidiasis Recurrent yeast infections associated with antibiotic use. - Prescribed Diflucan  to prevent yeast infection during antibiotic treatment.        Return for f/u PCP if no improvement in symptoms.     Ginger Patrick, MSN, APRN, FNP-C [facility] Medicine        [1]  Current Outpatient Medications:    buPROPion  (WELLBUTRIN  XL) 300 MG 24 hr tablet, Take 1 tablet (300 mg total) by mouth daily., Disp: 90 tablet, Rfl: 3   Cholecalciferol (VITAMIN D3) 5000 units TABS, Take 5,000 Units by mouth daily., Disp: , Rfl:    cyclobenzaprine  (FLEXERIL ) 10 MG tablet, Take 0.5-1 tablets (5-10 mg total) by mouth 2 (two) times daily as needed (tension headache - with sedation precautions)., Disp: 30 tablet, Rfl: 1   fluticasone  (FLONASE ) 50 MCG/ACT nasal spray, Place 2 sprays into both nostrils daily., Disp: 48 g, Rfl: 4   Iron , Ferrous Sulfate , 325 (65 Fe) MG TABS, Take 325 mg by mouth every Monday, Wednesday, and Friday., Disp: , Rfl:    levocetirizine (XYZAL) 5 MG tablet, Take 5 mg by mouth every  evening., Disp: , Rfl:    metFORMIN  (GLUCOPHAGE -XR) 750 MG 24 hr tablet, Take 1 tablet (750 mg total) by mouth at bedtime., Disp: 90 tablet, Rfl: 3   metoprolol  succinate (TOPROL -XL) 100 MG 24 hr tablet, Take 1 tablet (100 mg total) by mouth daily. Take with or immediately following a meal., Disp: 90 tablet, Rfl: 3   montelukast  (SINGULAIR ) 10 MG tablet, Take 1 tablet (10 mg total) by mouth daily., Disp: 90 tablet, Rfl: 3   olopatadine (PATADAY) 0.1 % ophthalmic solution, Place 1 drop into both eyes daily., Disp: , Rfl:    Probiotic Product (PROBIOTIC PO), Take 1 tablet by mouth daily. Seed Probiotic, Disp: , Rfl:    rosuvastatin  (CRESTOR ) 10 MG tablet, Take 1 tablet (10 mg total) by mouth daily., Disp: 90  tablet, Rfl: 3   SYNTHROID  200 MCG tablet, Take 1 tablet (200 mcg total) by mouth daily before breakfast., Disp: 90 tablet, Rfl: 3   amoxicillin -clavulanate (AUGMENTIN ) 875-125 MG tablet, Take 1 tablet by mouth 2 (two) times daily., Disp: 20 tablet, Rfl: 0   fluconazole  (DIFLUCAN ) 150 MG tablet, Take 1 tablet (150 mg total) by mouth once for 1 dose., Disp: 1 tablet, Rfl: 0   pantoprazole  (PROTONIX ) 40 MG tablet, Take 1 tablet (40 mg total) by mouth daily., Disp: 90 tablet, Rfl: 1

## 2024-06-07 ENCOUNTER — Telehealth: Payer: Self-pay | Admitting: Family Medicine

## 2024-06-07 NOTE — Telephone Encounter (Signed)
 Received request from [facility] for CPAP supply refill. Form filled and placed in CMA box.  Pt may need OV for CPAP compliance monitoring if we receive request for updated office visit.

## 2024-06-09 NOTE — Telephone Encounter (Signed)
 Forms have been faxed, will schedule if updated visit is requested

## 2024-06-14 ENCOUNTER — Encounter: Payer: Self-pay | Admitting: Family

## 2024-06-14 DIAGNOSIS — B3731 Acute candidiasis of vulva and vagina: Secondary | ICD-10-CM

## 2024-06-14 MED ORDER — FLUCONAZOLE 150 MG PO TABS: ORAL_TABLET | ORAL | 0 refills | Status: AC

## 2024-06-14 MED ORDER — FLUCONAZOLE 150 MG PO TABS: ORAL_TABLET | ORAL | 0 refills | Status: DC

## 2024-11-24 ENCOUNTER — Other Ambulatory Visit

## 2024-12-02 ENCOUNTER — Encounter: Admitting: Family Medicine

## 2024-12-23 ENCOUNTER — Ambulatory Visit: Admitting: Internal Medicine
